# Patient Record
Sex: Female | Born: 1935 | Race: White | Hispanic: No | Marital: Married | Smoking: Never smoker
Health system: Southern US, Community
[De-identification: ages and names within clinical notes are randomized; demographics above are authoritative.]

## PROBLEM LIST (undated history)

## (undated) DIAGNOSIS — I10 Essential (primary) hypertension: Secondary | ICD-10-CM

## (undated) DIAGNOSIS — K635 Polyp of colon: Secondary | ICD-10-CM

## (undated) DIAGNOSIS — F410 Panic disorder [episodic paroxysmal anxiety] without agoraphobia: Secondary | ICD-10-CM

## (undated) DIAGNOSIS — E785 Hyperlipidemia, unspecified: Secondary | ICD-10-CM

## (undated) DIAGNOSIS — Z5189 Encounter for other specified aftercare: Secondary | ICD-10-CM

## (undated) DIAGNOSIS — K219 Gastro-esophageal reflux disease without esophagitis: Secondary | ICD-10-CM

## (undated) DIAGNOSIS — S060XAA Concussion with loss of consciousness status unknown, initial encounter: Secondary | ICD-10-CM

## (undated) DIAGNOSIS — M1990 Unspecified osteoarthritis, unspecified site: Secondary | ICD-10-CM

## (undated) DIAGNOSIS — T8859XA Other complications of anesthesia, initial encounter: Secondary | ICD-10-CM

## (undated) DIAGNOSIS — E119 Type 2 diabetes mellitus without complications: Secondary | ICD-10-CM

## (undated) DIAGNOSIS — Z9889 Other specified postprocedural states: Secondary | ICD-10-CM

## (undated) DIAGNOSIS — R112 Nausea with vomiting, unspecified: Secondary | ICD-10-CM

## (undated) DIAGNOSIS — S060X9A Concussion with loss of consciousness of unspecified duration, initial encounter: Secondary | ICD-10-CM

## (undated) DIAGNOSIS — K449 Diaphragmatic hernia without obstruction or gangrene: Secondary | ICD-10-CM

## (undated) DIAGNOSIS — T4145XA Adverse effect of unspecified anesthetic, initial encounter: Secondary | ICD-10-CM

## (undated) DIAGNOSIS — IMO0001 Reserved for inherently not codable concepts without codable children: Secondary | ICD-10-CM

## (undated) DIAGNOSIS — I82409 Acute embolism and thrombosis of unspecified deep veins of unspecified lower extremity: Secondary | ICD-10-CM

## (undated) HISTORY — PX: CT SCAN: SHX5351

## (undated) HISTORY — DX: Diaphragmatic hernia without obstruction or gangrene: K44.9

## (undated) HISTORY — PX: ESOPHAGOGASTRODUODENOSCOPY: SHX1529

## (undated) HISTORY — DX: Encounter for other specified aftercare: Z51.89

## (undated) HISTORY — DX: Gastro-esophageal reflux disease without esophagitis: K21.9

## (undated) HISTORY — DX: Polyp of colon: K63.5

## (undated) HISTORY — PX: COLONOSCOPY: SHX174

## (undated) HISTORY — PX: OTHER SURGICAL HISTORY: SHX169

## (undated) HISTORY — PX: BREAST EXCISIONAL BIOPSY: SUR124

## (undated) HISTORY — DX: Concussion with loss of consciousness of unspecified duration, initial encounter: S06.0X9A

## (undated) HISTORY — DX: Reserved for inherently not codable concepts without codable children: IMO0001

## (undated) HISTORY — DX: Essential (primary) hypertension: I10

## (undated) HISTORY — DX: Type 2 diabetes mellitus without complications: E11.9

## (undated) HISTORY — PX: SHOULDER SURGERY: SHX246

## (undated) HISTORY — DX: Acute embolism and thrombosis of unspecified deep veins of unspecified lower extremity: I82.409

## (undated) HISTORY — DX: Concussion with loss of consciousness status unknown, initial encounter: S06.0XAA

## (undated) HISTORY — DX: Unspecified osteoarthritis, unspecified site: M19.90

## (undated) HISTORY — PX: DILATION AND CURETTAGE OF UTERUS: SHX78

## (undated) HISTORY — DX: Panic disorder (episodic paroxysmal anxiety): F41.0

## (undated) HISTORY — DX: Hyperlipidemia, unspecified: E78.5

---
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
## 1964-02-10 HISTORY — PX: BREAST MASS EXCISION: SHX1267

## 1974-02-09 HISTORY — PX: CATARACT EXTRACTION W/ INTRAOCULAR LENS  IMPLANT, BILATERAL: SHX1307

## 1977-02-09 HISTORY — PX: KNEE SURGERY: SHX244

## 1977-02-09 HISTORY — PX: CHOLECYSTECTOMY: SHX55

## 1982-02-09 HISTORY — PX: TUBAL LIGATION: SHX77

## 1985-02-09 HISTORY — PX: ABDOMINAL HYSTERECTOMY: SHX81

## 1987-02-10 HISTORY — PX: ENUCLEATION: SHX628

## 1995-02-10 HISTORY — PX: WRIST FRACTURE SURGERY: SHX121

## 2000-02-10 HISTORY — PX: FOOT SURGERY: SHX648

## 2001-06-27 ENCOUNTER — Encounter: Payer: Self-pay | Admitting: Orthopedic Surgery

## 2001-06-27 ENCOUNTER — Ambulatory Visit (HOSPITAL_COMMUNITY): Admission: RE | Admit: 2001-06-27 | Discharge: 2001-06-27 | Payer: Self-pay | Admitting: Orthopedic Surgery

## 2001-06-30 ENCOUNTER — Encounter: Payer: Self-pay | Admitting: Orthopedic Surgery

## 2001-06-30 ENCOUNTER — Ambulatory Visit (HOSPITAL_COMMUNITY): Admission: RE | Admit: 2001-06-30 | Discharge: 2001-06-30 | Payer: Self-pay | Admitting: Orthopedic Surgery

## 2001-08-04 ENCOUNTER — Encounter: Admission: RE | Admit: 2001-08-04 | Discharge: 2001-08-04 | Payer: Self-pay | Admitting: Unknown Physician Specialty

## 2001-08-04 ENCOUNTER — Encounter: Payer: Self-pay | Admitting: Unknown Physician Specialty

## 2002-09-04 ENCOUNTER — Encounter: Admission: RE | Admit: 2002-09-04 | Discharge: 2002-09-04 | Payer: Self-pay | Admitting: Unknown Physician Specialty

## 2002-09-04 ENCOUNTER — Encounter: Payer: Self-pay | Admitting: Unknown Physician Specialty

## 2003-02-10 HISTORY — PX: OTHER SURGICAL HISTORY: SHX169

## 2003-06-20 ENCOUNTER — Ambulatory Visit (HOSPITAL_COMMUNITY): Admission: RE | Admit: 2003-06-20 | Discharge: 2003-06-20 | Payer: Self-pay | Admitting: Orthopedic Surgery

## 2003-07-10 ENCOUNTER — Ambulatory Visit (HOSPITAL_BASED_OUTPATIENT_CLINIC_OR_DEPARTMENT_OTHER): Admission: RE | Admit: 2003-07-10 | Discharge: 2003-07-10 | Payer: Self-pay | Admitting: Orthopedic Surgery

## 2003-09-10 ENCOUNTER — Encounter: Admission: RE | Admit: 2003-09-10 | Discharge: 2003-09-10 | Payer: Self-pay | Admitting: Unknown Physician Specialty

## 2003-11-10 HISTORY — PX: MOHS SURGERY: SUR867

## 2004-09-17 ENCOUNTER — Encounter: Admission: RE | Admit: 2004-09-17 | Discharge: 2004-09-17 | Payer: Self-pay | Admitting: Unknown Physician Specialty

## 2005-09-18 ENCOUNTER — Encounter: Admission: RE | Admit: 2005-09-18 | Discharge: 2005-09-18 | Payer: Self-pay | Admitting: Unknown Physician Specialty

## 2006-07-30 ENCOUNTER — Ambulatory Visit (HOSPITAL_COMMUNITY): Admission: RE | Admit: 2006-07-30 | Discharge: 2006-07-30 | Payer: Self-pay | Admitting: Family Medicine

## 2006-08-10 HISTORY — PX: OTHER SURGICAL HISTORY: SHX169

## 2006-08-11 ENCOUNTER — Ambulatory Visit (HOSPITAL_COMMUNITY): Admission: RE | Admit: 2006-08-11 | Discharge: 2006-08-11 | Payer: Self-pay | Admitting: Family Medicine

## 2006-09-20 ENCOUNTER — Encounter: Admission: RE | Admit: 2006-09-20 | Discharge: 2006-09-20 | Payer: Self-pay | Admitting: Family Medicine

## 2007-09-21 ENCOUNTER — Encounter: Admission: RE | Admit: 2007-09-21 | Discharge: 2007-09-21 | Payer: Self-pay | Admitting: Family Medicine

## 2008-09-21 ENCOUNTER — Encounter: Admission: RE | Admit: 2008-09-21 | Discharge: 2008-09-21 | Payer: Self-pay | Admitting: Family Medicine

## 2009-11-06 ENCOUNTER — Encounter: Admission: RE | Admit: 2009-11-06 | Discharge: 2009-11-06 | Payer: Self-pay | Admitting: Obstetrics and Gynecology

## 2010-03-02 ENCOUNTER — Encounter: Payer: Self-pay | Admitting: Orthopedic Surgery

## 2010-03-12 ENCOUNTER — Encounter: Payer: Self-pay | Admitting: Family Medicine

## 2010-06-27 NOTE — Op Note (Signed)
NAME:  Brianna Burns, Brianna Burns                         ACCOUNT NO.:  [id number]   MEDICAL RECORD NO.:  [id number]                   PATIENT TYPE:  AMB   LOCATION:  DSC                                  FACILITY:  [facility]   PHYSICIAN:  Katy Fitch. Naaman Plummer., M.D.          DATE OF BIRTH:  August 20, 1934   DATE OF PROCEDURE:  07/09/2003  DATE OF DISCHARGE:                                 OPERATIVE REPORT   PREOPERATIVE DIAGNOSIS:  Chronic pain, left shoulder due to adhesive  capsulitis.   POSTOPERATIVE DIAGNOSIS:  Chronic pain, left shoulder due to adhesive  capsulitis.   OPERATION PERFORMED:  Examination of left shoulder under anesthesia followed  by capsulolysis by manipulation and injection of 2% lidocaine and Depo-  Medrol 40 mg/mL, total of 4 mL mixed 3 lidocaine to 1 Depo-Medrol.   SURGEON:  Katy Fitch. Sypher, M.D.   ANESTHESIA:  General by mask.   SUPERVISING ANESTHESIOLOGIST:  Dr. Ivin Booty.   INDICATIONS FOR PROCEDURE:  Brianna Burns is a 76 year old woman referred by  Colon Flattery, D.O. for evaluation and management of a painful left shoulder.  She was an established patient with [facility].  She  presented with predicament of a chronically painful and stiff left shoulder.  Clinical examination revealed signs of adhesive capsulitis and impingement.  An MRI of the shoulder documented an intact rotator cuff with chronic  adhesive capsulitis.  Due to failure to respond to nonoperative measures,  the patient is brought to the operating room at this time anticipating  manipulation of her shoulder and injection.  Prior to surgery questions were  invited and answered.   DESCRIPTION OF PROCEDURE:  Brianna Burns was brought to the operating room  and placed in supine position on the operating table.  Following induction  of general anesthesia with IV technique followed by inhalation anesthesia  with a mask, the left shoulder was examined under anesthesia.  She could  elevated  approximately 125 degrees, externally rotate it approximately 40  degrees and internally rotate 20 degrees.  With gentle manipulation, her  capsule was lysed with recovery of elevation 175, external rotation at 90  degrees abduction of 95 degrees and internal rotation at 90 degrees  abduction of 85 degrees.  Her glenohumeral joint was stable.  The joint was  then injected with a mixture of 3 mL of 2% plain lidocaine and Depo-Medrol  40 mg per mL.  This was injected through an anterior approach medial to the  long head of the biceps.  3 mL were injected within the joint and 1 mL in  the subacromial space.  There were no apparent complications.  Brianna Burns was  awakened from anesthesia and transferred to recovery room with stable vital  signs.   She will be discharged to home with prescriptions for Darvocet-N 100 24  tablets one by mouth every four to six hours as needed for pain, also she is  advised to use Advil as needed.                                               Katy Fitch Naaman Plummer., M.D.   RVS/MEDQ  D:  07/10/2003  T:  07/10/2003  Job:  [id number]

## 2010-09-29 ENCOUNTER — Other Ambulatory Visit: Payer: Self-pay | Admitting: Family Medicine

## 2010-09-29 DIAGNOSIS — Z1231 Encounter for screening mammogram for malignant neoplasm of breast: Secondary | ICD-10-CM

## 2010-11-10 ENCOUNTER — Ambulatory Visit
Admission: RE | Admit: 2010-11-10 | Discharge: 2010-11-10 | Disposition: A | Discharge status: Home / Self Care | Payer: Medicare Other | Source: Ambulatory Visit | Attending: Family Medicine | Admitting: Family Medicine

## 2010-11-10 DIAGNOSIS — Z1231 Encounter for screening mammogram for malignant neoplasm of breast: Secondary | ICD-10-CM

## 2011-08-28 ENCOUNTER — Encounter: Payer: Self-pay | Admitting: Gastroenterology

## 2011-10-02 ENCOUNTER — Encounter: Payer: Self-pay | Admitting: Gastroenterology

## 2011-10-02 ENCOUNTER — Other Ambulatory Visit: Payer: Self-pay | Admitting: Family Medicine

## 2011-10-02 DIAGNOSIS — Z1231 Encounter for screening mammogram for malignant neoplasm of breast: Secondary | ICD-10-CM

## 2011-11-02 ENCOUNTER — Ambulatory Visit (AMBULATORY_SURGERY_CENTER): Payer: Medicare Other | Admitting: *Deleted

## 2011-11-02 ENCOUNTER — Encounter: Payer: Self-pay | Admitting: Gastroenterology

## 2011-11-02 VITALS — Ht 62.5 in | Wt 179.9 lb

## 2011-11-02 DIAGNOSIS — Z1211 Encounter for screening for malignant neoplasm of colon: Secondary | ICD-10-CM

## 2011-11-02 MED ORDER — NA SULFATE-K SULFATE-MG SULF 17.5-3.13-1.6 GM/177ML PO SOLN: ORAL | Status: DC

## 2011-11-11 ENCOUNTER — Ambulatory Visit (AMBULATORY_SURGERY_CENTER): Payer: Medicare Other | Admitting: Gastroenterology

## 2011-11-11 ENCOUNTER — Encounter: Payer: Self-pay | Admitting: Gastroenterology

## 2011-11-11 VITALS — BP 142/79 | HR 71 | Temp 97.0°F | Resp 15 | Ht 62.5 in | Wt 179.0 lb

## 2011-11-11 DIAGNOSIS — D126 Benign neoplasm of colon, unspecified: Secondary | ICD-10-CM

## 2011-11-11 DIAGNOSIS — Z1211 Encounter for screening for malignant neoplasm of colon: Secondary | ICD-10-CM

## 2011-11-11 MED ORDER — SODIUM CHLORIDE 0.9 % IV SOLN: 500.0000 mL | INTRAVENOUS | Status: DC

## 2011-11-11 NOTE — Patient Instructions (Signed)
Multiple flat polyps removed today and hemorrhoids seen. See handouts given. Resume current medications today. Result letter in your mail in 2-3 weeks. Call us with any questions or concerns. Thank you!!  YOU HAD AN ENDOSCOPIC PROCEDURE TODAY AT THE [facility]: Refer to the procedure report that was given to you for any specific questions about what was found during the examination.  If the procedure report does not answer your questions, please call your gastroenterologist to clarify.  If you requested that your care partner not be given the details of your procedure findings, then the procedure report has been included in a sealed envelope for you to review at your convenience later.  YOU SHOULD EXPECT: Some feelings of bloating in the abdomen. Passage of more gas than usual.  Walking can help get rid of the air that was put into your GI tract during the procedure and reduce the bloating. If you had a lower endoscopy (such as a colonoscopy or flexible sigmoidoscopy) you may notice spotting of blood in your stool or on the toilet paper. If you underwent a bowel prep for your procedure, then you may not have a normal bowel movement for a few days.  DIET: Your first meal following the procedure should be a light meal and then it is ok to progress to your normal diet.  A half-sandwich or bowl of soup is an example of a good first meal.  Heavy or fried foods are harder to digest and may make you feel nauseous or bloated.  Likewise meals heavy in dairy and vegetables can cause extra gas to form and this can also increase the bloating.  Drink plenty of fluids but you should avoid alcoholic beverages for 24 hours.  ACTIVITY: Your care partner should take you home directly after the procedure.  You should plan to take it easy, moving slowly for the rest of the day.  You can resume normal activity the day after the procedure however you should NOT DRIVE or use heavy machinery for 24 hours (because of  the sedation medicines used during the test).    SYMPTOMS TO REPORT IMMEDIATELY: A gastroenterologist can be reached at any hour.  During normal business hours, 8:30 AM to 5:00 PM Monday through Friday, call [phone].  After hours and on weekends, please call the GI answering service at [phone] who will take a message and have the physician on call contact you.   Following lower endoscopy (colonoscopy or flexible sigmoidoscopy):  Excessive amounts of blood in the stool  Significant tenderness or worsening of abdominal pains  Swelling of the abdomen that is new, acute  Fever of 100F or higher  FOLLOW UP: If any biopsies were taken you will be contacted by phone or by letter within the next 1-3 weeks.  Call your gastroenterologist if you have not heard about the biopsies in 3 weeks.  Our staff will call the home number listed on your records the next business day following your procedure to check on you and address any questions or concerns that you may have at that time regarding the information given to you following your procedure. This is a courtesy call and so if there is no answer at the home number and we have not heard from you through the emergency physician on call, we will assume that you have returned to your regular daily activities without incident.  SIGNATURES/CONFIDENTIALITY: You and/or your care partner have signed paperwork which will be entered into your electronic  medical record.  These signatures attest to the fact that that the information above on your After Visit Summary has been reviewed and is understood.  Full responsibility of the confidentiality of this discharge information lies with you and/or your care-partner.  

## 2011-11-11 NOTE — Op Note (Signed)
[facility] [address] [place], [address]   COLONOSCOPY PROCEDURE REPORT  PATIENT: Brianna Burns, Brianna Burns  MR#: [id number] BIRTHDATE: 04-25-1934 , 77  yrs. old GENDER: Female ENDOSCOPIST: Louis Meckel, MD REFERRED [id number] Christell Constant, M.D. PROCEDURE DATE:  11/11/2011 PROCEDURE:   Colonoscopy with snare polypectomy ASA CLASS:   Class II INDICATIONS:average risk screening. MEDICATIONS: MAC sedation, administered by CRNA and propofol (Diprivan) 100mg  IV  DESCRIPTION OF PROCEDURE:   After the risks benefits and alternatives of the procedure were thoroughly explained, informed consent was obtained.  A digital rectal exam revealed no abnormalities of the rectum.   The LB CF-H180AL [id number]  endoscope was introduced through the anus and advanced to the cecum, which was identified by both the appendix and ileocecal valve. No adverse events experienced.   The quality of the prep was Suprep excellent The instrument was then slowly withdrawn as the colon was fully examined.      COLON FINDINGS: Multiple flat polyps were found at the cecum.  There was a cluster of very flat polyps in the cecum measuring approximately 15 mm.   Piecemeal olypectomy was performed using cold snare.  All resections were complete and all polyp tissue was completely retrieved.   Internal hemorrhoids were found.   The colon mucosa was otherwise normal.  Retroflexed views revealed no abnormalities. The time to cecum=3 minutes 22 seconds.  Withdrawal time=9 minutes 25 seconds.  The scope was withdrawn and the procedure completed. COMPLICATIONS: There were no complications.  ENDOSCOPIC IMPRESSION: 1.   Multiple flat polyps were found at the cecum; Polypectomy was performed using cold snare 2.   Internal hemorrhoids 3.   The colon mucosa was otherwise normal  RECOMMENDATIONS: If the polyp(s) removed today are proven to be adenomatous (pre-cancerous) polyps, you will need a colonoscopy in 3  years. Otherwise you should continue to follow colorectal cancer screening guidelines for "routine risk" patients with a colonoscopy in 10 years.  You will receive a letter within 1-2 weeks with the results of your biopsy as well as final recommendations.  Please call my office if you have not received a letter after 3 weeks.   eSigned:  Louis Meckel, MD 11/11/2011 11:39 AM   cc:

## 2011-11-11 NOTE — Progress Notes (Signed)
Patient did not experience any of the following events: a burn prior to discharge; a fall within the facility; wrong site/side/patient/procedure/implant event; or a hospital transfer or hospital admission upon discharge from the facility. (G8907) Patient did not have preoperative order for IV antibiotic SSI prophylaxis. (G8918)  

## 2011-11-12 ENCOUNTER — Ambulatory Visit
Admission: RE | Admit: 2011-11-12 | Discharge: 2011-11-12 | Disposition: A | Discharge status: Home / Self Care | Payer: Medicare Other | Source: Ambulatory Visit | Attending: Family Medicine | Admitting: Family Medicine

## 2011-11-12 ENCOUNTER — Telehealth: Payer: Self-pay | Admitting: *Deleted

## 2011-11-12 DIAGNOSIS — Z1231 Encounter for screening mammogram for malignant neoplasm of breast: Secondary | ICD-10-CM

## 2011-11-12 NOTE — Telephone Encounter (Signed)
  Follow up Call-  Call back number 11/11/2011  Post procedure Call Back phone  # [phone]  Permission to leave phone message Yes     Patient questions:  Do you have a fever, pain , or abdominal swelling? no Pain Score  0 *  Have you tolerated food without any problems? yes  Have you been able to return to your normal activities? yes  Do you have any questions about your discharge instructions: Diet   no Medications  no Follow up visit  no  Do you have questions or concerns about your Care? no  Actions: * If pain score is 4 or above: No action needed, pain <4.

## 2011-11-16 ENCOUNTER — Encounter: Payer: Self-pay | Admitting: Gastroenterology

## 2012-06-16 ENCOUNTER — Other Ambulatory Visit (INDEPENDENT_AMBULATORY_CARE_PROVIDER_SITE_OTHER): Payer: Medicare Other

## 2012-06-16 DIAGNOSIS — E119 Type 2 diabetes mellitus without complications: Secondary | ICD-10-CM

## 2012-06-16 LAB — POCT UA - MICROALBUMIN: Microalbumin Ur, POC: NEGATIVE mg/dL

## 2012-06-16 NOTE — Progress Notes (Unsigned)
Patient came in for labs only.

## 2012-06-27 ENCOUNTER — Ambulatory Visit (INDEPENDENT_AMBULATORY_CARE_PROVIDER_SITE_OTHER): Payer: Medicare Other | Admitting: Physician Assistant

## 2012-06-27 ENCOUNTER — Encounter: Payer: Self-pay | Admitting: Physician Assistant

## 2012-06-27 VITALS — BP 126/68 | HR 97 | Temp 98.0°F

## 2012-06-27 DIAGNOSIS — I10 Essential (primary) hypertension: Secondary | ICD-10-CM

## 2012-06-27 DIAGNOSIS — R351 Nocturia: Secondary | ICD-10-CM | POA: Insufficient documentation

## 2012-06-27 DIAGNOSIS — K219 Gastro-esophageal reflux disease without esophagitis: Secondary | ICD-10-CM

## 2012-06-27 DIAGNOSIS — E119 Type 2 diabetes mellitus without complications: Secondary | ICD-10-CM

## 2012-06-27 MED ORDER — IMIPRAMINE HCL 25 MG PO TABS: 25.0000 mg | ORAL_TABLET | Freq: Every day | ORAL | Status: DC

## 2012-06-27 MED ORDER — METFORMIN HCL 500 MG PO TABS: 500.0000 mg | ORAL_TABLET | Freq: Every day | ORAL | Status: DC

## 2012-06-27 MED ORDER — LOSARTAN POTASSIUM 25 MG PO TABS: 25.0000 mg | ORAL_TABLET | Freq: Every day | ORAL | Status: DC

## 2012-06-27 NOTE — Progress Notes (Signed)
Subjective:     Patient ID: Brianna Burns, female   DOB: [birth year], 78 y.o.   MRN: [id number]  Diabetes She presents for her follow-up diabetic visit. She has type 1 diabetes mellitus. No MedicAlert identification noted. The initial diagnosis of diabetes was made 9 months ago. Her disease course has been improving. There are no hypoglycemic associated symptoms. There are no diabetic associated symptoms. There are no hypoglycemic complications. Diabetic complications include PVD. Risk factors for coronary artery disease include family history, hypertension and sedentary lifestyle. Current diabetic treatment includes oral agent (monotherapy). She is compliant with treatment all of the time. Her weight is stable. She is following a low fat/cholesterol and diabetic diet. Meal planning includes avoidance of concentrated sweets. She has not had a previous visit with a dietician. She rarely participates in exercise. She monitors blood glucose at home 3-4 x per week. Blood glucose monitoring compliance is good. Her home blood glucose trend is decreasing steadily. Her overall blood glucose range is 110-130 mg/dl. An ACE inhibitor/angiotensin II receptor blocker is being taken. She does not see a podiatrist.Eye exam is current.     Review of Systems  All other systems reviewed and are negative.       Objective:   Physical Exam  Nursing note reviewed. Constitutional: She appears well-developed and well-nourished.  Neck: Neck supple. No JVD present.  Cardiovascular: Normal rate, regular rhythm and normal heart sounds.   Pulmonary/Chest: Effort normal and breath sounds normal.  Lymphadenopathy:    She has no cervical adenopathy.  Skin: Skin is warm and dry.  Lower ext w/o edema or ulcerations     Assessment:     NIDDM    Plan:     Cont curent meds A1C cont to improve Meds rf today F/U in 6 mos Pt has reg f/u with Opth

## 2012-06-27 NOTE — Patient Instructions (Signed)

## 2012-07-08 ENCOUNTER — Other Ambulatory Visit: Payer: Self-pay

## 2012-07-08 NOTE — Telephone Encounter (Signed)
Print for mail order  Last seen 06/27/12

## 2012-07-09 MED ORDER — GLUCOSE BLOOD VI STRP: ORAL_STRIP | Status: DC

## 2012-07-11 NOTE — Telephone Encounter (Signed)
Pt notified RX ready for pick up RX to front 

## 2012-08-09 HISTORY — PX: KNEE ARTHROSCOPY: SUR90

## 2012-10-17 ENCOUNTER — Other Ambulatory Visit: Payer: Self-pay

## 2012-10-17 DIAGNOSIS — Z1231 Encounter for screening mammogram for malignant neoplasm of breast: Secondary | ICD-10-CM

## 2012-11-22 ENCOUNTER — Ambulatory Visit
Admission: RE | Admit: 2012-11-22 | Discharge: 2012-11-22 | Disposition: A | Discharge status: Home / Self Care | Payer: Medicare Other | Source: Ambulatory Visit

## 2012-11-22 DIAGNOSIS — Z1231 Encounter for screening mammogram for malignant neoplasm of breast: Secondary | ICD-10-CM

## 2012-12-21 ENCOUNTER — Encounter (INDEPENDENT_AMBULATORY_CARE_PROVIDER_SITE_OTHER): Payer: Self-pay

## 2012-12-21 ENCOUNTER — Ambulatory Visit (INDEPENDENT_AMBULATORY_CARE_PROVIDER_SITE_OTHER): Payer: Medicare Other

## 2012-12-21 DIAGNOSIS — Z23 Encounter for immunization: Secondary | ICD-10-CM

## 2013-02-13 ENCOUNTER — Ambulatory Visit (INDEPENDENT_AMBULATORY_CARE_PROVIDER_SITE_OTHER): Payer: Medicare Other | Admitting: Family Medicine

## 2013-02-13 ENCOUNTER — Encounter: Payer: Self-pay | Admitting: Family Medicine

## 2013-02-13 VITALS — BP 138/68 | HR 72 | Temp 96.6°F | Ht 62.5 in

## 2013-02-13 DIAGNOSIS — E119 Type 2 diabetes mellitus without complications: Secondary | ICD-10-CM

## 2013-02-13 DIAGNOSIS — M810 Age-related osteoporosis without current pathological fracture: Secondary | ICD-10-CM

## 2013-02-13 DIAGNOSIS — Z23 Encounter for immunization: Secondary | ICD-10-CM

## 2013-02-13 DIAGNOSIS — I10 Essential (primary) hypertension: Secondary | ICD-10-CM

## 2013-02-13 LAB — POCT UA - MICROALBUMIN: MICROALBUMIN (UR) POC: NEGATIVE mg/L

## 2013-02-13 LAB — POCT GLYCOSYLATED HEMOGLOBIN (HGB A1C): Hemoglobin A1C: 6.6

## 2013-02-13 MED ORDER — LOSARTAN POTASSIUM 25 MG PO TABS: 25.0000 mg | ORAL_TABLET | Freq: Every day | ORAL | Status: DC

## 2013-02-13 MED ORDER — METFORMIN HCL 500 MG PO TABS: 500.0000 mg | ORAL_TABLET | Freq: Every day | ORAL | Status: DC

## 2013-02-13 MED ORDER — OMEPRAZOLE 40 MG PO CPDR: 40.0000 mg | DELAYED_RELEASE_CAPSULE | Freq: Every day | ORAL | Status: AC

## 2013-02-13 NOTE — Patient Instructions (Signed)

## 2013-02-13 NOTE — Progress Notes (Signed)
   Subjective:    Patient ID: Brianna Burns, female    DOB: Apr 27, 1934, 79 y.o.   MRN: [id number]  HPI Pt presents today for general follow up  No acute issues or concerns.  Baseline diabetic.  Overall well controlled. On monotherapy. Last A1C 6.4.  Checks blood sugar intermittently through the week.  On low glycemic index diet.  BPs stable at home.  No CP or SOB.   Patient Active Problem List   Diagnosis Date Noted  . HTN (hypertension) 06/27/2012  . Diabetes 06/27/2012  . GERD (gastroesophageal reflux disease) 06/27/2012  . Nocturia 06/27/2012   Current Outpatient Prescriptions on File Prior to Visit  Medication Sig Dispense Refill  . ACCU-CHEK FASTCLIX LANCETS MISC       . aspirin 81 MG tablet Take 81 mg by mouth daily.      . Blood Glucose Calibration (ACCU-CHEK AVIVA) SOLN       . Calcium Carbonate-Vitamin D (CALCIUM + D PO) Take by mouth daily.      Marland Kitchen glucose blood (ACCU-CHEK AVIVA PLUS) test strip Dx 250.00  Check sugar 1-2 times daily  100 each  1  . Lancets Misc. (ACCU-CHEK FASTCLIX LANCET) KIT       . losartan (COZAAR) 25 MG tablet Take 1 tablet (25 mg total) by mouth daily.  90 tablet  1  . metFORMIN (GLUCOPHAGE) 500 MG tablet Take 1 tablet (500 mg total) by mouth daily with breakfast.  90 tablet  1  . omeprazole (PRILOSEC) 40 MG capsule Take 40 mg by mouth daily.       . Cholecalciferol (VITAMIN D PO) Take by mouth daily.       No current facility-administered medications on file prior to visit.      Review of Systems  All other systems reviewed and are negative.       Objective:   Physical Exam  Constitutional: She is oriented to person, place, and time. She appears well-developed and well-nourished.  HENT:  Head: Normocephalic and atraumatic.  Right Ear: External ear normal.  L ear partial cerumen impaction    Eyes: Conjunctivae are normal. Pupils are equal, round, and reactive to light.  Neck: Normal range of motion. Neck supple.  Cardiovascular:  Normal rate and regular rhythm.   Pulmonary/Chest: Effort normal and breath sounds normal.  Abdominal: Soft.  Musculoskeletal: Normal range of motion.  Neurological: She is alert and oriented to person, place, and time.  Skin: Skin is warm.   Filed Vitals:   02/13/13 0937  BP: 138/68  Pulse: 72  Temp: 96.6 F (35.9 C)      Assessment & Plan:  DM (diabetes mellitus) - Plan: NMR, lipoprofile  HTN (hypertension) - Plan: POCT glycosylated hemoglobin (Hb A1C), POCT UA - Microalbumin, CMP14+EGFR  Osteoporosis, unspecified - Plan: DG Bone Density  Need for prophylactic vaccination against Streptococcus pneumoniae (pneumococcus) - Plan: Pneumococcal conjugate vaccine 13-valent  Check A1C, lipoprofile, CMET, urine micro Will need podiatry and ophtho referral.  Pneumovax today.  DEXA  Meds refilled  Follow up in 6 months pending labs.

## 2013-02-14 LAB — CMP14+EGFR
A/G RATIO: 2 (ref 1.1–2.5)
ALBUMIN: 4.7 g/dL (ref 3.5–4.8)
ALT: 20 IU/L (ref 0–32)
AST: 19 IU/L (ref 0–40)
Alkaline Phosphatase: 62 IU/L (ref 39–117)
BUN/Creatinine Ratio: 23 (ref 11–26)
BUN: 26 mg/dL (ref 8–27)
CO2: 25 mmol/L (ref 18–29)
CREATININE: 1.14 mg/dL — AB (ref 0.57–1.00)
Calcium: 10.1 mg/dL (ref 8.6–10.2)
Chloride: 99 mmol/L (ref 97–108)
GFR, EST AFRICAN AMERICAN: 54 mL/min/{1.73_m2} — AB (ref >59)
GFR, EST NON AFRICAN AMERICAN: 47 mL/min/{1.73_m2} — AB (ref >59)
GLUCOSE: 143 mg/dL — AB (ref 65–99)
Globulin, Total: 2.4 g/dL (ref 1.5–4.5)
Potassium: 4.7 mmol/L (ref 3.5–5.2)
Sodium: 143 mmol/L (ref 134–144)
TOTAL PROTEIN: 7.1 g/dL (ref 6.0–8.5)
Total Bilirubin: 0.4 mg/dL (ref 0.0–1.2)

## 2013-02-14 LAB — NMR, LIPOPROFILE
CHOLESTEROL: 216 mg/dL — AB (ref <200)
HDL Cholesterol by NMR: 62 mg/dL (ref >=40)
HDL Particle Number: 39.5 umol/L (ref >=30.5)
LDL PARTICLE NUMBER: 1587 nmol/L — AB (ref <1000)
LDL SIZE: 20.8 nm (ref >20.5)
LDLC SERPL CALC-MCNC: 121 mg/dL — ABNORMAL HIGH (ref <100)
LP-IR Score: 54 — ABNORMAL HIGH (ref <=45)
SMALL LDL PARTICLE NUMBER: 762 nmol/L — AB (ref <=527)
TRIGLYCERIDES BY NMR: 167 mg/dL — AB (ref <150)

## 2013-02-14 LAB — SPECIMEN STATUS REPORT

## 2013-02-23 ENCOUNTER — Telehealth: Payer: Self-pay | Admitting: Family Medicine

## 2013-02-27 NOTE — Telephone Encounter (Signed)
Please review labwork 

## 2013-02-28 ENCOUNTER — Other Ambulatory Visit: Payer: Self-pay | Admitting: Family Medicine

## 2013-02-28 MED ORDER — PRAVASTATIN SODIUM 20 MG PO TABS: 20.0000 mg | ORAL_TABLET | Freq: Every day | ORAL | Status: DC

## 2013-03-22 ENCOUNTER — Ambulatory Visit: Payer: Medicare Other

## 2013-03-22 ENCOUNTER — Other Ambulatory Visit: Payer: Medicare Other

## 2013-04-05 ENCOUNTER — Telehealth: Payer: Self-pay | Admitting: Family Medicine

## 2013-04-06 NOTE — Telephone Encounter (Signed)
That needs to go to ophthalmology.  Also, pt needs repeat BMET

## 2013-04-10 NOTE — Telephone Encounter (Signed)
Patient is aware 

## 2013-08-29 ENCOUNTER — Ambulatory Visit: Payer: Medicare Other | Attending: Orthopedic Surgery | Admitting: Physical Therapy

## 2013-08-29 DIAGNOSIS — M25669 Stiffness of unspecified knee, not elsewhere classified: Secondary | ICD-10-CM | POA: Insufficient documentation

## 2013-08-29 DIAGNOSIS — M25569 Pain in unspecified knee: Secondary | ICD-10-CM | POA: Diagnosis not present

## 2013-08-29 DIAGNOSIS — R5381 Other malaise: Secondary | ICD-10-CM | POA: Insufficient documentation

## 2013-08-29 DIAGNOSIS — I10 Essential (primary) hypertension: Secondary | ICD-10-CM | POA: Insufficient documentation

## 2013-08-29 DIAGNOSIS — Z9889 Other specified postprocedural states: Secondary | ICD-10-CM | POA: Insufficient documentation

## 2013-08-29 DIAGNOSIS — H5440 Blindness, one eye, unspecified eye: Secondary | ICD-10-CM | POA: Diagnosis not present

## 2013-08-29 DIAGNOSIS — IMO0001 Reserved for inherently not codable concepts without codable children: Secondary | ICD-10-CM | POA: Insufficient documentation

## 2013-08-29 DIAGNOSIS — E119 Type 2 diabetes mellitus without complications: Secondary | ICD-10-CM | POA: Insufficient documentation

## 2013-08-31 ENCOUNTER — Ambulatory Visit: Payer: Medicare Other | Admitting: Physical Therapy

## 2013-08-31 DIAGNOSIS — IMO0001 Reserved for inherently not codable concepts without codable children: Secondary | ICD-10-CM | POA: Diagnosis not present

## 2013-09-04 ENCOUNTER — Ambulatory Visit: Payer: Medicare Other | Admitting: Physical Therapy

## 2013-09-04 DIAGNOSIS — IMO0001 Reserved for inherently not codable concepts without codable children: Secondary | ICD-10-CM | POA: Diagnosis not present

## 2013-09-07 ENCOUNTER — Ambulatory Visit: Payer: Medicare Other | Admitting: Physical Therapy

## 2013-09-07 DIAGNOSIS — IMO0001 Reserved for inherently not codable concepts without codable children: Secondary | ICD-10-CM | POA: Diagnosis not present

## 2013-09-11 ENCOUNTER — Ambulatory Visit: Payer: Medicare Other | Attending: Orthopedic Surgery | Admitting: Physical Therapy

## 2013-09-11 DIAGNOSIS — E119 Type 2 diabetes mellitus without complications: Secondary | ICD-10-CM | POA: Insufficient documentation

## 2013-09-11 DIAGNOSIS — H5440 Blindness, one eye, unspecified eye: Secondary | ICD-10-CM | POA: Insufficient documentation

## 2013-09-11 DIAGNOSIS — M25569 Pain in unspecified knee: Secondary | ICD-10-CM | POA: Diagnosis not present

## 2013-09-11 DIAGNOSIS — I10 Essential (primary) hypertension: Secondary | ICD-10-CM | POA: Diagnosis not present

## 2013-09-11 DIAGNOSIS — Z9889 Other specified postprocedural states: Secondary | ICD-10-CM | POA: Diagnosis not present

## 2013-09-11 DIAGNOSIS — M25669 Stiffness of unspecified knee, not elsewhere classified: Secondary | ICD-10-CM | POA: Insufficient documentation

## 2013-09-11 DIAGNOSIS — IMO0001 Reserved for inherently not codable concepts without codable children: Secondary | ICD-10-CM | POA: Diagnosis not present

## 2013-09-11 DIAGNOSIS — R5381 Other malaise: Secondary | ICD-10-CM | POA: Insufficient documentation

## 2013-09-14 ENCOUNTER — Ambulatory Visit: Payer: Medicare Other | Admitting: Physical Therapy

## 2013-09-14 DIAGNOSIS — IMO0001 Reserved for inherently not codable concepts without codable children: Secondary | ICD-10-CM | POA: Diagnosis not present

## 2013-09-18 ENCOUNTER — Ambulatory Visit: Payer: Medicare Other | Admitting: Physical Therapy

## 2013-09-18 DIAGNOSIS — IMO0001 Reserved for inherently not codable concepts without codable children: Secondary | ICD-10-CM | POA: Diagnosis not present

## 2013-09-21 ENCOUNTER — Ambulatory Visit: Payer: Medicare Other | Admitting: Physical Therapy

## 2013-09-21 DIAGNOSIS — IMO0001 Reserved for inherently not codable concepts without codable children: Secondary | ICD-10-CM | POA: Diagnosis not present

## 2013-09-25 ENCOUNTER — Encounter: Payer: Medicare Other | Admitting: Physical Therapy

## 2013-09-28 ENCOUNTER — Ambulatory Visit: Payer: Medicare Other | Admitting: Physical Therapy

## 2013-09-28 DIAGNOSIS — IMO0001 Reserved for inherently not codable concepts without codable children: Secondary | ICD-10-CM | POA: Diagnosis not present

## 2013-10-02 ENCOUNTER — Ambulatory Visit: Payer: Medicare Other | Admitting: Physical Therapy

## 2013-10-02 DIAGNOSIS — IMO0001 Reserved for inherently not codable concepts without codable children: Secondary | ICD-10-CM | POA: Diagnosis not present

## 2013-10-05 ENCOUNTER — Ambulatory Visit: Payer: Medicare Other | Admitting: Physical Therapy

## 2013-10-05 DIAGNOSIS — IMO0001 Reserved for inherently not codable concepts without codable children: Secondary | ICD-10-CM | POA: Diagnosis not present

## 2013-10-09 ENCOUNTER — Ambulatory Visit: Payer: Medicare Other | Admitting: Physical Therapy

## 2013-10-09 DIAGNOSIS — IMO0001 Reserved for inherently not codable concepts without codable children: Secondary | ICD-10-CM | POA: Diagnosis not present

## 2013-10-18 ENCOUNTER — Other Ambulatory Visit: Payer: Self-pay

## 2013-10-18 DIAGNOSIS — Z1231 Encounter for screening mammogram for malignant neoplasm of breast: Secondary | ICD-10-CM

## 2013-11-28 ENCOUNTER — Ambulatory Visit
Admission: RE | Admit: 2013-11-28 | Discharge: 2013-11-28 | Disposition: A | Discharge status: Home / Self Care | Payer: Medicare Other | Source: Ambulatory Visit

## 2013-11-28 DIAGNOSIS — Z1231 Encounter for screening mammogram for malignant neoplasm of breast: Secondary | ICD-10-CM

## 2013-11-30 ENCOUNTER — Other Ambulatory Visit: Payer: Self-pay | Admitting: Family Medicine

## 2013-11-30 DIAGNOSIS — R928 Other abnormal and inconclusive findings on diagnostic imaging of breast: Secondary | ICD-10-CM

## 2013-12-05 ENCOUNTER — Telehealth: Payer: Self-pay | Admitting: Pharmacist

## 2013-12-05 NOTE — Telephone Encounter (Signed)
Received notice from patient's insurance - [employer] regarding imipramine.  It is recommended that the benefits of imipramine out weight the potential risk in elderly patients. This medication was not prescribed by a provider at our office however I noticed that patient has not been seen in our office since January 2015.  Tried to call to make appt or find out if she is seeing another provider.  No Ans - LMOVM that I was just calling to check on her and to offer a follow up appointment / [place] appointment.

## 2013-12-14 ENCOUNTER — Ambulatory Visit
Admission: RE | Admit: 2013-12-14 | Discharge: 2013-12-14 | Disposition: A | Discharge status: Home / Self Care | Payer: Medicare Other | Source: Ambulatory Visit | Attending: Family Medicine | Admitting: Family Medicine

## 2013-12-14 DIAGNOSIS — R928 Other abnormal and inconclusive findings on diagnostic imaging of breast: Secondary | ICD-10-CM

## 2014-02-09 DIAGNOSIS — K449 Diaphragmatic hernia without obstruction or gangrene: Secondary | ICD-10-CM

## 2014-02-09 HISTORY — DX: Diaphragmatic hernia without obstruction or gangrene: K44.9

## 2014-03-23 ENCOUNTER — Other Ambulatory Visit: Payer: Self-pay | Admitting: Family Medicine

## 2014-03-23 DIAGNOSIS — N6310 Unspecified lump in the right breast, unspecified quadrant: Secondary | ICD-10-CM

## 2014-06-13 ENCOUNTER — Ambulatory Visit
Admission: RE | Admit: 2014-06-13 | Discharge: 2014-06-13 | Disposition: A | Discharge status: Home / Self Care | Payer: Medicare Other | Source: Ambulatory Visit | Attending: Family Medicine | Admitting: Family Medicine

## 2014-06-13 ENCOUNTER — Encounter (INDEPENDENT_AMBULATORY_CARE_PROVIDER_SITE_OTHER): Payer: Self-pay

## 2014-06-13 ENCOUNTER — Other Ambulatory Visit: Payer: Self-pay | Admitting: Family Medicine

## 2014-06-13 DIAGNOSIS — N6310 Unspecified lump in the right breast, unspecified quadrant: Secondary | ICD-10-CM

## 2014-06-13 DIAGNOSIS — N6001 Solitary cyst of right breast: Secondary | ICD-10-CM | POA: Diagnosis not present

## 2014-06-29 DIAGNOSIS — K219 Gastro-esophageal reflux disease without esophagitis: Secondary | ICD-10-CM | POA: Diagnosis not present

## 2014-06-29 DIAGNOSIS — R32 Unspecified urinary incontinence: Secondary | ICD-10-CM | POA: Diagnosis not present

## 2014-06-29 DIAGNOSIS — Z0000 Encounter for general adult medical examination without abnormal findings: Secondary | ICD-10-CM | POA: Diagnosis not present

## 2014-06-29 DIAGNOSIS — E1165 Type 2 diabetes mellitus with hyperglycemia: Secondary | ICD-10-CM | POA: Diagnosis not present

## 2014-06-29 DIAGNOSIS — I8011 Phlebitis and thrombophlebitis of right femoral vein: Secondary | ICD-10-CM | POA: Diagnosis not present

## 2014-06-29 DIAGNOSIS — I10 Essential (primary) hypertension: Secondary | ICD-10-CM | POA: Diagnosis not present

## 2014-07-05 DIAGNOSIS — E119 Type 2 diabetes mellitus without complications: Secondary | ICD-10-CM | POA: Diagnosis not present

## 2014-07-05 DIAGNOSIS — M7989 Other specified soft tissue disorders: Secondary | ICD-10-CM | POA: Diagnosis not present

## 2014-07-05 DIAGNOSIS — M79661 Pain in right lower leg: Secondary | ICD-10-CM | POA: Diagnosis not present

## 2014-07-05 DIAGNOSIS — M79662 Pain in left lower leg: Secondary | ICD-10-CM | POA: Diagnosis not present

## 2014-07-05 DIAGNOSIS — Z86718 Personal history of other venous thrombosis and embolism: Secondary | ICD-10-CM | POA: Diagnosis not present

## 2014-07-05 DIAGNOSIS — I10 Essential (primary) hypertension: Secondary | ICD-10-CM | POA: Diagnosis not present

## 2014-07-09 DIAGNOSIS — M79662 Pain in left lower leg: Secondary | ICD-10-CM | POA: Diagnosis not present

## 2014-07-09 DIAGNOSIS — I825Z3 Chronic embolism and thrombosis of unspecified deep veins of distal lower extremity, bilateral: Secondary | ICD-10-CM | POA: Diagnosis not present

## 2014-07-09 DIAGNOSIS — M79661 Pain in right lower leg: Secondary | ICD-10-CM | POA: Diagnosis not present

## 2014-07-09 DIAGNOSIS — M7989 Other specified soft tissue disorders: Secondary | ICD-10-CM | POA: Diagnosis not present

## 2014-10-08 ENCOUNTER — Encounter: Payer: Self-pay | Admitting: *Deleted

## 2014-10-23 ENCOUNTER — Encounter: Payer: Self-pay | Admitting: Gastroenterology

## 2014-10-26 ENCOUNTER — Other Ambulatory Visit: Payer: Self-pay

## 2014-10-26 DIAGNOSIS — Z1231 Encounter for screening mammogram for malignant neoplasm of breast: Secondary | ICD-10-CM

## 2014-12-05 ENCOUNTER — Encounter: Payer: Self-pay | Admitting: Gastroenterology

## 2014-12-19 ENCOUNTER — Ambulatory Visit
Admission: RE | Admit: 2014-12-19 | Discharge: 2014-12-19 | Disposition: A | Discharge status: Home / Self Care | Payer: Medicare Other | Source: Ambulatory Visit

## 2014-12-19 DIAGNOSIS — Z1231 Encounter for screening mammogram for malignant neoplasm of breast: Secondary | ICD-10-CM

## 2015-02-01 ENCOUNTER — Ambulatory Visit (AMBULATORY_SURGERY_CENTER): Payer: Self-pay | Admitting: *Deleted

## 2015-02-01 VITALS — Ht 63.0 in | Wt 176.0 lb

## 2015-02-01 DIAGNOSIS — Z86010 Personal history of colonic polyps: Secondary | ICD-10-CM

## 2015-02-01 MED ORDER — NA SULFATE-K SULFATE-MG SULF 17.5-3.13-1.6 GM/177ML PO SOLN: 1.0000 | Freq: Once | ORAL | Status: DC

## 2015-02-01 NOTE — Progress Notes (Signed)
No egg or soy allergy known to patient  No issues with past sedation with any surgeries  or procedures, no intubation problems but she vomits with every single sedation. She states if she is given zofran she does not vomit No diet pills per patient  No home 02 use per patient

## 2015-02-13 ENCOUNTER — Telehealth: Payer: Self-pay | Admitting: Gastroenterology

## 2015-02-13 NOTE — Telephone Encounter (Signed)
Sister told pt she cannot have gel tips on. She has clear polish on her nails. Instructed patient this is fine, we just ask for no red or dark polish which she doesn't have. Pt verbalized understanding  Brianna Burns

## 2015-02-15 ENCOUNTER — Ambulatory Visit (AMBULATORY_SURGERY_CENTER): Payer: Medicare Other | Admitting: Gastroenterology

## 2015-02-15 ENCOUNTER — Encounter: Payer: Self-pay | Admitting: Gastroenterology

## 2015-02-15 VITALS — BP 123/79 | HR 84 | Temp 96.8°F | Resp 15 | Ht 63.0 in | Wt 176.0 lb

## 2015-02-15 DIAGNOSIS — D120 Benign neoplasm of cecum: Secondary | ICD-10-CM | POA: Diagnosis not present

## 2015-02-15 DIAGNOSIS — Z86010 Personal history of colonic polyps: Secondary | ICD-10-CM | POA: Diagnosis present

## 2015-02-15 DIAGNOSIS — D123 Benign neoplasm of transverse colon: Secondary | ICD-10-CM

## 2015-02-15 MED ORDER — SODIUM CHLORIDE 0.9 % IV SOLN: 500.0000 mL | INTRAVENOUS | Status: DC

## 2015-02-15 NOTE — Op Note (Signed)
[name]  [address]. [place], [address]   COLONOSCOPY PROCEDURE REPORT  PATIENT: Brianna Burns, Brianna Burns  MR#: [id number] BIRTHDATE: [birth year] , 80  yrs. old GENDER: female ENDOSCOPIST: Harl Bowie, MD REFERRED [id number] Laurance Flatten, M.D. PROCEDURE DATE:  02/15/2015 PROCEDURE:   Colonoscopy, surveillance , Colonoscopy with cold biopsy polypectomy, and Colonoscopy with snare polypectomy First Screening Colonoscopy - Avg.  risk and is 50 yrs.  old or older - No.  Prior Negative Screening - Now for repeat screening. N/A  History of Adenoma - Now for follow-up colonoscopy & has been > or = to 3 yrs.  Yes hx of adenoma.  Has been 3 or more years since last colonoscopy.  Polyps removed today? Yes ASA CLASS:   Class III INDICATIONS:Surveillance due to prior colonic neoplasia and PH Colon Adenoma. MEDICATIONS: Propofol 150 mg IV, Zofran 4mg  IV , and Versed 1 mg IV   DESCRIPTION OF PROCEDURE:   After the risks benefits and alternatives of the procedure were thoroughly explained, informed consent was obtained.  The digital rectal exam revealed no abnormalities of the rectum.   The LB PFC-H190 [id number]  endoscope was introduced through the anus and advanced to the cecum, which was identified by both the appendix and ileocecal valve. No adverse events experienced.   The quality of the prep was good.  The instrument was then slowly withdrawn as the colon was fully examined. Estimated blood loss is zero unless otherwise noted in this procedure report.   COLON FINDINGS: Two sessile polyps ranging between 5-68mm in size were found at the cecum.  Polypectomies were performed with a cold snare.  The resection was complete, the polyp tissue was completely retrieved and sent to histology.   Two sessile polyps ranging between 3-12mm in size were found at the hepatic flexure and cecum. A polypectomy was performed with cold forceps.  The resection was complete, the polyp tissue was  completely retrieved and sent to histology.   Small internal hemorrhoids were found.  Retroflexed views revealed internal hemorrhoids. The time to cecum = 5.8 Withdrawal time = 17.6   The scope was withdrawn and the procedure completed. COMPLICATIONS: There were no immediate complications.  ENDOSCOPIC IMPRESSION: 1.   Two sessile polyps ranging between 5-32mm in size were found at the cecum; polypectomies were performed with a cold snare 2.   Two sessile polyps ranging between 3-81mm in size were found at the hepatic flexure and cecum; polypectomy was performed with cold forceps 3.   Small internal hemorrhoids  RECOMMENDATIONS: 1.  Given your age, you will not need another colonoscopy for colon cancer screening or polyp surveillance.  These types of tests usually stop around the age 56. 2.  Await pathology results  eSigned:  Harl Bowie, MD 02/15/2015 9:54 AM      PATIENT NAME:  [name], [name] MR#: [id number]

## 2015-02-15 NOTE — Progress Notes (Signed)
approx 930 during abd pressure to assist to cecum pt known to be wretcvhing, head dropped into t burg and suction ready, less than 50 cc bile like fluid suctioned out.  Pt sedation stopped and when pt became more alert the situation was explained, lowest sat was 96

## 2015-02-15 NOTE — Patient Instructions (Signed)
YOU HAD AN ENDOSCOPIC PROCEDURE TODAY AT [facility]:   Refer to the procedure report that was given to you for any specific questions about what was found during the examination.  If the procedure report does not answer your questions, please call your gastroenterologist to clarify.  If you requested that your care partner not be given the details of your procedure findings, then the procedure report has been included in a sealed envelope for you to review at your convenience later.  YOU SHOULD EXPECT: Some feelings of bloating in the abdomen. Passage of more gas than usual.  Walking can help get rid of the air that was put into your GI tract during the procedure and reduce the bloating. If you had a lower endoscopy (such as a colonoscopy or flexible sigmoidoscopy) you may notice spotting of blood in your stool or on the toilet paper. If you underwent a bowel prep for your procedure, you may not have a normal bowel movement for a few days.  Please Note:  You might notice some irritation and congestion in your nose or some drainage.  This is from the oxygen used during your procedure.  There is no need for concern and it should clear up in a day or so.  SYMPTOMS TO REPORT IMMEDIATELY:   Following lower endoscopy (colonoscopy or flexible sigmoidoscopy):  Excessive amounts of blood in the stool  Significant tenderness or worsening of abdominal pains  Swelling of the abdomen that is new, acute  Fever of 100F or higher  For urgent or emergent issues, a gastroenterologist can be reached at any hour by calling [phone].   DIET: Your first meal following the procedure should be a small meal and then it is ok to progress to your normal diet. Heavy or fried foods are harder to digest and may make you feel nauseous or bloated.  Likewise, meals heavy in dairy and vegetables can increase bloating.  Drink plenty of fluids but you should avoid alcoholic beverages for 24  hours.  ACTIVITY:  You should plan to take it easy for the rest of today and you should NOT DRIVE or use heavy machinery until tomorrow (because of the sedation medicines used during the test).    FOLLOW UP: Our staff will call the number listed on your records the next business day following your procedure to check on you and address any questions or concerns that you may have regarding the information given to you following your procedure. If we do not reach you, we will leave a message.  However, if you are feeling well and you are not experiencing any problems, there is no need to return our call.  We will assume that you have returned to your regular daily activities without incident.  If any biopsies were taken you will be contacted by phone or by letter within the next 1-3 weeks.  Please call us at [phone] if you have not heard about the biopsies in 3 weeks.    SIGNATURES/CONFIDENTIALITY: You and/or your care partner have signed paperwork which will be entered into your electronic medical record.  These signatures attest to the fact that that the information above on your After Visit Summary has been reviewed and is understood.  Full responsibility of the confidentiality of this discharge information lies with you and/or your care-partner.   Handouts were given to your care partner on polyps, hemorrhoids, and a high fiber diet with liberal fluid intake. Your blood sugar was 122  in the recovery room. You may resume your current medications today. Await biopsy results. Please call if any questions or concerns.

## 2015-02-15 NOTE — Progress Notes (Signed)
1 mg versed and 4 zofran ordered by DrN.

## 2015-02-15 NOTE — Progress Notes (Signed)
No problems noted in the recovery room. maw 

## 2015-02-15 NOTE — Progress Notes (Signed)
Called to room to assist during endoscopic procedure.  Patient ID and intended procedure confirmed with present staff. Received instructions for my participation in the procedure from the performing physician.  

## 2015-02-15 NOTE — Progress Notes (Signed)
Report to PACU, RN, vss, BBS= Clear.  PACU RN notified of room events

## 2015-02-19 ENCOUNTER — Telehealth: Payer: Self-pay

## 2015-02-19 NOTE — Telephone Encounter (Signed)
  Follow up Call-  Call back number 02/15/2015  Post procedure Call Back phone  # [phone]  Permission to leave phone message Yes    Patient was called for follow up after procedure on 02/15/2015. No answer at the number given. A message was left on the answering machine.

## 2015-02-26 ENCOUNTER — Encounter: Payer: Self-pay | Admitting: Gastroenterology

## 2015-11-18 ENCOUNTER — Other Ambulatory Visit: Payer: Self-pay | Admitting: Family Medicine

## 2015-11-18 DIAGNOSIS — Z1231 Encounter for screening mammogram for malignant neoplasm of breast: Secondary | ICD-10-CM

## 2015-12-12 ENCOUNTER — Other Ambulatory Visit: Payer: Self-pay | Admitting: Family Medicine

## 2015-12-12 DIAGNOSIS — N6459 Other signs and symptoms in breast: Secondary | ICD-10-CM

## 2015-12-20 ENCOUNTER — Ambulatory Visit: Payer: Medicare Other

## 2015-12-20 ENCOUNTER — Ambulatory Visit
Admission: RE | Admit: 2015-12-20 | Discharge: 2015-12-20 | Disposition: A | Discharge status: Home / Self Care | Payer: Medicare Other | Source: Ambulatory Visit | Attending: Family Medicine | Admitting: Family Medicine

## 2015-12-20 DIAGNOSIS — N6459 Other signs and symptoms in breast: Secondary | ICD-10-CM

## 2015-12-26 ENCOUNTER — Other Ambulatory Visit: Payer: Self-pay | Admitting: Family Medicine

## 2015-12-26 DIAGNOSIS — N6452 Nipple discharge: Secondary | ICD-10-CM

## 2015-12-26 DIAGNOSIS — N6459 Other signs and symptoms in breast: Secondary | ICD-10-CM

## 2016-01-09 ENCOUNTER — Ambulatory Visit
Admission: RE | Admit: 2016-01-09 | Discharge: 2016-01-09 | Disposition: A | Discharge status: Home / Self Care | Payer: Medicare Other | Source: Ambulatory Visit | Attending: Family Medicine | Admitting: Family Medicine

## 2016-01-09 DIAGNOSIS — N6459 Other signs and symptoms in breast: Secondary | ICD-10-CM

## 2016-01-09 DIAGNOSIS — N6452 Nipple discharge: Secondary | ICD-10-CM

## 2016-01-09 MED ORDER — GADOBENATE DIMEGLUMINE 529 MG/ML IV SOLN
16.0000 mL | Freq: Once | INTRAVENOUS | Status: AC | PRN
  Administered 2016-01-09: 16 mL via INTRAVENOUS

## 2016-10-15 HISTORY — PX: BACK SURGERY: SHX140

## 2016-11-04 ENCOUNTER — Other Ambulatory Visit: Payer: Self-pay | Admitting: Family Medicine

## 2016-11-04 DIAGNOSIS — Z1231 Encounter for screening mammogram for malignant neoplasm of breast: Secondary | ICD-10-CM

## 2017-01-11 ENCOUNTER — Ambulatory Visit
Admission: RE | Admit: 2017-01-11 | Discharge: 2017-01-11 | Disposition: A | Discharge status: Home / Self Care | Payer: Medicare Other | Source: Ambulatory Visit | Attending: Family Medicine | Admitting: Family Medicine

## 2017-01-11 DIAGNOSIS — Z1231 Encounter for screening mammogram for malignant neoplasm of breast: Secondary | ICD-10-CM

## 2017-03-05 ENCOUNTER — Other Ambulatory Visit: Payer: Self-pay | Admitting: Family Medicine

## 2017-03-05 ENCOUNTER — Ambulatory Visit
Admission: RE | Admit: 2017-03-05 | Discharge: 2017-03-05 | Disposition: A | Discharge status: Home / Self Care | Payer: Medicare Other | Source: Ambulatory Visit | Attending: Family Medicine | Admitting: Family Medicine

## 2017-03-05 DIAGNOSIS — R059 Cough, unspecified: Secondary | ICD-10-CM

## 2017-03-05 DIAGNOSIS — R05 Cough: Secondary | ICD-10-CM

## 2017-08-31 ENCOUNTER — Ambulatory Visit: Payer: Medicare Other | Attending: Orthopedic Surgery | Admitting: Physical Therapy

## 2017-08-31 ENCOUNTER — Encounter: Payer: Self-pay | Admitting: Physical Therapy

## 2017-08-31 DIAGNOSIS — R262 Difficulty in walking, not elsewhere classified: Secondary | ICD-10-CM

## 2017-08-31 DIAGNOSIS — M545 Low back pain: Secondary | ICD-10-CM | POA: Diagnosis not present

## 2017-08-31 DIAGNOSIS — G8929 Other chronic pain: Secondary | ICD-10-CM

## 2017-08-31 DIAGNOSIS — R2681 Unsteadiness on feet: Secondary | ICD-10-CM
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
## 2017-09-01 ENCOUNTER — Ambulatory Visit: Payer: Medicare Other | Admitting: Physical Therapy

## 2017-09-03 ENCOUNTER — Ambulatory Visit: Payer: Medicare Other | Admitting: Physical Therapy

## 2017-09-03 DIAGNOSIS — M545 Low back pain: Principal | ICD-10-CM

## 2017-09-03 DIAGNOSIS — R262 Difficulty in walking, not elsewhere classified: Secondary | ICD-10-CM

## 2017-09-03 DIAGNOSIS — G8929 Other chronic pain: Secondary | ICD-10-CM

## 2017-09-03 DIAGNOSIS — R2681 Unsteadiness on feet: Secondary | ICD-10-CM
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
## 2017-09-06 ENCOUNTER — Ambulatory Visit: Payer: Medicare Other | Admitting: Physical Therapy

## 2017-09-06 ENCOUNTER — Encounter: Payer: Self-pay | Admitting: Physical Therapy

## 2017-09-06 DIAGNOSIS — M545 Low back pain: Principal | ICD-10-CM

## 2017-09-06 DIAGNOSIS — R262 Difficulty in walking, not elsewhere classified: Secondary | ICD-10-CM

## 2017-09-06 DIAGNOSIS — G8929 Other chronic pain: Secondary | ICD-10-CM

## 2017-09-06 DIAGNOSIS — R2681 Unsteadiness on feet: Secondary | ICD-10-CM
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
## 2017-09-08 ENCOUNTER — Ambulatory Visit: Payer: Medicare Other | Admitting: Physical Therapy

## 2017-09-08 DIAGNOSIS — G8929 Other chronic pain: Secondary | ICD-10-CM

## 2017-09-08 DIAGNOSIS — M545 Low back pain: Secondary | ICD-10-CM | POA: Diagnosis not present

## 2017-09-08 DIAGNOSIS — R262 Difficulty in walking, not elsewhere classified: Secondary | ICD-10-CM

## 2017-09-08 DIAGNOSIS — R2681 Unsteadiness on feet: Secondary | ICD-10-CM
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
## 2017-09-13 ENCOUNTER — Encounter: Payer: Self-pay | Admitting: Physical Therapy

## 2017-09-13 ENCOUNTER — Ambulatory Visit: Payer: Medicare Other | Attending: Orthopedic Surgery | Admitting: Physical Therapy

## 2017-09-13 DIAGNOSIS — R2681 Unsteadiness on feet: Secondary | ICD-10-CM | POA: Insufficient documentation

## 2017-09-13 DIAGNOSIS — M545 Low back pain: Secondary | ICD-10-CM | POA: Insufficient documentation

## 2017-09-13 DIAGNOSIS — G8929 Other chronic pain: Secondary | ICD-10-CM | POA: Diagnosis present

## 2017-09-13 DIAGNOSIS — R262 Difficulty in walking, not elsewhere classified: Secondary | ICD-10-CM

## 2017-09-13 NOTE — Patient Instructions (Signed)
Knee High   Holding stable object, raise knee to hip level, then lower knee. Repeat with other knee. Complete __10-30_ repetitions. Do __2__ sessions per day.  ABDUCTION: Standing (Active)   Stand, feet flat. Lift right leg out to side. Use _0__ lbs. Complete __10-30_ repetitions. Perform __2_ sessions per day.    EXTENSION: Standing (Active)  Stand, both feet flat. Draw right leg behind body as far as possible. Use 0___ lbs. Complete 10-30 repetitions. Perform __2_ sessions per day.     Straight Leg Raise  Tighten stomach and slowly raise locked right leg __4__ inches from floor. Repeat __10-30__ times per set. Do __2__ sets per session. Do __2__ sessions per day.  Bridging  Slowly raise buttocks from floor, keeping stomach tight. Repeat _10___ times per set. Do __2__ sets per session. Do __2__ sessions per day.     Scapular Retraction: Bilateral  Facing anchor, pull arms back, bringing shoulder blades together. Repeat _30___ times per set. Do __2-3__ sets per session. Do _2___ sessions per day.    Standing lat pull with theraband  Anchor bands higher than your head. Start with your arms straight out in front of you at shoulder height (or a little above).  Pull bands down next to your body and then slowly return to the starting position.  10-30 x1day
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
## 2017-09-16 ENCOUNTER — Ambulatory Visit: Payer: Medicare Other | Admitting: Physical Therapy

## 2017-09-16 ENCOUNTER — Encounter: Payer: Self-pay | Admitting: Physical Therapy

## 2017-09-16 DIAGNOSIS — R2681 Unsteadiness on feet: Secondary | ICD-10-CM

## 2017-09-16 DIAGNOSIS — R262 Difficulty in walking, not elsewhere classified: Secondary | ICD-10-CM

## 2017-09-16 DIAGNOSIS — M545 Low back pain: Secondary | ICD-10-CM | POA: Diagnosis not present

## 2017-09-16 DIAGNOSIS — G8929 Other chronic pain: Secondary | ICD-10-CM
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
## 2017-12-02 ENCOUNTER — Other Ambulatory Visit: Payer: Self-pay | Admitting: Family Medicine

## 2017-12-02 DIAGNOSIS — Z1231 Encounter for screening mammogram for malignant neoplasm of breast: Secondary | ICD-10-CM

## 2017-12-26 DIAGNOSIS — I2699 Other pulmonary embolism without acute cor pulmonale: Secondary | ICD-10-CM

## 2017-12-26 HISTORY — DX: Other pulmonary embolism without acute cor pulmonale: I26.99

## 2018-01-14 ENCOUNTER — Ambulatory Visit: Payer: Medicare Other

## 2018-02-23 ENCOUNTER — Ambulatory Visit: Payer: Medicare Other

## 2018-03-17 ENCOUNTER — Encounter: Payer: Self-pay | Admitting: Gastroenterology

## 2018-12-16 ENCOUNTER — Other Ambulatory Visit: Payer: Self-pay | Admitting: *Deleted

## 2018-12-17 ENCOUNTER — Other Ambulatory Visit (HOSPITAL_COMMUNITY)
Admission: RE | Admit: 2018-12-17 | Discharge: 2018-12-17 | Disposition: A | Discharge status: Home / Self Care | Payer: Medicare Other | Source: Ambulatory Visit | Attending: Vascular Surgery | Admitting: Vascular Surgery

## 2018-12-17 DIAGNOSIS — Z01812 Encounter for preprocedural laboratory examination: Secondary | ICD-10-CM | POA: Insufficient documentation

## 2018-12-17 DIAGNOSIS — Z20828 Contact with and (suspected) exposure to other viral communicable diseases: Secondary | ICD-10-CM | POA: Insufficient documentation

## 2018-12-17 LAB — SARS CORONAVIRUS 2 (TAT 6-24 HRS): SARS Coronavirus 2: NEGATIVE

## 2018-12-19 ENCOUNTER — Encounter (HOSPITAL_COMMUNITY)
Admission: RE | Disposition: A | Discharge status: Home / Self Care | Payer: Self-pay | Source: Home / Self Care | Attending: Vascular Surgery

## 2018-12-19 ENCOUNTER — Other Ambulatory Visit: Payer: Self-pay

## 2018-12-19 ENCOUNTER — Ambulatory Visit: Payer: Self-pay | Admitting: Orthopedic Surgery

## 2018-12-19 ENCOUNTER — Ambulatory Visit (HOSPITAL_COMMUNITY)
Admission: RE | Admit: 2018-12-19 | Discharge: 2018-12-19 | Disposition: A | Discharge status: Home / Self Care | Payer: Medicare Other | Attending: Vascular Surgery | Admitting: Vascular Surgery

## 2018-12-19 DIAGNOSIS — E785 Hyperlipidemia, unspecified: Secondary | ICD-10-CM | POA: Diagnosis not present

## 2018-12-19 DIAGNOSIS — Z7982 Long term (current) use of aspirin: Secondary | ICD-10-CM | POA: Insufficient documentation

## 2018-12-19 DIAGNOSIS — Z86718 Personal history of other venous thrombosis and embolism: Secondary | ICD-10-CM | POA: Insufficient documentation

## 2018-12-19 DIAGNOSIS — Z885 Allergy status to narcotic agent status: Secondary | ICD-10-CM | POA: Diagnosis not present

## 2018-12-19 DIAGNOSIS — Z79899 Other long term (current) drug therapy: Secondary | ICD-10-CM | POA: Insufficient documentation

## 2018-12-19 DIAGNOSIS — Z7901 Long term (current) use of anticoagulants: Secondary | ICD-10-CM | POA: Insufficient documentation

## 2018-12-19 DIAGNOSIS — E119 Type 2 diabetes mellitus without complications: Secondary | ICD-10-CM | POA: Insufficient documentation

## 2018-12-19 DIAGNOSIS — I10 Essential (primary) hypertension: Secondary | ICD-10-CM | POA: Insufficient documentation

## 2018-12-19 DIAGNOSIS — Z7984 Long term (current) use of oral hypoglycemic drugs: Secondary | ICD-10-CM | POA: Insufficient documentation

## 2018-12-19 DIAGNOSIS — K219 Gastro-esophageal reflux disease without esophagitis: Secondary | ICD-10-CM | POA: Insufficient documentation

## 2018-12-19 DIAGNOSIS — S82001A Unspecified fracture of right patella, initial encounter for closed fracture: Secondary | ICD-10-CM

## 2018-12-19 HISTORY — PX: IVC FILTER INSERTION: CATH118245

## 2018-12-19 LAB — POCT I-STAT, CHEM 8
BUN: 26 mg/dL — ABNORMAL HIGH (ref 8–23)
Calcium, Ion: 1.28 mmol/L (ref 1.15–1.40)
Chloride: 99 mmol/L (ref 98–111)
Creatinine, Ser: 1.1 mg/dL — ABNORMAL HIGH (ref 0.44–1.00)
Glucose, Bld: 115 mg/dL — ABNORMAL HIGH (ref 70–99)
HCT: 36 % (ref 36.0–46.0)
Hemoglobin: 12.2 g/dL (ref 12.0–15.0)
Potassium: 4.3 mmol/L (ref 3.5–5.1)
Sodium: 138 mmol/L (ref 135–145)
TCO2: 29 mmol/L (ref 22–32)

## 2018-12-19 SURGERY — IVC FILTER INSERTION
Anesthesia: LOCAL

## 2018-12-19 MED ORDER — ONDANSETRON HCL 4 MG/2ML IJ SOLN: 4.0000 mg | Freq: Four times a day (QID) | INTRAMUSCULAR | Status: DC | PRN

## 2018-12-19 MED ORDER — IODIXANOL 320 MG/ML IV SOLN
INTRAVENOUS | Status: DC | PRN
  Administered 2018-12-19: 20 mL via INTRAVENOUS

## 2018-12-19 MED ORDER — ASPIRIN EC 81 MG PO TBEC: 81.0000 mg | DELAYED_RELEASE_TABLET | Freq: Every day | ORAL | Status: DC

## 2018-12-19 MED ORDER — LABETALOL HCL 5 MG/ML IV SOLN: 10.0000 mg | INTRAVENOUS | Status: DC | PRN

## 2018-12-19 MED ORDER — SODIUM CHLORIDE 0.9 % IV SOLN: 250.0000 mL | INTRAVENOUS | Status: DC | PRN

## 2018-12-19 MED ORDER — SODIUM CHLORIDE 0.9% FLUSH: 3.0000 mL | INTRAVENOUS | Status: DC | PRN

## 2018-12-19 MED ORDER — LIDOCAINE HCL (PF) 1 % IJ SOLN
INTRAMUSCULAR | Status: AC
  Filled 2018-12-19: qty 30

## 2018-12-19 MED ORDER — HYDRALAZINE HCL 20 MG/ML IJ SOLN: 5.0000 mg | INTRAMUSCULAR | Status: DC | PRN

## 2018-12-19 MED ORDER — HEPARIN (PORCINE) IN NACL 1000-0.9 UT/500ML-% IV SOLN
INTRAVENOUS | Status: DC | PRN
  Administered 2018-12-19: 500 mL

## 2018-12-19 MED ORDER — SODIUM CHLORIDE 0.9 % IV SOLN
INTRAVENOUS | Status: DC
  Administered 2018-12-19: 13:00:00 via INTRAVENOUS

## 2018-12-19 MED ORDER — HEPARIN (PORCINE) IN NACL 1000-0.9 UT/500ML-% IV SOLN
INTRAVENOUS | Status: AC
  Filled 2018-12-19: qty 500

## 2018-12-19 MED ORDER — SODIUM CHLORIDE 0.9% FLUSH: 3.0000 mL | Freq: Two times a day (BID) | INTRAVENOUS | Status: DC

## 2018-12-19 MED ORDER — SODIUM CHLORIDE 0.9 % IV SOLN: INTRAVENOUS | Status: DC

## 2018-12-19 MED ORDER — LIDOCAINE HCL (PF) 1 % IJ SOLN
INTRAMUSCULAR | Status: DC | PRN
  Administered 2018-12-19: 10 mL via INTRADERMAL

## 2018-12-19 SURGICAL SUPPLY — 6 items
FILTER VC CELECT-FEMORAL (Filter) ×2 IMPLANT
PROTECTION STATION PRESSURIZED (MISCELLANEOUS) ×2
SHEATH PROBE COVER 6X72 (BAG) ×2 IMPLANT
STATION PROTECTION PRESSURIZED (MISCELLANEOUS) ×1 IMPLANT
TRAY PV CATH (CUSTOM PROCEDURE TRAY) ×2 IMPLANT
WIRE BENTSON .035X145CM (WIRE) ×2 IMPLANT

## 2018-12-19 NOTE — Discharge Instructions (Signed)
Femoral Site Care This sheet gives you information about how to care for yourself after your procedure. Your health care provider may also give you more specific instructions. If you have problems or questions, contact your health care provider. What can I expect after the procedure? After the procedure, it is common to have:  Bruising that usually fades within 1-2 weeks.  Tenderness at the site. Follow these instructions at home: Wound care  Follow instructions from your health care provider about how to take care of your insertion site. Make sure you: ? Wash your hands with soap and water before you change your bandage (dressing). If soap and water are not available, use hand sanitizer. ? Change your dressing as told by your health care provider. ? Leave stitches (sutures), skin glue, or adhesive strips in place. These skin closures may need to stay in place for 2 weeks or longer. If adhesive strip edges start to loosen and curl up, you may trim the loose edges. Do not remove adhesive strips completely unless your health care provider tells you to do that.  Do not take baths, swim, or use a hot tub until your health care provider approves.  You may shower 24-48 hours after the procedure or as told by your health care provider. ? Gently wash the site with plain soap and water. ? Pat the area dry with a clean towel. ? Do not rub the site. This may cause bleeding.  Do not apply powder or lotion to the site. Keep the site clean and dry.  Check your femoral site every day for signs of infection. Check for: ? Redness, swelling, or pain. ? Fluid or blood. ? Warmth. ? Pus or a bad smell. Activity  For the first 2-3 days after your procedure, or as long as directed: ? Avoid climbing stairs as much as possible. ? Do not squat.  Do not lift anything that is heavier than 10 lb (4.5 kg), or the limit that you are told, until your health care provider says that it is safe.  Rest as  directed. ? Avoid sitting for a long time without moving. Get up to take short walks every 1-2 hours.  Do not drive for 24 hours if you were given a medicine to help you relax (sedative). General instructions  Take over-the-counter and prescription medicines only as told by your health care provider.  Keep all follow-up visits as told by your health care provider. This is important. Contact a health care provider if you have:  A fever or chills.  You have redness, swelling, or pain around your insertion site. Get help right away if:  The catheter insertion area swells very fast.  You pass out.  You suddenly start to sweat or your skin gets clammy.  The catheter insertion area is bleeding, and the bleeding does not stop when you hold steady pressure on the area.  The area near or just beyond the catheter insertion site becomes pale, cool, tingly, or numb. These symptoms may represent a serious problem that is an emergency. Do not wait to see if the symptoms will go away. Get medical help right away. Call your local emergency services (911 in the [place] Do not drive yourself to the hospital. Summary  After the procedure, it is common to have bruising that usually fades within 1-2 weeks.  Check your femoral site every day for signs of infection.  Do not lift anything that is heavier than 10 lb (4.5 kg),  or the limit that you are told, until your health care provider says that it is safe. This information is not intended to replace advice given to you by your health care provider. Make sure you discuss any questions you have with your health care provider. Document Released: 09/29/2013 Document Revised: 02/08/2017 Document Reviewed: 02/08/2017 Elsevier Patient Education  [place].   Inferior Vena Cava Filter Insertion, Care After This sheet gives you information about how to care for yourself after your procedure. Your health care provider may also give you more specific  instructions. If you have problems or questions, contact your health care provider. What can I expect after the procedure? After your procedure, it is common to have:  Mild pain in the area where the filter was inserted.  Mild bruising in the area where the filter was inserted. Follow these instructions at home: Insertion site care   Follow instructions from your health care provider about how to take care of the site where a catheter was inserted at your neck or groin (insertion site). Make sure you: ? Wash your hands with soap and water before you change your bandage (dressing). If soap and water are not available, use hand sanitizer. ? Change your dressing as told by your health care provider.  Check your insertion site every day for signs of infection. Check for: ? More redness, swelling, or pain. ? More fluid or blood. ? Warmth. ? Pus or a bad smell.  Keep the insertion site clean and dry.  Do not shower, bathe, use a hot tub, or let the dressing get wet until your health care provider approves. General instructions  Take over-the-counter and prescription medicines only as told by your health care provider.  Avoid heavy lifting or hard activities for 48 hours after the procedure or as told by your health care provider.  Do not drive for 24 hours if you were given a medicine to help you relax (sedative).  Do not drive or use heavy machinery while taking prescription pain medicine.  Do not go back to school or work until your health care provider approves.  Keep all follow-up visits as told by your health care provider. This is important. Contact a health care provider if:  You have more redness, swelling, or pain around your insertion site.  You have more fluid or blood coming from your insertion site.  Your insertion site feels warm to the touch.  You have pus or a bad smell coming from your insertion site.  You have a fever.  You are dizzy.  You have nausea  and vomiting.  You develop a rash. Get help right away if:  You develop chest pain, a cough, or difficulty breathing.  You develop shortness of breath, feel faint, or pass out.  You cough up blood.  You have severe pain in your abdomen.  You develop swelling and discoloration or pain in your legs.  Your legs become pale and cold or blue.  You develop weakness, difficulty moving your arms or legs, or balance problems.  You develop problems with speech or vision. These symptoms may represent a serious problem that is an emergency. Do not wait to see if the symptoms will go away. Get medical help right away. Call your local emergency services (911 in the [place] Do not drive yourself to the hospital. Summary  After your insertion procedure, it is common to have mild pain and bruising.  Do not shower, bathe, use a hot tub, or  let the dressing get wet until your health care provider approves.  Every day, check for signs of infection where a catheter was inserted at your neck or groin (insertion site). This information is not intended to replace advice given to you by your health care provider. Make sure you discuss any questions you have with your health care provider. Document Released: 11/16/2012 Document Revised: 01/08/2017 Document Reviewed: 12/18/2015 Elsevier Patient Education  2020 [employer].

## 2018-12-19 NOTE — Op Note (Signed)
    Patient name: Brianna Burns MRN: [id number] DOB: 1934/04/13 Sex: female  12/19/2018 Pre-operative Diagnosis: Extensive history of DVT Post-operative diagnosis:  Same Surgeon:  Eda Paschal. Donzetta Matters, MD Procedure Performed: 1.  Ultrasound-guided cannulation right common femoral vein 2.  Infrarenal IVC filter placement ([facility])  Indications: 84 year old female with history of multiple DVTs.  She now is indicated for ORIF of her right patella and will need filter placement prior given that she will need to be off of Eliquis at the time of procedure.  Findings: Infrarenal cava was free of disease less than 20 mm diameter and up was placed just below the level of the renal veins.   Procedure:  The patient was identified in the holding area and taken to room 8.  The patient was then placed supine on the table and prepped and draped in the usual sterile fashion.  A time out was called.  Ultrasound was used to evaluate the right common femoral vein.  This was patent and compressible.  There is anesthetized 1% lidocaine cannulated with 18-gauge needle a Bentson wire was placed under fluoroscopic guidance.  An image was saved the permanent record from ultrasound.  The wire tract was dilated and the introducer sheath was placed.  Venogram was performed renal veins were identified.  The filter was brought to just below the level of the renal veins and deployed.  Sheath was then pulled pressure held for 10 minutes until hemostasis obtained.  She tolerated procedure well immediate complication.  Contrast: 20cc  Jeannie Mallinger C. Donzetta Matters, MD [facility] Office: [phone] Pager: [phone]
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
## 2018-12-20 ENCOUNTER — Other Ambulatory Visit: Payer: Self-pay

## 2018-12-20 ENCOUNTER — Encounter (HOSPITAL_COMMUNITY): Payer: Self-pay | Admitting: Vascular Surgery

## 2018-12-20 NOTE — Progress Notes (Signed)
PCP - Dr. Kathryne Eriksson Cardiologist - none Neurology-Tarsha McCook [facility] 12/17/2018  12/19/2018- IVC filter placed by Dr. Servando Snare  Chest x-ray - none EKG - 12/19/2018 Stress Test - none ECHO - none Cardiac Cath - none  Sleep Study - none CPAP - none  Fasting Blood Sugar - 115 Checks Blood Sugar __1___ times a day  Blood Thinner Instructions:Eliquis 5 mg  Last dose 12/19/2018 8 am Aspirin Instructions:Aspirin 81 mg Last Dose:12/20/2018 at 8 am  Anesthesia review:  Chart given to Jessica,PA to review  Patient has a history of DVT, DM,HTN and pulmonary emboli.  Patient denies shortness of breath, fever, cough and chest pain at PAT appointment   Patient verbalized understanding of instructions that were given to them at the PAT appointment. Patient was also instructed that they will need to review over the PAT instructions again at home before surgery.

## 2018-12-21 ENCOUNTER — Encounter (HOSPITAL_COMMUNITY): Payer: Self-pay | Admitting: *Deleted

## 2018-12-21 ENCOUNTER — Ambulatory Visit (HOSPITAL_COMMUNITY): Payer: Medicare Other | Admitting: Physician Assistant

## 2018-12-21 ENCOUNTER — Ambulatory Visit (HOSPITAL_COMMUNITY): Payer: Medicare Other

## 2018-12-21 ENCOUNTER — Encounter (HOSPITAL_COMMUNITY)
Admission: RE | Disposition: A | Discharge status: Home / Self Care | Payer: Self-pay | Source: Home / Self Care | Attending: Orthopedic Surgery

## 2018-12-21 ENCOUNTER — Observation Stay (HOSPITAL_COMMUNITY)
Admission: RE | Admit: 2018-12-21 | Discharge: 2018-12-22 | Disposition: A | Discharge status: Home / Self Care | Payer: Medicare Other | Attending: Orthopedic Surgery | Admitting: Orthopedic Surgery

## 2018-12-21 DIAGNOSIS — Z8041 Family history of malignant neoplasm of ovary: Secondary | ICD-10-CM | POA: Diagnosis not present

## 2018-12-21 DIAGNOSIS — Z885 Allergy status to narcotic agent status: Secondary | ICD-10-CM | POA: Diagnosis not present

## 2018-12-21 DIAGNOSIS — Z961 Presence of intraocular lens: Secondary | ICD-10-CM | POA: Diagnosis not present

## 2018-12-21 DIAGNOSIS — Z7901 Long term (current) use of anticoagulants: Secondary | ICD-10-CM | POA: Diagnosis not present

## 2018-12-21 DIAGNOSIS — F410 Panic disorder [episodic paroxysmal anxiety] without agoraphobia: Secondary | ICD-10-CM | POA: Insufficient documentation

## 2018-12-21 DIAGNOSIS — Z9841 Cataract extraction status, right eye: Secondary | ICD-10-CM | POA: Diagnosis not present

## 2018-12-21 DIAGNOSIS — K449 Diaphragmatic hernia without obstruction or gangrene: Secondary | ICD-10-CM | POA: Diagnosis not present

## 2018-12-21 DIAGNOSIS — Z86010 Personal history of colonic polyps: Secondary | ICD-10-CM | POA: Insufficient documentation

## 2018-12-21 DIAGNOSIS — Z9842 Cataract extraction status, left eye: Secondary | ICD-10-CM | POA: Diagnosis not present

## 2018-12-21 DIAGNOSIS — Z888 Allergy status to other drugs, medicaments and biological substances status: Secondary | ICD-10-CM | POA: Insufficient documentation

## 2018-12-21 DIAGNOSIS — X58XXXA Exposure to other specified factors, initial encounter: Secondary | ICD-10-CM | POA: Insufficient documentation

## 2018-12-21 DIAGNOSIS — Z7984 Long term (current) use of oral hypoglycemic drugs: Secondary | ICD-10-CM | POA: Insufficient documentation

## 2018-12-21 DIAGNOSIS — Z86711 Personal history of pulmonary embolism: Secondary | ICD-10-CM | POA: Insufficient documentation

## 2018-12-21 DIAGNOSIS — Z90710 Acquired absence of both cervix and uterus: Secondary | ICD-10-CM | POA: Diagnosis not present

## 2018-12-21 DIAGNOSIS — Z7982 Long term (current) use of aspirin: Secondary | ICD-10-CM | POA: Insufficient documentation

## 2018-12-21 DIAGNOSIS — M8580 Other specified disorders of bone density and structure, unspecified site: Secondary | ICD-10-CM | POA: Diagnosis not present

## 2018-12-21 DIAGNOSIS — E785 Hyperlipidemia, unspecified: Secondary | ICD-10-CM | POA: Diagnosis not present

## 2018-12-21 DIAGNOSIS — Z86718 Personal history of other venous thrombosis and embolism: Secondary | ICD-10-CM | POA: Insufficient documentation

## 2018-12-21 DIAGNOSIS — Z419 Encounter for procedure for purposes other than remedying health state, unspecified: Secondary | ICD-10-CM

## 2018-12-21 DIAGNOSIS — F419 Anxiety disorder, unspecified: Secondary | ICD-10-CM | POA: Insufficient documentation

## 2018-12-21 DIAGNOSIS — S82031A Displaced transverse fracture of right patella, initial encounter for closed fracture: Secondary | ICD-10-CM | POA: Diagnosis not present

## 2018-12-21 DIAGNOSIS — I10 Essential (primary) hypertension: Secondary | ICD-10-CM | POA: Insufficient documentation

## 2018-12-21 DIAGNOSIS — M1990 Unspecified osteoarthritis, unspecified site: Secondary | ICD-10-CM | POA: Diagnosis not present

## 2018-12-21 DIAGNOSIS — Z79899 Other long term (current) drug therapy: Secondary | ICD-10-CM | POA: Insufficient documentation

## 2018-12-21 DIAGNOSIS — S82001A Unspecified fracture of right patella, initial encounter for closed fracture: Secondary | ICD-10-CM | POA: Diagnosis present

## 2018-12-21 DIAGNOSIS — Z9049 Acquired absence of other specified parts of digestive tract: Secondary | ICD-10-CM | POA: Insufficient documentation

## 2018-12-21 DIAGNOSIS — E119 Type 2 diabetes mellitus without complications: Secondary | ICD-10-CM | POA: Insufficient documentation

## 2018-12-21 DIAGNOSIS — K219 Gastro-esophageal reflux disease without esophagitis: Secondary | ICD-10-CM | POA: Diagnosis not present

## 2018-12-21 HISTORY — DX: Other specified postprocedural states: R11.2

## 2018-12-21 HISTORY — PX: ORIF PATELLA: SHX5033

## 2018-12-21 HISTORY — DX: Other complications of anesthesia, initial encounter: T88.59XA

## 2018-12-21 HISTORY — DX: Other specified postprocedural states: Z98.890

## 2018-12-21 LAB — GLUCOSE, CAPILLARY
Glucose-Capillary: 110 mg/dL — ABNORMAL HIGH (ref 70–99)
Glucose-Capillary: 125 mg/dL — ABNORMAL HIGH (ref 70–99)
Glucose-Capillary: 275 mg/dL — ABNORMAL HIGH (ref 70–99)

## 2018-12-21 LAB — PROTIME-INR
INR: 1.1 (ref 0.8–1.2)
Prothrombin Time: 14.3 seconds (ref 11.4–15.2)

## 2018-12-21 LAB — BASIC METABOLIC PANEL
Anion gap: 8 (ref 5–15)
BUN: 23 mg/dL (ref 8–23)
CO2: 29 mmol/L (ref 22–32)
Calcium: 9.8 mg/dL (ref 8.9–10.3)
Chloride: 102 mmol/L (ref 98–111)
Creatinine, Ser: 1.07 mg/dL — ABNORMAL HIGH (ref 0.44–1.00)
GFR calc Af Amer: 56 mL/min — ABNORMAL LOW (ref >60)
GFR calc non Af Amer: 48 mL/min — ABNORMAL LOW (ref >60)
Glucose, Bld: 136 mg/dL — ABNORMAL HIGH (ref 70–99)
Potassium: 4.1 mmol/L (ref 3.5–5.1)
Sodium: 139 mmol/L (ref 135–145)

## 2018-12-21 LAB — CBC
HCT: 38.3 % (ref 36.0–46.0)
Hemoglobin: 11.6 g/dL — ABNORMAL LOW (ref 12.0–15.0)
MCH: 30 pg (ref 26.0–34.0)
MCHC: 30.3 g/dL (ref 30.0–36.0)
MCV: 99 fL (ref 80.0–100.0)
Platelets: 243 10*3/uL (ref 150–400)
RBC: 3.87 MIL/uL (ref 3.87–5.11)
RDW: 13.4 % (ref 11.5–15.5)
WBC: 5.7 10*3/uL (ref 4.0–10.5)
nRBC: 0 % (ref 0.0–0.2)

## 2018-12-21 LAB — HEMOGLOBIN A1C
Hgb A1c MFr Bld: 6.7 % — ABNORMAL HIGH (ref 4.8–5.6)
Mean Plasma Glucose: 145.59 mg/dL

## 2018-12-21 SURGERY — OPEN REDUCTION INTERNAL FIXATION (ORIF) PATELLA
Anesthesia: General | Site: Knee | Laterality: Right

## 2018-12-21 MED ORDER — METOCLOPRAMIDE HCL 5 MG PO TABS: 5.0000 mg | ORAL_TABLET | Freq: Three times a day (TID) | ORAL | Status: DC | PRN

## 2018-12-21 MED ORDER — SERTRALINE HCL 25 MG PO TABS
25.0000 mg | ORAL_TABLET | Freq: Every day | ORAL | Status: DC
  Administered 2018-12-22: 25 mg via ORAL
  Filled 2018-12-21: qty 1

## 2018-12-21 MED ORDER — LIDOCAINE 2% (20 MG/ML) 5 ML SYRINGE
INTRAMUSCULAR | Status: DC | PRN
  Administered 2018-12-21: 50 mg via INTRAVENOUS

## 2018-12-21 MED ORDER — INSULIN ASPART 100 UNIT/ML ~~LOC~~ SOLN
0.0000 [IU] | Freq: Every day | SUBCUTANEOUS | Status: DC
  Administered 2018-12-21: 21:00:00 3 [IU] via SUBCUTANEOUS

## 2018-12-21 MED ORDER — SODIUM CHLORIDE 0.9 % IV SOLN: INTRAVENOUS | Status: DC

## 2018-12-21 MED ORDER — PHENOL 1.4 % MT LIQD: 1.0000 | OROMUCOSAL | Status: DC | PRN

## 2018-12-21 MED ORDER — ROCURONIUM BROMIDE 10 MG/ML (PF) SYRINGE
PREFILLED_SYRINGE | INTRAVENOUS | Status: DC | PRN
  Administered 2018-12-21: 50 mg via INTRAVENOUS

## 2018-12-21 MED ORDER — ONDANSETRON HCL 4 MG PO TABS: 4.0000 mg | ORAL_TABLET | Freq: Four times a day (QID) | ORAL | Status: DC | PRN

## 2018-12-21 MED ORDER — SENNA 8.6 MG PO TABS
1.0000 | ORAL_TABLET | Freq: Two times a day (BID) | ORAL | Status: DC
  Administered 2018-12-21 – 2018-12-22 (×2): 8.6 mg via ORAL
  Filled 2018-12-21 (×2): qty 1

## 2018-12-21 MED ORDER — PROPOFOL 10 MG/ML IV BOLUS
INTRAVENOUS | Status: AC
  Filled 2018-12-21: qty 20

## 2018-12-21 MED ORDER — POVIDONE-IODINE 10 % EX SWAB
2.0000 "application " | Freq: Once | CUTANEOUS | Status: AC
  Administered 2018-12-21: 2 via TOPICAL

## 2018-12-21 MED ORDER — IMIPRAMINE HCL 25 MG PO TABS
25.0000 mg | ORAL_TABLET | Freq: Every day | ORAL | Status: DC
  Administered 2018-12-21: 21:00:00 25 mg via ORAL
  Filled 2018-12-21 (×2): qty 1

## 2018-12-21 MED ORDER — CALCIUM-VITAMIN D-VITAMIN K 500-1000-40 MG-UNT-MCG PO CHEW: CHEWABLE_TABLET | Freq: Two times a day (BID) | ORAL | Status: DC

## 2018-12-21 MED ORDER — 0.9 % SODIUM CHLORIDE (POUR BTL) OPTIME
TOPICAL | Status: DC | PRN
  Administered 2018-12-21: 16:00:00 1000 mL

## 2018-12-21 MED ORDER — ALCOHOL, USP 70 % SOLN
Status: DC | PRN
  Administered 2018-12-21: 30 mL

## 2018-12-21 MED ORDER — ROPIVACAINE HCL 5 MG/ML IJ SOLN
INTRAMUSCULAR | Status: DC | PRN
  Administered 2018-12-21: 25 mL via PERINEURAL

## 2018-12-21 MED ORDER — METFORMIN HCL 500 MG PO TABS
1000.0000 mg | ORAL_TABLET | Freq: Every day | ORAL | Status: DC
  Administered 2018-12-22: 08:00:00 1000 mg via ORAL
  Filled 2018-12-21: qty 2

## 2018-12-21 MED ORDER — FENTANYL CITRATE (PF) 250 MCG/5ML IJ SOLN
INTRAMUSCULAR | Status: AC
  Filled 2018-12-21: qty 5

## 2018-12-21 MED ORDER — APIXABAN 2.5 MG PO TABS
2.5000 mg | ORAL_TABLET | Freq: Two times a day (BID) | ORAL | Status: DC
  Administered 2018-12-22: 10:00:00 2.5 mg via ORAL
  Filled 2018-12-21: qty 1

## 2018-12-21 MED ORDER — SUGAMMADEX SODIUM 200 MG/2ML IV SOLN
INTRAVENOUS | Status: DC | PRN
  Administered 2018-12-21: 200 mg via INTRAVENOUS

## 2018-12-21 MED ORDER — FENTANYL CITRATE (PF) 100 MCG/2ML IJ SOLN
25.0000 ug | Freq: Once | INTRAMUSCULAR | Status: AC
  Administered 2018-12-21: 14:00:00 50 ug via INTRAVENOUS
  Filled 2018-12-21: qty 2

## 2018-12-21 MED ORDER — ROSUVASTATIN CALCIUM 10 MG PO TABS
10.0000 mg | ORAL_TABLET | Freq: Every day | ORAL | Status: DC
  Administered 2018-12-22: 10:00:00 10 mg via ORAL
  Filled 2018-12-21: qty 1

## 2018-12-21 MED ORDER — CHLORHEXIDINE GLUCONATE 4 % EX LIQD: 60.0000 mL | Freq: Once | CUTANEOUS | Status: DC

## 2018-12-21 MED ORDER — HYDROMORPHONE HCL 1 MG/ML IJ SOLN: 0.5000 mg | INTRAMUSCULAR | Status: DC | PRN

## 2018-12-21 MED ORDER — ROCURONIUM BROMIDE 10 MG/ML (PF) SYRINGE
PREFILLED_SYRINGE | INTRAVENOUS | Status: AC
  Filled 2018-12-21: qty 10

## 2018-12-21 MED ORDER — ONDANSETRON HCL 4 MG/2ML IJ SOLN: 4.0000 mg | Freq: Four times a day (QID) | INTRAMUSCULAR | Status: DC | PRN

## 2018-12-21 MED ORDER — POLYVINYL ALCOHOL 1.4 % OP SOLN: 1.0000 [drp] | OPHTHALMIC | Status: DC | PRN

## 2018-12-21 MED ORDER — LACTATED RINGERS IV SOLN
INTRAVENOUS | Status: DC
  Administered 2018-12-21 (×2): via INTRAVENOUS

## 2018-12-21 MED ORDER — CALCIUM CARBONATE-VITAMIN D 500-200 MG-UNIT PO TABS
1.0000 | ORAL_TABLET | Freq: Two times a day (BID) | ORAL | Status: DC
  Administered 2018-12-22: 08:00:00 1 via ORAL
  Filled 2018-12-21: qty 1

## 2018-12-21 MED ORDER — LIDOCAINE 2% (20 MG/ML) 5 ML SYRINGE
INTRAMUSCULAR | Status: AC
  Filled 2018-12-21: qty 5

## 2018-12-21 MED ORDER — CEFAZOLIN SODIUM-DEXTROSE 2-4 GM/100ML-% IV SOLN
2.0000 g | INTRAVENOUS | Status: AC
  Administered 2018-12-21: 2 g via INTRAVENOUS
  Filled 2018-12-21: qty 100

## 2018-12-21 MED ORDER — HYDROMORPHONE HCL 2 MG PO TABS: 1.0000 mg | ORAL_TABLET | ORAL | Status: DC | PRN

## 2018-12-21 MED ORDER — PANTOPRAZOLE SODIUM 40 MG PO TBEC
80.0000 mg | DELAYED_RELEASE_TABLET | Freq: Every day | ORAL | Status: DC
  Administered 2018-12-22: 10:00:00 80 mg via ORAL
  Filled 2018-12-21: qty 2

## 2018-12-21 MED ORDER — CARBOXYMETHYLCELLULOSE SOD PF 0.5 % OP SOLN: 1.0000 [drp] | Freq: Three times a day (TID) | OPHTHALMIC | Status: DC | PRN

## 2018-12-21 MED ORDER — ACETAMINOPHEN 325 MG PO TABS: 325.0000 mg | ORAL_TABLET | Freq: Four times a day (QID) | ORAL | Status: DC | PRN

## 2018-12-21 MED ORDER — ONDANSETRON HCL 4 MG/2ML IJ SOLN
INTRAMUSCULAR | Status: AC
  Filled 2018-12-21: qty 2

## 2018-12-21 MED ORDER — DEXAMETHASONE SODIUM PHOSPHATE 10 MG/ML IJ SOLN
INTRAMUSCULAR | Status: DC | PRN
  Administered 2018-12-21: 5 mg via INTRAVENOUS

## 2018-12-21 MED ORDER — LOSARTAN POTASSIUM 25 MG PO TABS
25.0000 mg | ORAL_TABLET | Freq: Every day | ORAL | Status: DC
  Administered 2018-12-22: 10:00:00 25 mg via ORAL
  Filled 2018-12-21: qty 1

## 2018-12-21 MED ORDER — PROPOFOL 10 MG/ML IV BOLUS
INTRAVENOUS | Status: DC | PRN
  Administered 2018-12-21: 100 mg via INTRAVENOUS

## 2018-12-21 MED ORDER — FENTANYL CITRATE (PF) 250 MCG/5ML IJ SOLN
INTRAMUSCULAR | Status: DC | PRN
  Administered 2018-12-21: 100 ug via INTRAVENOUS
  Administered 2018-12-21: 50 ug via INTRAVENOUS

## 2018-12-21 MED ORDER — INSULIN ASPART 100 UNIT/ML ~~LOC~~ SOLN
0.0000 [IU] | Freq: Three times a day (TID) | SUBCUTANEOUS | Status: DC
  Administered 2018-12-22: 08:00:00 1 [IU] via SUBCUTANEOUS

## 2018-12-21 MED ORDER — PHENYLEPHRINE 40 MCG/ML (10ML) SYRINGE FOR IV PUSH (FOR BLOOD PRESSURE SUPPORT)
PREFILLED_SYRINGE | INTRAVENOUS | Status: DC | PRN
  Administered 2018-12-21 (×2): 80 ug via INTRAVENOUS

## 2018-12-21 MED ORDER — DOCUSATE SODIUM 100 MG PO CAPS
100.0000 mg | ORAL_CAPSULE | Freq: Two times a day (BID) | ORAL | Status: DC
  Administered 2018-12-21 – 2018-12-22 (×2): 100 mg via ORAL
  Filled 2018-12-21 (×2): qty 1

## 2018-12-21 MED ORDER — DEXAMETHASONE SODIUM PHOSPHATE 10 MG/ML IJ SOLN
INTRAMUSCULAR | Status: AC
  Filled 2018-12-21: qty 1

## 2018-12-21 MED ORDER — MIDAZOLAM HCL 2 MG/2ML IJ SOLN
0.5000 mg | Freq: Once | INTRAMUSCULAR | Status: DC
  Filled 2018-12-21: qty 2

## 2018-12-21 MED ORDER — FENTANYL CITRATE (PF) 100 MCG/2ML IJ SOLN: 25.0000 ug | INTRAMUSCULAR | Status: DC | PRN

## 2018-12-21 MED ORDER — MENTHOL 3 MG MT LOZG: 1.0000 | LOZENGE | OROMUCOSAL | Status: DC | PRN

## 2018-12-21 MED ORDER — PHENYLEPHRINE 40 MCG/ML (10ML) SYRINGE FOR IV PUSH (FOR BLOOD PRESSURE SUPPORT)
PREFILLED_SYRINGE | INTRAVENOUS | Status: AC
  Filled 2018-12-21: qty 10

## 2018-12-21 MED ORDER — METOCLOPRAMIDE HCL 5 MG/ML IJ SOLN: 5.0000 mg | Freq: Three times a day (TID) | INTRAMUSCULAR | Status: DC | PRN

## 2018-12-21 MED ORDER — STERILE WATER FOR IRRIGATION IR SOLN
Status: DC | PRN
  Administered 2018-12-21: 1000 mL

## 2018-12-21 MED ORDER — ONDANSETRON HCL 4 MG/2ML IJ SOLN
INTRAMUSCULAR | Status: DC | PRN
  Administered 2018-12-21: 4 mg via INTRAVENOUS

## 2018-12-21 MED ORDER — SODIUM CHLORIDE 0.9 % IV SOLN
INTRAVENOUS | Status: DC
  Administered 2018-12-21 – 2018-12-22 (×2): via INTRAVENOUS

## 2018-12-21 SURGICAL SUPPLY — 58 items
BAG ZIPLOCK 12X15 (MISCELLANEOUS) ×3 IMPLANT
BIT DRILL 2.9 CANN QC NONSTRL (BIT) ×3 IMPLANT
BNDG ELASTIC 4X5.8 VLCR STR LF (GAUZE/BANDAGES/DRESSINGS) ×3 IMPLANT
BNDG ELASTIC 6X5.8 VLCR STR LF (GAUZE/BANDAGES/DRESSINGS) ×3 IMPLANT
BNDG GAUZE ELAST 4 BULKY (GAUZE/BANDAGES/DRESSINGS) ×3 IMPLANT
BroadBand Tape ×3 IMPLANT
CHLORAPREP W/TINT 26 (MISCELLANEOUS) ×3 IMPLANT
COVER SURGICAL LIGHT HANDLE (MISCELLANEOUS) ×3 IMPLANT
COVER WAND RF STERILE (DRAPES) IMPLANT
CUFF TOURN SGL QUICK 34 (TOURNIQUET CUFF) ×2
CUFF TOURN SGL QUICK 42 (TOURNIQUET CUFF) IMPLANT
CUFF TRNQT CYL 34X4.125X (TOURNIQUET CUFF) ×1 IMPLANT
DECANTER SPIKE VIAL GLASS SM (MISCELLANEOUS) IMPLANT
DERMABOND ADVANCED (GAUZE/BANDAGES/DRESSINGS) ×4
DERMABOND ADVANCED .7 DNX12 (GAUZE/BANDAGES/DRESSINGS) ×2 IMPLANT
DRAPE C-ARM 42X120 X-RAY (DRAPES) ×3 IMPLANT
DRAPE C-ARMOR (DRAPES) ×3 IMPLANT
DRAPE IMP U-DRAPE 54X76 (DRAPES) ×3 IMPLANT
DRAPE SHEET LG 3/4 BI-LAMINATE (DRAPES) ×9 IMPLANT
DRAPE U-SHAPE 47X51 STRL (DRAPES) ×3 IMPLANT
DRESSING AQUACEL AG SP 3.5X10 (GAUZE/BANDAGES/DRESSINGS) ×1 IMPLANT
DRSG AQUACEL AG ADV 3.5X 6 (GAUZE/BANDAGES/DRESSINGS) ×3 IMPLANT
DRSG AQUACEL AG SP 3.5X10 (GAUZE/BANDAGES/DRESSINGS) ×3
ELECT REM PT RETURN 15FT ADLT (MISCELLANEOUS) ×3 IMPLANT
GAUZE SPONGE 4X4 12PLY STRL (GAUZE/BANDAGES/DRESSINGS) ×3 IMPLANT
GLOVE BIO SURGEON STRL SZ8.5 (GLOVE) ×6 IMPLANT
GLOVE BIOGEL PI IND STRL 8.5 (GLOVE) ×1 IMPLANT
GLOVE BIOGEL PI INDICATOR 8.5 (GLOVE) ×2
GOWN SPEC L3 XXLG W/TWL (GOWN DISPOSABLE) ×3 IMPLANT
GOWN STRL REUS W/TWL LRG LVL3 (GOWN DISPOSABLE) ×3 IMPLANT
IMMOBILIZER KNEE 20 (SOFTGOODS) ×3
IMMOBILIZER KNEE 20 THIGH 36 (SOFTGOODS) ×1 IMPLANT
K-WIRE ACE 1.6X6 (WIRE) ×6
KIT TURNOVER KIT A (KITS) ×3 IMPLANT
KWIRE ACE 1.6X6 (WIRE) ×2 IMPLANT
MANIFOLD NEPTUNE II (INSTRUMENTS) ×3 IMPLANT
MARKER SKIN DUAL TIP RULER LAB (MISCELLANEOUS) ×3 IMPLANT
NS IRRIG 1000ML POUR BTL (IV SOLUTION) ×3 IMPLANT
PACK TOTAL KNEE CUSTOM (KITS) ×3 IMPLANT
PADDING CAST COTTON 6X4 STRL (CAST SUPPLIES) ×6 IMPLANT
PROTECTOR NERVE ULNAR (MISCELLANEOUS) ×3 IMPLANT
RETRIEVER SUT HEWSON (MISCELLANEOUS) ×3 IMPLANT
SCREW ACE CAN 4.0 34M (Screw) ×6 IMPLANT
SCREW ACE CAN 4.0 38M (Screw) ×3 IMPLANT
STAPLER VISISTAT 35W (STAPLE) ×3 IMPLANT
SUT BROADBAND TAPE 2PK 1.5 (SUTURE) ×3 IMPLANT
SUT FIBERWIRE #2 38 T-5 BLUE (SUTURE)
SUT MON AB 2-0 CT1 36 (SUTURE) ×3 IMPLANT
SUT VIC AB 0 CT1 27 (SUTURE)
SUT VIC AB 0 CT1 27XBRD ANTBC (SUTURE) IMPLANT
SUT VIC AB 1 CT1 27 (SUTURE)
SUT VIC AB 1 CT1 27XBRD ANTBC (SUTURE) IMPLANT
SUT VIC AB 1 CT1 36 (SUTURE) ×3 IMPLANT
SUT VIC AB 2-0 CT1 27 (SUTURE)
SUT VIC AB 2-0 CT1 TAPERPNT 27 (SUTURE) IMPLANT
SUTURE FIBERWR #2 38 T-5 BLUE (SUTURE) IMPLANT
WATER STERILE IRR 1000ML POUR (IV SOLUTION) ×3 IMPLANT
WRAP KNEE MAXI GEL POST OP (GAUZE/BANDAGES/DRESSINGS) ×3 IMPLANT

## 2018-12-21 NOTE — Transfer of Care (Signed)
Immediate Anesthesia Transfer of Care Note  Patient: Brianna Burns  Procedure(s) Performed: OPEN REDUCTION INTERNAL (ORIF) FIXATION RIGHT PATELLA (Right Knee)  Patient Location: PACU  Anesthesia Type:General  Level of Consciousness: sedated  Airway & Oxygen Therapy: Patient Spontanous Breathing and Patient connected to face mask oxygen  Post-op Assessment: Report given to RN and Post -op Vital signs reviewed and stable  Post vital signs: Reviewed and stable  Last Vitals:  Vitals Value Taken Time  BP    Temp    Pulse 86 12/21/18 1633  Resp    SpO2 100 % 12/21/18 1633  Vitals shown include unvalidated device data.  Last Pain:  Vitals:   12/21/18 1224  TempSrc: Oral         Complications: No apparent anesthesia complications
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
## 2018-12-21 NOTE — Anesthesia Preprocedure Evaluation (Addendum)
Anesthesia Evaluation  Patient identified by MRN, date of birth, ID band Patient awake    Reviewed: Allergy & Precautions, H&P , NPO status , Patient's Chart, lab work & pertinent test results  History of Anesthesia Complications (+) PONV and history of anesthetic complications  Airway Mallampati: II   Neck ROM: full    Dental   Pulmonary PE   breath sounds clear to auscultation       Cardiovascular hypertension, + DVT   Rhythm:regular Rate:Normal     Neuro/Psych PSYCHIATRIC DISORDERS Anxiety negative neurological ROS     GI/Hepatic Neg liver ROS, hiatal hernia, GERD  ,  Endo/Other  diabetes, Oral Hypoglycemic Agents  Renal/GU negative Renal ROS  negative genitourinary   Musculoskeletal  (+) Arthritis ,   Abdominal   Peds  Hematology negative hematology ROS (+)   Anesthesia Other Findings IVC filter placed 2 days ago in prep for surgery- Eliquis on hold  Reproductive/Obstetrics                           Anesthesia Physical Anesthesia Plan  ASA: III  Anesthesia Plan: General   Post-op Pain Management: GA combined w/ Regional for post-op pain   Induction: Intravenous  PONV Risk Score and Plan: 4 or greater and Ondansetron, Dexamethasone and Treatment may vary due to age or medical condition  Airway Management Planned: Oral ETT  Additional Equipment: None  Intra-op Plan:   Post-operative Plan: Extubation in OR  Informed Consent: I have reviewed the patients History and Physical, chart, labs and discussed the procedure including the risks, benefits and alternatives for the proposed anesthesia with the patient or authorized representative who has indicated his/her understanding and acceptance.       Plan Discussed with: CRNA, Anesthesiologist and Surgeon  Anesthesia Plan Comments:        Anesthesia Quick Evaluation

## 2018-12-21 NOTE — Anesthesia Procedure Notes (Addendum)
Procedure Name: Intubation Date/Time: 12/21/2018 3:04 PM Performed by: Talbot Grumbling, CRNA Pre-anesthesia Checklist: Patient identified, Emergency Drugs available, Suction available and Patient being monitored Patient Re-evaluated:Patient Re-evaluated prior to induction Oxygen Delivery Method: Circle system utilized Preoxygenation: Pre-oxygenation with 100% oxygen Induction Type: IV induction Ventilation: Mask ventilation without difficulty Laryngoscope Size: Mac and 3 Grade View: Grade I Tube type: Oral Tube size: 7.0 mm Number of attempts: 1 Airway Equipment and Method: Stylet Placement Confirmation: ETT inserted through vocal cords under direct vision,  positive ETCO2 and breath sounds checked- equal and bilateral Secured at: 21 cm Tube secured with: Tape Dental Injury: Teeth and Oropharynx as per pre-operative assessment

## 2018-12-21 NOTE — Op Note (Signed)
OPERATIVE REPORT   12/21/2018  4:37 PM  PATIENT:  Brianna Burns   SURGEON:  Bertram Savin, MD  ASSISTANT:  Staff.   PREOPERATIVE DIAGNOSIS:  Displaced right patella fracture.  POSTOPERATIVE DIAGNOSIS:  Same.  PROCEDURE:  Open reduction internal fixation right patella.  ANESTHESIA:   GETA. Adductor canal block.  ANTIBIOTICS:  2 g Ancef.  IMPLANTS:  Biomet 4.0 mm cannulated screws.  SPECIMENS:  None.  COMPLICATIONS:  None.  DISPOSITION:  Stable to PACU.  SURGICAL INDICATIONS:  Brianna Burns is a 84 y.o. female with a diagnosis of displaced right transverse patella fracture.  She was unable to perform a straight leg raise.  She was indicated for ORIF of the patella.  Preoperative temporary IVC filter was placed due to extensive history of DVT/PE.  The risks, benefits, and alternatives were discussed with the patient preoperatively including but not limited to the risks of infection, bleeding, nerve / blood vessel injury, malunion, nonunion, cardiopulmonary complications, the need for repeat surgery, among others, and the patient was willing to proceed.  PROCEDURE IN DETAIL: The patient was identified in the holding area using 2 identifiers.  The surgery site was marked by myself.  Anesthesia team placed an adductor canal block.  She was taken to the operating room, placed supine on the operating room table.  General anesthesia was induced.  A tourniquet was applied to the right thigh but not utilized.  She was positioned on a bone foam positioner.  All bony prominences were well-padded.  The right lower extremity was prepped and draped in the normal sterile surgical fashion.  Timeout was called verifying site and site of surgery.  Longitudinal anterior incision was created centered over the patella.  Full-thickness skin flaps were created.  The retinaculum was surprisingly not torn.  I reduced the fracture with a point-to-point clamp.  Reduction was confirmed on AP and  lateral fluoroscopy views.  Using live fluoroscopic control, I placed 2 retrograde guidepins for cannulated screws.  Position was checked on fluoroscopy.  I then sequentially drilled the near cortex, measured, and placed the 4.0 mm cannulated screw.  The fracture compressed well.  Through the cannulated screws, I placed a 2 mm Maxbraid Broadband tape in figure-of-eight fashion.  Final AP and lateral fluoroscopy views were used to confirm hardware placement and fracture reduction.    The wound was copiously irrigated with saline.  The wound was closed in layers with 2-0 Monocryl for the deep dermal layer and staples plus Dermabond for the skin.  Once the glue was dried, Aquacel dressing was applied.  Sterile bulky dressing was applied with cast padding and an Ace wrap.  Knee immobilizer placed.  Patient was then extubated and taken to the PACU in stable condition.  Sponge, needle, and instrument counts were correct at the end of the case x2.  There were no known complications.  POSTOPERATIVE PLAN: Postoperatively, the patient be admitted for overnight observation.  Knee immobilizer at all times.  Do not bend the knee.  Weightbearing as tolerated right lower extremity and knee immobilizer.  Begin Eliquis tomorrow at 2.5 mg p.o. twice daily.  Mobilize out of bed with physical therapy.  Plan for discharge tomorrow.  Return for routine 2-week postoperative care.

## 2018-12-21 NOTE — Anesthesia Postprocedure Evaluation (Signed)
Anesthesia Post Note  Patient: Brianna Burns  Procedure(s) Performed: OPEN REDUCTION INTERNAL (ORIF) FIXATION RIGHT PATELLA (Right Knee)     Patient location during evaluation: PACU Anesthesia Type: General Level of consciousness: awake and alert Pain management: pain level controlled Vital Signs Assessment: post-procedure vital signs reviewed and stable Respiratory status: spontaneous breathing, nonlabored ventilation and respiratory function stable Cardiovascular status: blood pressure returned to baseline and stable Postop Assessment: no apparent nausea or vomiting Anesthetic complications: no    Last Vitals:  Vitals:   12/21/18 1730 12/21/18 1756  BP: 140/73 (!) 145/71  Pulse: 75 85  Resp: (!) 21 15  Temp:  37.1 C  SpO2: 100% 93%    Last Pain:  Vitals:   12/21/18 1756  TempSrc: Axillary  PainSc: 0-No pain                 Audry Pili

## 2018-12-21 NOTE — Anesthesia Procedure Notes (Signed)
Anesthesia Regional Block: Adductor canal block   Pre-Anesthetic Checklist: ,, timeout performed, Correct Patient, Correct Site, Correct Laterality, Correct Procedure, Correct Position, site marked, Risks and benefits discussed,  Surgical consent,  Pre-op evaluation,  At surgeon's request and post-op pain management  Laterality: Right  Prep: chloraprep       Needles:  Injection technique: Single-shot  Needle Type: Echogenic Needle     Needle Length: 9cm  Needle Gauge: 21     Additional Needles:   Narrative:  Start time: 12/21/2018 2:16 PM End time: 12/21/2018 2:21 PM Injection made incrementally with aspirations every 5 mL.  Performed by: Personally  Anesthesiologist: Albertha Ghee, MD  Additional Notes: Pt tolerated the procedure well.

## 2018-12-21 NOTE — Progress Notes (Signed)
Assisted Dr. Hodierne with right, ultrasound guided, adductor canal block. Side rails up, monitors on throughout procedure. See vital signs in flow sheet. Tolerated Procedure well.  

## 2018-12-22 ENCOUNTER — Encounter (HOSPITAL_COMMUNITY): Payer: Self-pay | Admitting: Orthopedic Surgery

## 2018-12-22 DIAGNOSIS — S82031A Displaced transverse fracture of right patella, initial encounter for closed fracture: Secondary | ICD-10-CM | POA: Diagnosis not present

## 2018-12-22 LAB — CBC
HCT: 35.1 % — ABNORMAL LOW (ref 36.0–46.0)
Hemoglobin: 10.7 g/dL — ABNORMAL LOW (ref 12.0–15.0)
MCH: 29.6 pg (ref 26.0–34.0)
MCHC: 30.5 g/dL (ref 30.0–36.0)
MCV: 97.2 fL (ref 80.0–100.0)
Platelets: 231 10*3/uL (ref 150–400)
RBC: 3.61 MIL/uL — ABNORMAL LOW (ref 3.87–5.11)
RDW: 12.9 % (ref 11.5–15.5)
WBC: 7.7 10*3/uL (ref 4.0–10.5)
nRBC: 0 % (ref 0.0–0.2)

## 2018-12-22 LAB — BASIC METABOLIC PANEL
Anion gap: 11 (ref 5–15)
BUN: 19 mg/dL (ref 8–23)
CO2: 23 mmol/L (ref 22–32)
Calcium: 8.7 mg/dL — ABNORMAL LOW (ref 8.9–10.3)
Chloride: 100 mmol/L (ref 98–111)
Creatinine, Ser: 0.85 mg/dL (ref 0.44–1.00)
GFR calc Af Amer: >60 mL/min (ref >60)
GFR calc non Af Amer: >60 mL/min (ref >60)
Glucose, Bld: 170 mg/dL — ABNORMAL HIGH (ref 70–99)
Potassium: 4.1 mmol/L (ref 3.5–5.1)
Sodium: 134 mmol/L — ABNORMAL LOW (ref 135–145)

## 2018-12-22 LAB — GLUCOSE, CAPILLARY
Glucose-Capillary: 125 mg/dL — ABNORMAL HIGH (ref 70–99)
Glucose-Capillary: 114 mg/dL — ABNORMAL HIGH (ref 70–99)

## 2018-12-22 MED ORDER — TRAMADOL HCL 50 MG PO TABS: 50.0000 mg | ORAL_TABLET | Freq: Four times a day (QID) | ORAL | 0 refills | Status: DC | PRN

## 2018-12-22 MED ORDER — DOCUSATE SODIUM 100 MG PO CAPS: 100.0000 mg | ORAL_CAPSULE | Freq: Two times a day (BID) | ORAL | 0 refills | Status: AC

## 2018-12-22 MED ORDER — ONDANSETRON HCL 4 MG PO TABS: 4.0000 mg | ORAL_TABLET | Freq: Four times a day (QID) | ORAL | 0 refills | Status: DC | PRN

## 2018-12-22 MED ORDER — TRAMADOL HCL 50 MG PO TABS
50.0000 mg | ORAL_TABLET | Freq: Four times a day (QID) | ORAL | Status: DC | PRN
  Administered 2018-12-22 (×2): 50 mg via ORAL
  Filled 2018-12-22 (×2): qty 1

## 2018-12-22 MED ORDER — SENNA 8.6 MG PO TABS: 2.0000 | ORAL_TABLET | Freq: Every day | ORAL | 1 refills | Status: AC

## 2018-12-22 MED FILL — Isopropyl Alcohol 70%: Qty: 30 | Status: AC

## 2018-12-22 NOTE — Plan of Care (Signed)
  Problem: Clinical Measurements: Goal: Ability to maintain clinical measurements within normal limits will improve Outcome: Adequate for Discharge Goal: Will remain free from infection Outcome: Adequate for Discharge Goal: Diagnostic test results will improve Outcome: Adequate for Discharge Goal: Respiratory complications will improve Outcome: Adequate for Discharge Goal: Cardiovascular complication will be avoided Outcome: Adequate for Discharge   Problem: Activity: Goal: Risk for activity intolerance will decrease Outcome: Adequate for Discharge   Problem: Nutrition: Goal: Adequate nutrition will be maintained Outcome: Adequate for Discharge   Problem: Elimination: Goal: Will not experience complications related to bowel motility Outcome: Adequate for Discharge Goal: Will not experience complications related to urinary retention Outcome: Adequate for Discharge   Problem: Pain Managment: Goal: General experience of comfort will improve Outcome: Adequate for Discharge   Problem: Safety: Goal: Ability to remain free from injury will improve Outcome: Adequate for Discharge   Problem: Skin Integrity: Goal: Risk for impaired skin integrity will decrease Outcome: Adequate for Discharge

## 2018-12-22 NOTE — Progress Notes (Signed)
Pt c/o 5/10 pain to rt knee and is requesting to take tramadol which she takes at home. Notified on call provider for [facility]. Received a call back from [facility]. New orders for tramadol 50mg  Q6hrs PRN for pain level 4-6.

## 2018-12-22 NOTE — Discharge Summary (Signed)
Physician Discharge Summary  Patient ID: Brianna Burns MRN: [id number] DOB/AGE: 1934-11-29 84 y.o.  Admit date: 12/21/2018 Discharge date: 12/22/2018  Admission Diagnoses:  Displaced fracture of right patella  Discharge Diagnoses:  Principal Problem:   Displaced fracture of right patella   Past Medical History:  Diagnosis Date  . Arthritis   . Blood transfusion    after hysterectomy  . Blood transfusion without reported diagnosis   . Colon polyps   . Complication of anesthesia   . Concussion    fell in street   . Deep vein thrombosis ([place])    right  leg 01/2011  . Diabetes mellitus without complication (HCC)    metformin 2 x a day   . GERD (gastroesophageal reflux disease)   . Hiatal hernia 02-2014   [facility]  . Hyperlipidemia    bad cholesterol elevated on pravastatin  . Hypertension   . Panic attack    in [facility] 2 weeks for this due to knee surgery   . PONV (postoperative nausea and vomiting)    severe  . Pulmonary emboli ([place]) 12/26/2017   from broken ribs-had clot in lungs-was placed on Eliquis    Surgeries: Procedure(s): OPEN REDUCTION INTERNAL (ORIF) FIXATION RIGHT PATELLA on 12/21/2018   Consultants (if any):   Discharged Condition: Improved  Hospital Course: Brianna Burns is an 84 y.o. female who was admitted 12/21/2018 with a diagnosis of Displaced fracture of right patella and went to the operating room on 12/21/2018 and underwent the above named procedures.    She was given perioperative antibiotics:  Anti-infectives (From admission, onward)   Start     Dose/Rate Route Frequency Ordered Stop   12/21/18 1215  ceFAZolin (ANCEF) IVPB 2g/100 mL premix     2 g 200 mL/hr over 30 Minutes Intravenous On call to O.R. 12/21/18 1207 12/21/18 1510    .  Knee immobilizer in place.  She was given sequential compression devices, early ambulation, and apixaban for DVT prophylaxis.  She benefited maximally from the hospital stay and there were no  complications.    Recent vital signs:  Vitals:   12/22/18 1011 12/22/18 1051  BP: (!) 144/63   Pulse: 90   Resp: 17   Temp: 97.7 F (36.5 C)   SpO2: 100% 99%    Recent laboratory studies:  Lab Results  Component Value Date   HGB 10.7 (L) 12/22/2018   HGB 11.6 (L) 12/21/2018   HGB 12.2 12/19/2018   Lab Results  Component Value Date   WBC 7.7 12/22/2018   PLT 231 12/22/2018   Lab Results  Component Value Date   INR 1.1 12/21/2018   Lab Results  Component Value Date   NA 134 (L) 12/22/2018   K 4.1 12/22/2018   CL 100 12/22/2018   CO2 23 12/22/2018   BUN 19 12/22/2018   CREATININE 0.85 12/22/2018   GLUCOSE 170 (H) 12/22/2018    Discharge Medications:   Allergies as of 12/22/2018      Reactions   Ativan [lorazepam] Other (See Comments)   Pt unaware of where she was    Baclofen    Pt unaware of where she was    Hydrocodone-acetaminophen Diarrhea   Tylox [oxycodone-acetaminophen] Nausea And Vomiting      Medication List    STOP taking these medications   acetaminophen 650 MG CR tablet Commonly known as: TYLENOL   aspirin 81 MG tablet     TAKE these medications   Accu-Chek Aviva Soln  Accu-Chek FastClix Lancet Kit   Accu-Chek FastClix Lancets Misc   CALCIUM + D PO Take 1 tablet by mouth 2 (two) times daily. Viactiv   docusate sodium 100 MG capsule Commonly known as: COLACE Take 1 capsule (100 mg total) by mouth 2 (two) times daily.   Eliquis 5 MG Tabs tablet Generic drug: apixaban Take 5 mg by mouth 2 (two) times daily.   glucose blood test strip Commonly known as: Accu-Chek Aviva Plus Dx 250.00  Check sugar 1-2 times daily   imipramine 25 MG tablet Commonly known as: TOFRANIL Take 25 mg by mouth at bedtime.   losartan 25 MG tablet Commonly known as: COZAAR Take 1 tablet (25 mg total) by mouth daily.   metFORMIN 1000 MG tablet Commonly known as: GLUCOPHAGE Take 1,000 mg by mouth daily with breakfast.   omeprazole 40 MG  capsule Commonly known as: PRILOSEC Take 1 capsule (40 mg total) by mouth daily.   ondansetron 4 MG tablet Commonly known as: ZOFRAN Take 1 tablet (4 mg total) by mouth every 6 (six) hours as needed for nausea.   Refresh Plus 0.5 % Soln Generic drug: Carboxymethylcellulose Sod PF Place 1 drop into the left eye 3 (three) times daily as needed (dry eye).   rosuvastatin 10 MG tablet Commonly known as: CRESTOR Take 10 mg by mouth daily.   senna 8.6 MG Tabs tablet Commonly known as: SENOKOT Take 2 tablets (17.2 mg total) by mouth at bedtime.   sertraline 25 MG tablet Commonly known as: ZOLOFT Take 25 mg by mouth daily.   traMADol 50 MG tablet Commonly known as: ULTRAM Take 1 tablet (50 mg total) by mouth every 6 (six) hours as needed for moderate pain (pain level 4-6). What changed:   when to take this  reasons to take this   VITAMIN D PO Take 5,000 Units by mouth daily.       Diagnostic Studies: Dg Knee 4 Views W/patella Right  Result Date: 12/21/2018 CLINICAL DATA:  Patella ORIF. EXAM: RIGHT KNEE - COMPLETE 4+ VIEW COMPARISON:  None. FLUOROSCOPY TIME:  45 seconds. C-arm fluoroscopic images were obtained intraoperatively and submitted for post operative interpretation. FINDINGS: Intraoperative fluoroscopic images demonstrate interval ORIF of a transverse patellar fracture with two screws. Alignment is anatomic. IMPRESSION: Intraoperative fluoroscopic guidance for patella ORIF. Electronically Signed   By: Titus Dubin M.D.   On: 12/21/2018 16:30   Dg Knee Right [place]  Result Date: 12/21/2018 CLINICAL DATA:  84 year old female status post ORIF of the patella. EXAM: PORTABLE RIGHT KNEE - 1-2 VIEW COMPARISON:  Intraoperative fluoroscopy image dated 12/21/2018. FINDINGS: Two fixation screws noted through the patellar fracture. The fracture fragments appear in anatomic alignment. The screws are intact. No other fracture identified. The bones are osteopenic. Mild to  moderate arthritic changes of the knee with mild joint space narrowing and meniscal chondrocalcinosis. There is a small suprapatellar effusion. Cutaneous staples noted over the knee. IMPRESSION: Status post ORIF of the patellar fracture. The fracture fragments appear in anatomic alignment. No other fracture. Electronically Signed   By: Anner Crete M.D.   On: 12/21/2018 16:56   Dg C-arm 1-60 Min-no Report  Result Date: 12/21/2018 Fluoroscopy was utilized by the requesting physician.  No radiographic interpretation.    Disposition: Discharge disposition: 01-Home or Self Care       Discharge Instructions    Call MD / Call 911   Complete by: As directed    If you experience chest pain or shortness  of breath, CALL 911 and be transported to the hospital emergency room.  If you develope a fever above 101 F, pus (white drainage) or increased drainage or redness at the wound, or calf pain, call your surgeon's office.   Constipation Prevention   Complete by: As directed    Drink plenty of fluids.  Prune juice may be helpful.  You may use a stool softener, such as Colace (over the counter) 100 mg twice a day.  Use MiraLax (over the counter) for constipation as needed.   Diet - low sodium heart healthy   Complete by: As directed    Discharge instructions   Complete by: As directed    Wear knee immobilizer at all times. You may remove it once daily for a skin check. Do NOT bend your knee. Weight bearing as tolerated in the knee immobilizer. You may adjust / remove ace wrap as necessary.   Driving restrictions   Complete by: As directed    No driving   Increase activity slowly as tolerated   Complete by: As directed    Lifting restrictions   Complete by: As directed    No lifting for 6 weeks      Follow-up Information    Nubia Ziesmer, Aaron Edelman, MD. Schedule an appointment as soon as possible for a visit in 2 weeks.   Specialty: Orthopedic Surgery Why: For suture removal Contact  information: [address] [place] [address] [phone]            Signed: Hilton Cork Ethelda Deangelo 12/22/2018, 11:05 AM

## 2018-12-22 NOTE — Discharge Instructions (Signed)
Wear knee immobilizer at all times. You may remove it once daily for a skin check. Do NOT bend your knee. Weight bearing as tolerated in the knee immobilizer. You may adjust / remove ace wrap as necessary.

## 2018-12-22 NOTE — TOC Transition Note (Signed)
Transition of Care [facility]) - CM/SW Discharge Note   Patient Details  Name: Brianna Burns MRN: [id number] Date of Birth: 12-20-1934  [facility]) CM/SW Contact:  Lia Hopping, [place] Phone Number: 12/22/2018, 12:54 PM   Clinical Narrative:    CSW met with the patient and her daughter at bedside to discuss [place] PT options. Patient reports her spouse has used [facility] recently and prefers to use their services as well.  CSW confirm with Rep. Staff available.  Patient reports she has "plenty of DME"-RW , 3 in 1 and [place].    Final next level of care: [place] Barriers to Discharge: No Barriers Identified   Patient Goals and CMS Choice Patient states their goals for this hospitalization and ongoing recovery are:: "I want to do therapy at home." CMS Medicare.gov Compare Post Acute Care list provided to:: Patient Choice offered to / list presented to : Patient  Discharge Placement                       Discharge Plan and Services                DME Arranged: (Patient has DME)         HH Arranged: PT, OT   Date HH Agency Contacted: 12/22/18 Time [place]: [phone] Representative spoke with at [place]: Mariano Colon ([place]) Interventions     Readmission Risk Interventions No flowsheet data found.

## 2018-12-22 NOTE — Progress Notes (Signed)
    Subjective:  Patient reports pain as mild to moderate.  Denies N/V/CP/SOB. No c/o.  Objective:   VITALS:   Vitals:   12/22/18 0502 12/22/18 1002 12/22/18 1011 12/22/18 1051  BP: 131/70  (!) 144/63   Pulse: 76  90   Resp: 17  17   Temp: 97.9 F (36.6 C)  97.7 F (36.5 C)   TempSrc: Oral  Oral   SpO2: 100% 95% 100% 99%  Weight:      Height:        NAD ABD soft Sensation intact distally Intact pulses distally Dorsiflexion/Plantar flexion intact Incision: dressing C/D/I Compartment soft KI in place  Lab Results  Component Value Date   WBC 7.7 12/22/2018   HGB 10.7 (L) 12/22/2018   HCT 35.1 (L) 12/22/2018   MCV 97.2 12/22/2018   PLT 231 12/22/2018   BMET    Component Value Date/Time   NA 134 (L) 12/22/2018 0207   NA 143 02/13/2013 0927   K 4.1 12/22/2018 0207   CL 100 12/22/2018 0207   CO2 23 12/22/2018 0207   GLUCOSE 170 (H) 12/22/2018 0207   BUN 19 12/22/2018 0207   BUN 26 02/13/2013 0927   CREATININE 0.85 12/22/2018 0207   CALCIUM 8.7 (L) 12/22/2018 0207   GFRNONAA >60 12/22/2018 0207   GFRAA >60 12/22/2018 0207     Assessment/Plan: 1 Day Post-Op   Active Problems:   Displaced fracture of right patella   WBAT with walker KI at all times, do NOT bend the knee DVT ppx: apixaban, SCDs, TEDS PO pain control PT/OT Dispo: D/C home with HHPT   Hilton Cork Doralene Glanz 12/22/2018, 11:03 AM   Rod Can, MD [phone] [place] [facility]  [place] [address], [address], [place], [place] [address] Phone: [phone] [email or web address] Facebook  [employer]

## 2018-12-22 NOTE — Evaluation (Signed)
Physical Therapy Evaluation Patient Details Name: Brianna Burns MRN: [id number] DOB: 11/01/1934 Today's Date: 12/22/2018   History of Present Illness  Pt is 84 yo female who was admitted with R patella fracture.  She is s/p ORIF R patella on 11/20/18 (She was at home 3 weeks NWB prior to surgery) .  Orders for WBAT in knee immobilizer. Pt with PMH including arthritis, back surgery, DM, DVT, PE, panic attack after knee surgery, HTN.  Full PMH in H and P.  Clinical Impression   Pt was evaluated by PT and is a very motivated and active 84 yo.  She had good pain control and was able to transfer with min A and ambulate 20' with RW, despite not having ambulated in 3 weeks.  Pt and daughter with good understanding of exercises, transfers, and precautions.  Pt with good support at home and has DME including w/c if needed.  She presents with impairments below (see PT problem list) and will benefit from acute PT services to address while in hospital.     Follow Up Recommendations Home health PT    Equipment Recommendations  None recommended by PT(has dme)    Recommendations for Other Services       Precautions / Restrictions Precautions Precautions: Fall Required Braces or Orthoses: Knee Immobilizer - Right Knee Immobilizer - Right: On at all times Restrictions Weight Bearing Restrictions: No Other Position/Activity Restrictions: WBAT      Mobility  Bed Mobility Overal bed mobility: Needs Assistance Bed Mobility: Supine to Sit;Sit to Supine     Supine to sit: Min assist Sit to supine: Min assist   General bed mobility comments: educated on use of gait belt to assist with bed mobility  Transfers Overall transfer level: Needs assistance Equipment used: Rolling walker (2 wheeled) Transfers: Sit to/from Stand Sit to Stand: Min assist         General transfer comment: -performed sit to stand x 3 with cues for safe hand placement, backing all the way to chair, and to extend R LE  for sitting (daughter videoed correct technique); - performed toileting ADLs with min A and cues for safe hand placement  Ambulation/Gait Ambulation/Gait assistance: Min guard Gait Distance (Feet): 20 Feet Assistive device: Rolling walker (2 wheeled) Gait Pattern/deviations: Step-to pattern;Decreased stance time - right;Antalgic Gait velocity: decreased   General Gait Details: step to right gait with knee immobilizer; cued for RW ; ambulated 20' x2 with seated rest break  Stairs            Wheelchair Mobility    Modified Rankin (Stroke Patients Only)       Balance Overall balance assessment: Needs assistance Sitting-balance support: Bilateral upper extremity supported;Feet supported Sitting balance-Leahy Scale: Good Sitting balance - Comments: discussed stool under R LE for comfort if BSC too tall   Standing balance support: Bilateral upper extremity supported;During functional activity Standing balance-Leahy Scale: Fair                               Pertinent Vitals/Pain Pain Assessment: No/denies pain    Home Living Family/patient expects to be discharged to:: Private residence Living Arrangements: Spouse/significant other Available Help at Discharge: Family Type of Home: House Home Access: Ramped entrance     Home Layout: One level Home Equipment: Grab bars - tub/shower;Tub bench;Bedside commode;Hand held shower head;Walker - 2 wheels;Wheelchair - manual      Prior Function Level of Independence: Independent  Comments: Prior to patella fracture 3 weeks ago, pt was completely independent and active 84 yo.  Walked miles daily.   Since patella fracture she has not been able to ambulate due to NWB status.  Family and friends have been assisting with transfers, adls, and use of w/c.     Hand Dominance        Extremity/Trunk Assessment   Upper Extremity Assessment Upper Extremity Assessment: Overall WFL for tasks assessed    Lower  Extremity Assessment Lower Extremity Assessment: RLE deficits/detail RLE Deficits / Details: R ankle WFL ROM and 5/5; R hip WFL ROM and 3/5; R knee not tested in immobilizer       Communication   Communication: No difficulties  Cognition Arousal/Alertness: Awake/alert Behavior During Therapy: WFL for tasks assessed/performed Overall Cognitive Status: Within Functional Limits for tasks assessed                                 General Comments: Very motivated and sharp 84 yo female      General Comments General comments (skin integrity, edema, etc.): -increased time on education for transfers and HEP and knee immobilizer (family videoed all); discussed knee immobilizer placement and tight when OOB but could loosen at rest    Exercises Total Joint Exercises Ankle Circles/Pumps: AROM;Both;10 reps Hip ABduction/ADduction: AAROM;Right;10 reps Straight Leg Raises: AAROM;Right;10 reps   Assessment/Plan    PT Assessment Patient needs continued PT services  PT Problem List Decreased strength;Decreased mobility;Decreased range of motion;Decreased activity tolerance;Decreased balance;Decreased knowledge of use of DME       PT Treatment Interventions DME instruction;Therapeutic activities;Gait training;Therapeutic exercise;Patient/family education;Stair training;Balance training;Functional mobility training    PT Goals (Current goals can be found in the Care Plan section)  Acute Rehab PT Goals Patient Stated Goal: return home PT Goal Formulation: With patient/family Time For Goal Achievement: 01/05/19 Potential to Achieve Goals: Good    Frequency 7X/week   Barriers to discharge        Co-evaluation               AM-PAC PT "6 Clicks" Mobility  Outcome Measure Help needed turning from your back to your side while in a flat bed without using bedrails?: A Little Help needed moving from lying on your back to sitting on the side of a flat bed without using  bedrails?: A Little Help needed moving to and from a bed to a chair (including a wheelchair)?: A Little Help needed standing up from a chair using your arms (e.g., wheelchair or bedside chair)?: A Little Help needed to walk in hospital room?: None Help needed climbing 3-5 steps with a railing? : A Lot 6 Click Score: 18    End of Session Equipment Utilized During Treatment: Gait belt Activity Tolerance: Patient tolerated treatment well Patient left: in chair;with chair alarm set;with family/visitor present;with call bell/phone within reach Nurse Communication: Mobility status PT Visit Diagnosis: Unsteadiness on feet (R26.81);Muscle weakness (generalized) (M62.81);History of falling (Z91.81)    Time: 1045-1130 PT Time Calculation (min) (ACUTE ONLY): 45 min   Charges:   PT Evaluation $PT Eval Moderate Complexity: 1 Mod PT Treatments $Therapeutic Exercise: 8-22 mins $Therapeutic Activity: 8-22 mins        Maggie Font, PT Acute Rehab Services Pager [phone] [facility] [phone] [facility] [phone]   Karlton Lemon 12/22/2018, 12:11 PM

## 2018-12-24 ENCOUNTER — Observation Stay (HOSPITAL_COMMUNITY): Payer: Medicare Other

## 2018-12-24 ENCOUNTER — Other Ambulatory Visit: Payer: Self-pay

## 2018-12-24 ENCOUNTER — Encounter (HOSPITAL_COMMUNITY): Payer: Self-pay

## 2018-12-24 ENCOUNTER — Inpatient Hospital Stay (HOSPITAL_COMMUNITY)
Admission: EM | Admit: 2018-12-24 | Discharge: 2018-12-30 | DRG: 981 | Disposition: A | Discharge status: Skilled Nursing Facility | Payer: Medicare Other | Attending: Internal Medicine | Admitting: Internal Medicine

## 2018-12-24 DIAGNOSIS — Z20828 Contact with and (suspected) exposure to other viral communicable diseases: Secondary | ICD-10-CM | POA: Diagnosis present

## 2018-12-24 DIAGNOSIS — B9620 Unspecified Escherichia coli [E. coli] as the cause of diseases classified elsewhere: Secondary | ICD-10-CM | POA: Diagnosis present

## 2018-12-24 DIAGNOSIS — N390 Urinary tract infection, site not specified: Principal | ICD-10-CM | POA: Diagnosis present

## 2018-12-24 DIAGNOSIS — H409 Unspecified glaucoma: Secondary | ICD-10-CM | POA: Diagnosis present

## 2018-12-24 DIAGNOSIS — R5381 Other malaise: Secondary | ICD-10-CM | POA: Diagnosis present

## 2018-12-24 DIAGNOSIS — M1990 Unspecified osteoarthritis, unspecified site: Secondary | ICD-10-CM | POA: Diagnosis present

## 2018-12-24 DIAGNOSIS — Z90710 Acquired absence of both cervix and uterus: Secondary | ICD-10-CM

## 2018-12-24 DIAGNOSIS — Z8719 Personal history of other diseases of the digestive system: Secondary | ICD-10-CM

## 2018-12-24 DIAGNOSIS — S82001A Unspecified fracture of right patella, initial encounter for closed fracture: Secondary | ICD-10-CM | POA: Diagnosis present

## 2018-12-24 DIAGNOSIS — D649 Anemia, unspecified: Secondary | ICD-10-CM

## 2018-12-24 DIAGNOSIS — K219 Gastro-esophageal reflux disease without esophagitis: Secondary | ICD-10-CM | POA: Diagnosis present

## 2018-12-24 DIAGNOSIS — Z9842 Cataract extraction status, left eye: Secondary | ICD-10-CM

## 2018-12-24 DIAGNOSIS — E785 Hyperlipidemia, unspecified: Secondary | ICD-10-CM | POA: Diagnosis present

## 2018-12-24 DIAGNOSIS — E119 Type 2 diabetes mellitus without complications: Secondary | ICD-10-CM | POA: Diagnosis not present

## 2018-12-24 DIAGNOSIS — Z9001 Acquired absence of eye: Secondary | ICD-10-CM

## 2018-12-24 DIAGNOSIS — F419 Anxiety disorder, unspecified: Secondary | ICD-10-CM | POA: Diagnosis present

## 2018-12-24 DIAGNOSIS — Z86711 Personal history of pulmonary embolism: Secondary | ICD-10-CM

## 2018-12-24 DIAGNOSIS — Z7984 Long term (current) use of oral hypoglycemic drugs: Secondary | ICD-10-CM

## 2018-12-24 DIAGNOSIS — D638 Anemia in other chronic diseases classified elsewhere: Secondary | ICD-10-CM | POA: Diagnosis present

## 2018-12-24 DIAGNOSIS — Z8041 Family history of malignant neoplasm of ovary: Secondary | ICD-10-CM

## 2018-12-24 DIAGNOSIS — R7989 Other specified abnormal findings of blood chemistry: Secondary | ICD-10-CM | POA: Diagnosis present

## 2018-12-24 DIAGNOSIS — G9341 Metabolic encephalopathy: Secondary | ICD-10-CM | POA: Diagnosis not present

## 2018-12-24 DIAGNOSIS — Z86718 Personal history of other venous thrombosis and embolism: Secondary | ICD-10-CM

## 2018-12-24 DIAGNOSIS — R4182 Altered mental status, unspecified: Secondary | ICD-10-CM | POA: Diagnosis present

## 2018-12-24 DIAGNOSIS — Z8673 Personal history of transient ischemic attack (TIA), and cerebral infarction without residual deficits: Secondary | ICD-10-CM

## 2018-12-24 DIAGNOSIS — E871 Hypo-osmolality and hyponatremia: Secondary | ICD-10-CM | POA: Diagnosis present

## 2018-12-24 DIAGNOSIS — Z79891 Long term (current) use of opiate analgesic: Secondary | ICD-10-CM

## 2018-12-24 DIAGNOSIS — Z961 Presence of intraocular lens: Secondary | ICD-10-CM | POA: Diagnosis present

## 2018-12-24 DIAGNOSIS — H5461 Unqualified visual loss, right eye, normal vision left eye: Secondary | ICD-10-CM | POA: Diagnosis present

## 2018-12-24 DIAGNOSIS — I10 Essential (primary) hypertension: Secondary | ICD-10-CM | POA: Diagnosis present

## 2018-12-24 DIAGNOSIS — S82031A Displaced transverse fracture of right patella, initial encounter for closed fracture: Secondary | ICD-10-CM | POA: Diagnosis present

## 2018-12-24 DIAGNOSIS — Z888 Allergy status to other drugs, medicaments and biological substances status: Secondary | ICD-10-CM

## 2018-12-24 DIAGNOSIS — Z9841 Cataract extraction status, right eye: Secondary | ICD-10-CM

## 2018-12-24 DIAGNOSIS — F410 Panic disorder [episodic paroxysmal anxiety] without agoraphobia: Secondary | ICD-10-CM | POA: Diagnosis present

## 2018-12-24 DIAGNOSIS — X58XXXA Exposure to other specified factors, initial encounter: Secondary | ICD-10-CM | POA: Diagnosis present

## 2018-12-24 DIAGNOSIS — I2699 Other pulmonary embolism without acute cor pulmonale: Secondary | ICD-10-CM | POA: Diagnosis present

## 2018-12-24 DIAGNOSIS — Z79899 Other long term (current) drug therapy: Secondary | ICD-10-CM

## 2018-12-24 DIAGNOSIS — R000 Tachycardia, unspecified: Secondary | ICD-10-CM | POA: Diagnosis present

## 2018-12-24 DIAGNOSIS — Z7901 Long term (current) use of anticoagulants: Secondary | ICD-10-CM

## 2018-12-24 DIAGNOSIS — Z95828 Presence of other vascular implants and grafts: Secondary | ICD-10-CM

## 2018-12-24 DIAGNOSIS — Z9049 Acquired absence of other specified parts of digestive tract: Secondary | ICD-10-CM

## 2018-12-24 DIAGNOSIS — R404 Transient alteration of awareness: Secondary | ICD-10-CM

## 2018-12-24 DIAGNOSIS — Z885 Allergy status to narcotic agent status: Secondary | ICD-10-CM

## 2018-12-24 LAB — URINALYSIS, ROUTINE W REFLEX MICROSCOPIC
Bilirubin Urine: NEGATIVE
Glucose, UA: 50 mg/dL — AB
Hgb urine dipstick: NEGATIVE
Ketones, ur: 5 mg/dL — AB
Nitrite: NEGATIVE
Protein, ur: NEGATIVE mg/dL
Specific Gravity, Urine: 1.021 (ref 1.005–1.030)
pH: 5 (ref 5.0–8.0)

## 2018-12-24 LAB — COMPREHENSIVE METABOLIC PANEL
ALT: 12 U/L (ref 0–44)
AST: 18 U/L (ref 15–41)
Albumin: 3.3 g/dL — ABNORMAL LOW (ref 3.5–5.0)
Alkaline Phosphatase: 48 U/L (ref 38–126)
Anion gap: 11 (ref 5–15)
BUN: 19 mg/dL (ref 8–23)
CO2: 26 mmol/L (ref 22–32)
Calcium: 8.9 mg/dL (ref 8.9–10.3)
Chloride: 94 mmol/L — ABNORMAL LOW (ref 98–111)
Creatinine, Ser: 0.94 mg/dL (ref 0.44–1.00)
GFR calc Af Amer: >60 mL/min (ref >60)
GFR calc non Af Amer: 56 mL/min — ABNORMAL LOW (ref >60)
Glucose, Bld: 207 mg/dL — ABNORMAL HIGH (ref 70–99)
Potassium: 3.8 mmol/L (ref 3.5–5.1)
Sodium: 131 mmol/L — ABNORMAL LOW (ref 135–145)
Total Bilirubin: 1 mg/dL (ref 0.3–1.2)
Total Protein: 6.6 g/dL (ref 6.5–8.1)

## 2018-12-24 LAB — CBC WITH DIFFERENTIAL/PLATELET
Abs Immature Granulocytes: 0.04 10*3/uL (ref 0.00–0.07)
Basophils Absolute: 0 10*3/uL (ref 0.0–0.1)
Basophils Relative: 0 %
Eosinophils Absolute: 0 10*3/uL (ref 0.0–0.5)
Eosinophils Relative: 0 %
HCT: 33.7 % — ABNORMAL LOW (ref 36.0–46.0)
Hemoglobin: 10.5 g/dL — ABNORMAL LOW (ref 12.0–15.0)
Immature Granulocytes: 0 %
Lymphocytes Relative: 5 %
Lymphs Abs: 0.7 10*3/uL (ref 0.7–4.0)
MCH: 30 pg (ref 26.0–34.0)
MCHC: 31.2 g/dL (ref 30.0–36.0)
MCV: 96.3 fL (ref 80.0–100.0)
Monocytes Absolute: 1.5 10*3/uL — ABNORMAL HIGH (ref 0.1–1.0)
Monocytes Relative: 12 %
Neutro Abs: 10.2 10*3/uL — ABNORMAL HIGH (ref 1.7–7.7)
Neutrophils Relative %: 83 %
Platelets: 198 10*3/uL (ref 150–400)
RBC: 3.5 MIL/uL — ABNORMAL LOW (ref 3.87–5.11)
RDW: 13.1 % (ref 11.5–15.5)
WBC: 12.4 10*3/uL — ABNORMAL HIGH (ref 4.0–10.5)
nRBC: 0 % (ref 0.0–0.2)

## 2018-12-24 LAB — LACTIC ACID, PLASMA
Lactic Acid, Venous: 1.3 mmol/L (ref 0.5–1.9)
Lactic Acid, Venous: 1.3 mmol/L (ref 0.5–1.9)

## 2018-12-24 LAB — CBG MONITORING, ED: Glucose-Capillary: 197 mg/dL — ABNORMAL HIGH (ref 70–99)

## 2018-12-24 MED ORDER — METOPROLOL TARTRATE 5 MG/5ML IV SOLN
2.5000 mg | Freq: Once | INTRAVENOUS | Status: AC
  Administered 2018-12-24: 2.5 mg via INTRAVENOUS
  Filled 2018-12-24: qty 5

## 2018-12-24 MED ORDER — ACETAMINOPHEN 650 MG RE SUPP: 650.0000 mg | Freq: Four times a day (QID) | RECTAL | Status: DC | PRN

## 2018-12-24 MED ORDER — ACETAMINOPHEN 325 MG PO TABS
650.0000 mg | ORAL_TABLET | Freq: Four times a day (QID) | ORAL | Status: DC | PRN
  Administered 2018-12-28: 650 mg via ORAL
  Filled 2018-12-24 (×2): qty 2

## 2018-12-24 MED ORDER — FENTANYL CITRATE (PF) 100 MCG/2ML IJ SOLN
50.0000 ug | Freq: Once | INTRAMUSCULAR | Status: DC
  Filled 2018-12-24: qty 2

## 2018-12-24 MED ORDER — SODIUM CHLORIDE 0.9 % IV SOLN
INTRAVENOUS | Status: AC
  Administered 2018-12-24: 23:00:00 via INTRAVENOUS

## 2018-12-24 MED ORDER — TRAMADOL HCL 50 MG PO TABS
50.0000 mg | ORAL_TABLET | Freq: Once | ORAL | Status: AC | PRN
  Administered 2018-12-24: 50 mg via ORAL
  Filled 2018-12-24: qty 1

## 2018-12-24 MED ORDER — SODIUM CHLORIDE 0.9 % IV SOLN
1.0000 g | INTRAVENOUS | Status: DC
  Administered 2018-12-25 – 2018-12-26 (×2): 1 g via INTRAVENOUS
  Filled 2018-12-24: qty 1
  Filled 2018-12-24: qty 10
  Filled 2018-12-24: qty 1

## 2018-12-24 MED ORDER — LACTATED RINGERS IV BOLUS
1000.0000 mL | Freq: Once | INTRAVENOUS | Status: AC
  Administered 2018-12-24: 1000 mL via INTRAVENOUS

## 2018-12-24 MED ORDER — APIXABAN 5 MG PO TABS
5.0000 mg | ORAL_TABLET | Freq: Two times a day (BID) | ORAL | Status: DC
  Administered 2018-12-25 – 2018-12-30 (×13): 5 mg via ORAL
  Filled 2018-12-24 (×11): qty 1
  Filled 2018-12-24: qty 2
  Filled 2018-12-24: qty 1

## 2018-12-24 MED ORDER — INSULIN ASPART 100 UNIT/ML ~~LOC~~ SOLN
0.0000 [IU] | Freq: Three times a day (TID) | SUBCUTANEOUS | Status: DC
  Administered 2018-12-25: 2 [IU] via SUBCUTANEOUS
  Administered 2018-12-25 – 2018-12-28 (×6): 1 [IU] via SUBCUTANEOUS
  Filled 2018-12-24: qty 0.09

## 2018-12-24 MED ORDER — SODIUM CHLORIDE 0.9 % IV SOLN
1.0000 g | Freq: Once | INTRAVENOUS | Status: AC
  Administered 2018-12-24: 1 g via INTRAVENOUS
  Filled 2018-12-24: qty 10

## 2018-12-24 MED ORDER — INSULIN ASPART 100 UNIT/ML ~~LOC~~ SOLN
0.0000 [IU] | Freq: Every day | SUBCUTANEOUS | Status: DC
  Filled 2018-12-24: qty 0.05

## 2018-12-24 NOTE — ED Triage Notes (Addendum)
Patient arrived POV with daughter.   C/O altered mental status.   50 mg tramadol given at 1245 and 0545 this morning by her daughter. Daughter states patient has been unable to get up since medication was given and has been altered.   Last known well- 12/23/2018 11:45 pm   Daughter worried for UTI.   Broke her patella 3 weeks ago. Was told to let it heal for 2 weeks. [facility]. Patient then had surgery here on Wednesday and ORIF.   Patient allergic to pain medication.     Hx. DVT

## 2018-12-24 NOTE — ED Notes (Signed)
This RN called report to admitting floor. Waiting on CT of head before transporting patient.
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
## 2018-12-24 NOTE — ED Notes (Signed)
Disoriented X4 with this Therapist, sports.
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
## 2018-12-25 ENCOUNTER — Observation Stay (HOSPITAL_BASED_OUTPATIENT_CLINIC_OR_DEPARTMENT_OTHER): Payer: Medicare Other

## 2018-12-25 DIAGNOSIS — Z8673 Personal history of transient ischemic attack (TIA), and cerebral infarction without residual deficits: Secondary | ICD-10-CM | POA: Diagnosis not present

## 2018-12-25 DIAGNOSIS — R4182 Altered mental status, unspecified: Secondary | ICD-10-CM | POA: Diagnosis not present

## 2018-12-25 DIAGNOSIS — S82031A Displaced transverse fracture of right patella, initial encounter for closed fracture: Secondary | ICD-10-CM | POA: Diagnosis present

## 2018-12-25 DIAGNOSIS — E785 Hyperlipidemia, unspecified: Secondary | ICD-10-CM | POA: Diagnosis present

## 2018-12-25 DIAGNOSIS — N390 Urinary tract infection, site not specified: Secondary | ICD-10-CM | POA: Diagnosis present

## 2018-12-25 DIAGNOSIS — E871 Hypo-osmolality and hyponatremia: Secondary | ICD-10-CM | POA: Diagnosis present

## 2018-12-25 DIAGNOSIS — M1990 Unspecified osteoarthritis, unspecified site: Secondary | ICD-10-CM | POA: Diagnosis present

## 2018-12-25 DIAGNOSIS — Z20828 Contact with and (suspected) exposure to other viral communicable diseases: Secondary | ICD-10-CM | POA: Diagnosis present

## 2018-12-25 DIAGNOSIS — I361 Nonrheumatic tricuspid (valve) insufficiency: Secondary | ICD-10-CM

## 2018-12-25 DIAGNOSIS — Z7901 Long term (current) use of anticoagulants: Secondary | ICD-10-CM | POA: Diagnosis not present

## 2018-12-25 DIAGNOSIS — I10 Essential (primary) hypertension: Secondary | ICD-10-CM | POA: Diagnosis present

## 2018-12-25 DIAGNOSIS — Z9049 Acquired absence of other specified parts of digestive tract: Secondary | ICD-10-CM | POA: Diagnosis not present

## 2018-12-25 DIAGNOSIS — X58XXXA Exposure to other specified factors, initial encounter: Secondary | ICD-10-CM | POA: Diagnosis present

## 2018-12-25 DIAGNOSIS — Z95828 Presence of other vascular implants and grafts: Secondary | ICD-10-CM | POA: Diagnosis not present

## 2018-12-25 DIAGNOSIS — I340 Nonrheumatic mitral (valve) insufficiency: Secondary | ICD-10-CM | POA: Diagnosis not present

## 2018-12-25 DIAGNOSIS — Z8719 Personal history of other diseases of the digestive system: Secondary | ICD-10-CM | POA: Diagnosis not present

## 2018-12-25 DIAGNOSIS — Z8041 Family history of malignant neoplasm of ovary: Secondary | ICD-10-CM | POA: Diagnosis not present

## 2018-12-25 DIAGNOSIS — Z961 Presence of intraocular lens: Secondary | ICD-10-CM | POA: Diagnosis present

## 2018-12-25 DIAGNOSIS — H409 Unspecified glaucoma: Secondary | ICD-10-CM | POA: Diagnosis present

## 2018-12-25 DIAGNOSIS — D638 Anemia in other chronic diseases classified elsewhere: Secondary | ICD-10-CM | POA: Diagnosis not present

## 2018-12-25 DIAGNOSIS — Z86711 Personal history of pulmonary embolism: Secondary | ICD-10-CM | POA: Diagnosis not present

## 2018-12-25 DIAGNOSIS — Z9842 Cataract extraction status, left eye: Secondary | ICD-10-CM | POA: Diagnosis not present

## 2018-12-25 DIAGNOSIS — Z86718 Personal history of other venous thrombosis and embolism: Secondary | ICD-10-CM | POA: Diagnosis not present

## 2018-12-25 DIAGNOSIS — K219 Gastro-esophageal reflux disease without esophagitis: Secondary | ICD-10-CM | POA: Diagnosis present

## 2018-12-25 DIAGNOSIS — B9620 Unspecified Escherichia coli [E. coli] as the cause of diseases classified elsewhere: Secondary | ICD-10-CM | POA: Diagnosis present

## 2018-12-25 DIAGNOSIS — Z9841 Cataract extraction status, right eye: Secondary | ICD-10-CM | POA: Diagnosis not present

## 2018-12-25 DIAGNOSIS — R404 Transient alteration of awareness: Secondary | ICD-10-CM | POA: Diagnosis not present

## 2018-12-25 DIAGNOSIS — S82001S Unspecified fracture of right patella, sequela: Secondary | ICD-10-CM | POA: Diagnosis not present

## 2018-12-25 DIAGNOSIS — E1165 Type 2 diabetes mellitus with hyperglycemia: Secondary | ICD-10-CM | POA: Diagnosis not present

## 2018-12-25 DIAGNOSIS — G9341 Metabolic encephalopathy: Secondary | ICD-10-CM | POA: Diagnosis present

## 2018-12-25 DIAGNOSIS — Z90710 Acquired absence of both cervix and uterus: Secondary | ICD-10-CM | POA: Diagnosis not present

## 2018-12-25 DIAGNOSIS — E119 Type 2 diabetes mellitus without complications: Secondary | ICD-10-CM | POA: Diagnosis present

## 2018-12-25 LAB — COMPREHENSIVE METABOLIC PANEL
ALT: 11 U/L (ref 0–44)
AST: 16 U/L (ref 15–41)
Albumin: 3.1 g/dL — ABNORMAL LOW (ref 3.5–5.0)
Alkaline Phosphatase: 43 U/L (ref 38–126)
Anion gap: 10 (ref 5–15)
BUN: 14 mg/dL (ref 8–23)
CO2: 25 mmol/L (ref 22–32)
Calcium: 9 mg/dL (ref 8.9–10.3)
Chloride: 97 mmol/L — ABNORMAL LOW (ref 98–111)
Creatinine, Ser: 0.82 mg/dL (ref 0.44–1.00)
GFR calc Af Amer: >60 mL/min (ref >60)
GFR calc non Af Amer: >60 mL/min (ref >60)
Glucose, Bld: 229 mg/dL — ABNORMAL HIGH (ref 70–99)
Potassium: 3.8 mmol/L (ref 3.5–5.1)
Sodium: 132 mmol/L — ABNORMAL LOW (ref 135–145)
Total Bilirubin: 1.1 mg/dL (ref 0.3–1.2)
Total Protein: 6.3 g/dL — ABNORMAL LOW (ref 6.5–8.1)

## 2018-12-25 LAB — CBC
HCT: 33.5 % — ABNORMAL LOW (ref 36.0–46.0)
Hemoglobin: 10.5 g/dL — ABNORMAL LOW (ref 12.0–15.0)
MCH: 30.2 pg (ref 26.0–34.0)
MCHC: 31.3 g/dL (ref 30.0–36.0)
MCV: 96.3 fL (ref 80.0–100.0)
Platelets: 203 10*3/uL (ref 150–400)
RBC: 3.48 MIL/uL — ABNORMAL LOW (ref 3.87–5.11)
RDW: 12.8 % (ref 11.5–15.5)
WBC: 14 10*3/uL — ABNORMAL HIGH (ref 4.0–10.5)
nRBC: 0 % (ref 0.0–0.2)

## 2018-12-25 LAB — ECHOCARDIOGRAM COMPLETE: Weight: 2811.31 oz

## 2018-12-25 LAB — GLUCOSE, CAPILLARY
Glucose-Capillary: 206 mg/dL — ABNORMAL HIGH (ref 70–99)
Glucose-Capillary: 139 mg/dL — ABNORMAL HIGH (ref 70–99)
Glucose-Capillary: 156 mg/dL — ABNORMAL HIGH (ref 70–99)
Glucose-Capillary: 124 mg/dL — ABNORMAL HIGH (ref 70–99)
Glucose-Capillary: 118 mg/dL — ABNORMAL HIGH (ref 70–99)

## 2018-12-25 LAB — SEDIMENTATION RATE: Sed Rate: 38 mm/hr — ABNORMAL HIGH (ref 0–22)

## 2018-12-25 LAB — VITAMIN B12: Vitamin B-12: 182 pg/mL (ref 180–914)

## 2018-12-25 LAB — TROPONIN I (HIGH SENSITIVITY)
Troponin I (High Sensitivity): 23 ng/L — ABNORMAL HIGH (ref <18)
Troponin I (High Sensitivity): 27 ng/L — ABNORMAL HIGH (ref <18)

## 2018-12-25 LAB — RPR: RPR Ser Ql: NONREACTIVE

## 2018-12-25 LAB — D-DIMER, QUANTITATIVE: D-Dimer, Quant: 1.31 ug/mL-FEU — ABNORMAL HIGH (ref 0.00–0.50)

## 2018-12-25 LAB — SARS CORONAVIRUS 2 (TAT 6-24 HRS): SARS Coronavirus 2: NEGATIVE

## 2018-12-25 MED ORDER — PANTOPRAZOLE SODIUM 40 MG PO TBEC
40.0000 mg | DELAYED_RELEASE_TABLET | Freq: Every day | ORAL | Status: DC
  Administered 2018-12-25 – 2018-12-30 (×6): 40 mg via ORAL
  Filled 2018-12-25 (×6): qty 1

## 2018-12-25 MED ORDER — PHENAZOPYRIDINE HCL 100 MG PO TABS
100.0000 mg | ORAL_TABLET | Freq: Three times a day (TID) | ORAL | Status: DC
  Administered 2018-12-25 – 2018-12-27 (×6): 100 mg via ORAL
  Filled 2018-12-25 (×6): qty 1

## 2018-12-25 MED ORDER — HYDROXYZINE HCL 25 MG PO TABS
25.0000 mg | ORAL_TABLET | Freq: Three times a day (TID) | ORAL | Status: DC | PRN
  Administered 2018-12-25 – 2018-12-27 (×4): 25 mg via ORAL
  Filled 2018-12-25 (×5): qty 1

## 2018-12-25 MED ORDER — IMIPRAMINE HCL 25 MG PO TABS
25.0000 mg | ORAL_TABLET | Freq: Every day | ORAL | Status: DC
  Administered 2018-12-25 – 2018-12-30 (×7): 25 mg via ORAL
  Filled 2018-12-25 (×7): qty 1

## 2018-12-25 MED ORDER — ONDANSETRON HCL 4 MG PO TABS: 4.0000 mg | ORAL_TABLET | Freq: Four times a day (QID) | ORAL | Status: DC | PRN

## 2018-12-25 MED ORDER — TRAMADOL HCL 50 MG PO TABS
50.0000 mg | ORAL_TABLET | Freq: Four times a day (QID) | ORAL | Status: DC | PRN
  Administered 2018-12-25 – 2018-12-30 (×10): 50 mg via ORAL
  Filled 2018-12-25 (×11): qty 1

## 2018-12-25 MED ORDER — SENNA 8.6 MG PO TABS
2.0000 | ORAL_TABLET | Freq: Every day | ORAL | Status: DC
  Administered 2018-12-25 – 2018-12-28 (×4): 17.2 mg via ORAL
  Filled 2018-12-25 (×4): qty 2

## 2018-12-25 MED ORDER — POLYVINYL ALCOHOL 1.4 % OP SOLN: 1.0000 [drp] | Freq: Three times a day (TID) | OPHTHALMIC | Status: DC | PRN

## 2018-12-25 MED ORDER — SERTRALINE HCL 25 MG PO TABS
25.0000 mg | ORAL_TABLET | Freq: Every day | ORAL | Status: DC
  Administered 2018-12-25 – 2018-12-30 (×6): 25 mg via ORAL
  Filled 2018-12-25 (×6): qty 1

## 2018-12-25 MED ORDER — LOSARTAN POTASSIUM 25 MG PO TABS
25.0000 mg | ORAL_TABLET | Freq: Every day | ORAL | Status: DC
  Administered 2018-12-25 – 2018-12-30 (×6): 25 mg via ORAL
  Filled 2018-12-25 (×7): qty 1

## 2018-12-25 MED ORDER — DOCUSATE SODIUM 100 MG PO CAPS
100.0000 mg | ORAL_CAPSULE | Freq: Two times a day (BID) | ORAL | Status: DC
  Administered 2018-12-25 – 2018-12-29 (×7): 100 mg via ORAL
  Filled 2018-12-25 (×9): qty 1

## 2018-12-25 MED ORDER — ROSUVASTATIN CALCIUM 10 MG PO TABS
10.0000 mg | ORAL_TABLET | Freq: Every day | ORAL | Status: DC
  Administered 2018-12-25 – 2018-12-30 (×5): 10 mg via ORAL
  Filled 2018-12-25 (×6): qty 1

## 2018-12-25 NOTE — Progress Notes (Signed)
PHARMACY NOTE -  Eliquis  Pharmacy has been assisting with dosing of Eliquis for chronic treatment of VTE.  Dosage remains stable at 5 mg PO daily and need for further dosage adjustment appears unlikely at present given SCr stable < 1.5  Pharmacy will sign off, following peripherally for culture results or dose adjustments. Please reconsult if a change in clinical status warrants re-evaluation of dosage.  Reuel Boom, PharmD, BCPS [phone] 12/25/2018, 12:45 PM

## 2018-12-25 NOTE — Progress Notes (Signed)
PROGRESS NOTE  Brianna Burns [id number] DOB: [birth year] DOA: 12/24/2018 PCP: Christain Sacramento, MD  HPI/Recap of past 24 hours: This is a 84 year old female with GERD hypertension, hyperlipidemia, type 2 diabetes mellitus, TIA on Jul 05, 2018 recurrent DVT and pulmonary embolism most recent one was September 26, 2018 status post IVC filter December 19, 2018 with recent admission December 21, 2018 for displaced right patella fracture status post ORIF.  She was admitted with altered mental status found to have urinary tract infection which is most likely the cause of her altered mental status  Subjective: Patient seen and examined at bedside she was sleepy and slightly drowsy she has had mittens on her daughter needs Danielle Rankin was at bedside who gave me history she stated that she had been doing very well up until Friday night and that she was legally blind  Assessment/Plan: Principal Problem:   AMS (altered mental status) Active Problems:   HTN (hypertension)   Diabetes ([place])   Acute lower UTI   Hyponatremia   Anemia Plan 1.  Altered mental status likely secondary to urinary tract infection brain CT was normal  2.  Acute urinary tract infection blood cultures are still pending she is on Rocephin we will continue Rocephin.  3.  Tachycardia with elevated troponin tachycardia is resolved continue metoprolol echocardiogram was done and is still pending.  4.  History of recurrent DVT with pulmonary embolism status post IVC filter placement continue Eliquis  Hypertension continue metoprolol and losartan  5.  Hyperlipidemia on Crestor  6.  Type 2 diabetes mellitus Metformin is on hold we will continue sliding scale insulin  7.  Anxiety continue Zoloft and imipramine  8.  Recent right patella fracture repair continue tramadol as needed for pain  Code Status: Full  Severity of Illness: The appropriate patient status for this patient is INPATIENT. Inpatient status is judged to be  reasonable and necessary in order to provide the required intensity of service to ensure the patient's safety. The patient's presenting symptoms, physical exam findings, and initial radiographic and laboratory data in the context of their chronic comorbidities is felt to place them at high risk for further clinical deterioration. Furthermore, it is not anticipated that the patient will be medically stable for discharge from the hospital within 2 midnights of admission. The following factors support the patient status of inpatient.   " The patient's presenting symptoms include altered mental status. " The worrisome physical exam findings include altered mental status. " The initial radiographic and laboratory data are worrisome because of urinary tract infection. " The chronic co-morbidities include DVT pulmonary embolism.   * I certify that at the point of admission it is my clinical judgment that the patient will require inpatient hospital care spanning beyond 2 midnights from the point of admission due to high intensity of service, high risk for further deterioration and high frequency of surveillance required.*    Family Communication: Daughter at bedside Sherri Bayside  Disposition Plan: Home when stable   Consultants:  None  Procedures:  None  Antimicrobials:  Rocephin  DVT prophylaxis: Apixaban   Objective: Vitals:   12/24/18 2345 12/25/18 0024 12/25/18 0038 12/25/18 0454  BP: (!) 145/73 (!) 169/62  139/71  Pulse: 99 97  98  Resp:  (!) 24  20  Temp:  97.9 F (36.6 C)  99.2 F (37.3 C)  TempSrc:  Oral  Axillary  SpO2: 92% 95%  93%  Weight:   79.7 kg  Intake/Output Summary (Last 24 hours) at 12/25/2018 1134 Last data filed at 12/25/2018 0700 Gross per 24 hour  Intake 1736.5 ml  Output 800 ml  Net 936.5 ml   Filed Weights   12/25/18 0038  Weight: 79.7 kg   Body mass index is 31.63 kg/m.  Exam:  . General: 84 y.o. year-old female well developed well  nourished in no acute distress.  Alert and oriented x3. . Cardiovascular: Regular rate and rhythm with no rubs or gallops.  No thyromegaly or JVD noted.   Marland Kitchen Respiratory: Clear to auscultation with no wheezes or rales. Good inspiratory effort. . Abdomen: Soft nontender nondistended with normal bowel sounds x4 quadrants. . Musculoskeletal: No lower extremity edema. 2/4 pulses in all 4 extremities. . Skin: No ulcerative lesions noted or rashes, . Psychiatry: Mood is appropriate for condition and setting    Data Reviewed: CBC: Recent Labs  Lab 12/19/18 1303 12/21/18 1230 12/22/18 0207 12/24/18 2016 12/25/18 0353  WBC  --  5.7 7.7 12.4* 14.0*  NEUTROABS  --   --   --  10.2*  --   HGB 12.2 11.6* 10.7* 10.5* 10.5*  HCT 36.0 38.3 35.1* 33.7* 33.5*  MCV  --  99.0 97.2 96.3 96.3  PLT  --  243 231 198 [id number]   Basic Metabolic Panel: Recent Labs  Lab 12/19/18 1303 12/21/18 1230 12/22/18 0207 12/24/18 2016 12/25/18 0353  NA 138 139 134* 131* 132*  K 4.3 4.1 4.1 3.8 3.8  CL 99 102 100 94* 97*  CO2  --  29 23 26 25   GLUCOSE 115* 136* 170* 207* 229*  BUN 26* 23 19 19 14   CREATININE 1.10* 1.07* 0.85 0.94 0.82  CALCIUM  --  9.8 8.7* 8.9 9.0   GFR: Estimated Creatinine Clearance: 52.4 mL/min (by C-G formula based on SCr of 0.82 mg/dL). Liver Function Tests: Recent Labs  Lab 12/24/18 2016 12/25/18 0353  AST 18 16  ALT 12 11  ALKPHOS 48 43  BILITOT 1.0 1.1  PROT 6.6 6.3*  ALBUMIN 3.3* 3.1*   No results for input(s): LIPASE, AMYLASE in the last 168 hours. No results for input(s): AMMONIA in the last 168 hours. Coagulation Profile: Recent Labs  Lab 12/21/18 1246  INR 1.1   Cardiac Enzymes: No results for input(s): CKTOTAL, CKMB, CKMBINDEX, TROPONINI in the last 168 hours. BNP (last 3 results) No results for input(s): PROBNP in the last 8760 hours. HbA1C: No results for input(s): HGBA1C in the last 72 hours. CBG: Recent Labs  Lab 12/22/18 0706 12/22/18 1151  12/24/18 2315 12/25/18 0104 12/25/18 0839  GLUCAP 125* 114* 197* 206* 139*   Lipid Profile: No results for input(s): CHOL, HDL, LDLCALC, TRIG, CHOLHDL, LDLDIRECT in the last 72 hours. Thyroid Function Tests: No results for input(s): TSH, T4TOTAL, FREET4, T3FREE, THYROIDAB in the last 72 hours. Anemia Panel: Recent Labs    12/25/18 0353  VITAMINB12 182   Urine analysis:    Component Value Date/Time   COLORURINE YELLOW 12/24/2018 1855   APPEARANCEUR HAZY (A) 12/24/2018 1855   LABSPEC 1.021 12/24/2018 1855   PHURINE 5.0 12/24/2018 1855   GLUCOSEU 50 (A) 12/24/2018 1855   HGBUR NEGATIVE 12/24/2018 1855   BILIRUBINUR NEGATIVE 12/24/2018 1855   KETONESUR 5 (A) 12/24/2018 1855   PROTEINUR NEGATIVE 12/24/2018 1855   NITRITE NEGATIVE 12/24/2018 1855   LEUKOCYTESUR TRACE (A) 12/24/2018 1855   Sepsis Labs: @LABRCNTIP (procalcitonin:4,lacticidven:4)  ) Recent Results (from the past 240 hour(s))  SARS CORONAVIRUS 2 (TAT 6-24 HRS)  Nasopharyngeal Nasopharyngeal Swab     Status: None   Collection Time: 12/17/18  4:19 PM   Specimen: Nasopharyngeal Swab  Result Value Ref Range Status   SARS Coronavirus 2 NEGATIVE NEGATIVE Final    Comment: (NOTE) SARS-CoV-2 target nucleic acids are NOT DETECTED. The SARS-CoV-2 RNA is generally detectable in upper and lower respiratory specimens during the acute phase of infection. Negative results do not preclude SARS-CoV-2 infection, do not rule out co-infections with other pathogens, and should not be used as the sole basis for treatment or other patient management decisions. Negative results must be combined with clinical observations, patient history, and epidemiological information. The expected result is Negative. Fact Sheet for Patients: SugarRoll.be Fact Sheet for Healthcare Providers: https://www.woods-mathews.com/ This test is not yet approved or cleared by the [place] FDA and  has been  authorized for detection and/or diagnosis of SARS-CoV-2 by FDA under an Emergency Use Authorization (EUA). This EUA will remain  in effect (meaning this test can be used) for the duration of the COVID-19 declaration under Section 56 4(b)(1) of the Act, 21 U.S.C. section 360bbb-3(b)(1), unless the authorization is terminated or revoked sooner. Performed at [facility], [place] [address], [place], [place] [address]   SARS CORONAVIRUS 2 (TAT 6-24 HRS) Nasopharyngeal     Status: None   Collection Time: 12/24/18  6:55 PM   Specimen: Nasopharyngeal  Result Value Ref Range Status   SARS Coronavirus 2 NEGATIVE NEGATIVE Final    Comment: (NOTE) SARS-CoV-2 target nucleic acids are NOT DETECTED. The SARS-CoV-2 RNA is generally detectable in upper and lower respiratory specimens during the acute phase of infection. Negative results do not preclude SARS-CoV-2 infection, do not rule out co-infections with other pathogens, and should not be used as the sole basis for treatment or other patient management decisions. Negative results must be combined with clinical observations, patient history, and epidemiological information. The expected result is Negative. Fact Sheet for Patients: SugarRoll.be Fact Sheet for Healthcare Providers: https://www.woods-mathews.com/ This test is not yet approved or cleared by the [place] FDA and  has been authorized for detection and/or diagnosis of SARS-CoV-2 by FDA under an Emergency Use Authorization (EUA). This EUA will remain  in effect (meaning this test can be used) for the duration of the COVID-19 declaration under Section 56 4(b)(1) of the Act, 21 U.S.C. section 360bbb-3(b)(1), unless the authorization is terminated or revoked sooner. Performed at [facility], [place] [address] [place],  [address]       Studies: Ct Head Wo Contrast  Result Date: 12/24/2018 CLINICAL DATA:  Altered mental  status EXAM: CT HEAD WITHOUT CONTRAST TECHNIQUE: Contiguous axial images were obtained from the base of the skull through the vertex without intravenous contrast. COMPARISON:  None. FINDINGS: Brain: Chronic atrophic changes and ischemic changes are seen. No findings to suggest acute hemorrhage, acute infarction or space-occupying mass lesion are seen. Vascular: No hyperdense vessel or unexpected calcification. Skull: Normal. Negative for fracture or focal lesion. Sinuses/Orbits: No acute finding. Other: None. IMPRESSION: Chronic atrophic and ischemic changes without acute abnormality. Electronically Signed   By: Inez Catalina M.D.   On: 12/24/2018 23:54    Scheduled Meds: . apixaban  5 mg Oral BID  . docusate sodium  100 mg Oral BID  . fentaNYL (SUBLIMAZE) injection  50 mcg Intravenous Once  . imipramine  25 mg Oral QHS  . insulin aspart  0-5 Units Subcutaneous QHS  . insulin aspart  0-9 Units Subcutaneous TID WC  .  losartan  25 mg Oral Daily  . pantoprazole  40 mg Oral Daily  . rosuvastatin  10 mg Oral q1800  . senna  2 tablet Oral QHS  . sertraline  25 mg Oral Daily    Continuous Infusions: . cefTRIAXone (ROCEPHIN)  IV       LOS: 0 days     Cristal Deer, MD [facility]  To reach me or the doctor on call, go to: [email or web address] Password [facility]  12/25/2018, 11:34 AM

## 2018-12-25 NOTE — Progress Notes (Signed)
Pt. Has a BM upon coming onto the floor @ approximately 0038. Medium sausage like stool.

## 2018-12-25 NOTE — Progress Notes (Signed)
  Echocardiogram 2D Echocardiogram has been performed.  Brianna Burns G Brianna Burns 12/25/2018, 11:03 AM

## 2018-12-25 NOTE — ED Notes (Addendum)
Transporting pt to 4E. Daughter Barbera Setters at bedside. Sheri taking pt cell phone with her due to pt mental status. Pt does not have any belongings with her. Barbera Setters said pt has not had a bowel movement since 12/19/18. Sheri gave senokot, colace on the morning of 12/24/18, but she did not give pt these meds twice a day since surgery on 12/21/18.

## 2018-12-26 ENCOUNTER — Other Ambulatory Visit: Payer: Self-pay

## 2018-12-26 LAB — ANA: Anti Nuclear Antibody (ANA): NEGATIVE

## 2018-12-26 LAB — GLUCOSE, CAPILLARY
Glucose-Capillary: 108 mg/dL — ABNORMAL HIGH (ref 70–99)
Glucose-Capillary: 124 mg/dL — ABNORMAL HIGH (ref 70–99)
Glucose-Capillary: 114 mg/dL — ABNORMAL HIGH (ref 70–99)
Glucose-Capillary: 154 mg/dL — ABNORMAL HIGH (ref 70–99)

## 2018-12-26 MED ORDER — POTASSIUM CHLORIDE CRYS ER 20 MEQ PO TBCR
40.0000 meq | EXTENDED_RELEASE_TABLET | Freq: Every day | ORAL | Status: AC
  Administered 2018-12-26 – 2018-12-28 (×3): 40 meq via ORAL
  Filled 2018-12-26 (×3): qty 2

## 2018-12-26 MED ORDER — CYANOCOBALAMIN 1000 MCG/ML IJ SOLN
1000.0000 ug | Freq: Every day | INTRAMUSCULAR | Status: DC
  Administered 2018-12-27 – 2018-12-30 (×4): 1000 ug via INTRAMUSCULAR
  Filled 2018-12-26 (×5): qty 1

## 2018-12-26 NOTE — TOC Initial Note (Signed)
Transition of Care [facility]) - Initial/Assessment Note    Patient Details  Name: Brianna Burns MRN: [id number] Date of Birth: 05-14-1934  Transition of Care [facility]) CM/SW Contact:    Dessa Phi, RN Phone Number: 12/26/2018, 11:25 AM  Clinical Narrative: Patient was discharged rom [facility] services, they can accept if [place] needed. Await PT/OT recc.                 Expected Discharge Plan: [place] Barriers to Discharge: Continued Medical Work up   Patient Goals and CMS Choice Patient states their goals for this hospitalization and ongoing recovery are:: go home CMS Medicare.gov Compare Post Acute Care list provided to:: Patient    Expected Discharge Plan and Services Expected Discharge Plan: [place]   Discharge Planning Services: CM Consult   Living arrangements for the past 2 months: Single Family Home                                      Prior Living Arrangements/Services Living arrangements for the past 2 months: Single Family Home Lives with:: Spouse Patient language and need for interpreter reviewed:: Yes Do you feel safe going back to the place where you live?: Yes      Need for Family Participation in Patient Care: No (Comment) Care giver support system in place?: Yes (comment) Current home services: Other (comment)(Patient has completed [place] with [facility].) Criminal Activity/Legal Involvement Pertinent to Current Situation/Hospitalization: No - Comment as needed  Activities of Daily Living Home Assistive Devices/Equipment: Bedside commode/3-in-1, Cane (specify quad or straight), Brace (specify type), Eyeglasses, Grab bars in shower, Hand-held shower hose, CBG Meter, Walker (specify type), Wheelchair, Other (Comment)(front wheeled walker, single point cane, ramp entrance, tub bench, knee brace, tub/shower unit) ADL Screening (condition at time of admission) Patient's cognitive ability adequate to safely complete daily  activities?: No Is the patient deaf or have difficulty hearing?: No Does the patient have difficulty seeing, even when wearing glasses/contacts?: Yes Does the patient have difficulty concentrating, remembering, or making decisions?: Yes Patient able to express need for assistance with ADLs?: Yes Does the patient have difficulty dressing or bathing?: Yes Independently performs ADLs?: No Communication: Needs assistance Is this a change from baseline?: Pre-admission baseline Dressing (OT): Needs assistance Is this a change from baseline?: Pre-admission baseline Grooming: Needs assistance Is this a change from baseline?: Pre-admission baseline Feeding: Needs assistance Is this a change from baseline?: Pre-admission baseline Bathing: Needs assistance Is this a change from baseline?: Pre-admission baseline Toileting: Needs assistance Is this a change from baseline?: Pre-admission baseline In/Out Bed: Needs assistance Is this a change from baseline?: Pre-admission baseline Walks in Home: Needs assistance Is this a change from baseline?: Pre-admission baseline Does the patient have difficulty walking or climbing stairs?: Yes Weakness of Legs: Both(Knee splint) Weakness of Arms/Hands: Left  Permission Sought/Granted Permission sought to share information with : Case Manager Permission granted to share information with : Yes, Verbal Permission Granted              Emotional Assessment Appearance:: Appears stated age Attitude/Demeanor/Rapport: Gracious Affect (typically observed): Accepting Orientation: : Oriented to Self, Oriented to Place, Oriented to  Time, Oriented to Situation Alcohol / Substance Use: Not Applicable Psych Involvement: No (comment)  Admission diagnosis:  Post Op Surgery Pain  Patient Active Problem List   Diagnosis Date Noted  . AMS (altered mental  status) 12/24/2018  . Acute lower UTI 12/24/2018  . Hyponatremia 12/24/2018  . Anemia 12/24/2018  . Displaced  fracture of right patella 12/21/2018  . HTN (hypertension) 06/27/2012  . Diabetes ([place]) 06/27/2012  . GERD (gastroesophageal reflux disease) 06/27/2012  . Nocturia 06/27/2012   PCP:  Christain Sacramento, MD Pharmacy:   [facility] [address], [place] [address] [place] [address] Phone: [phone] Fax: [phone]     Social Determinants of Health (SDOH) Interventions    Readmission Risk Interventions No flowsheet data found.

## 2018-12-27 LAB — BASIC METABOLIC PANEL
Anion gap: 7 (ref 5–15)
BUN: 20 mg/dL (ref 8–23)
CO2: 27 mmol/L (ref 22–32)
Calcium: 8.9 mg/dL (ref 8.9–10.3)
Chloride: 100 mmol/L (ref 98–111)
Creatinine, Ser: 1.12 mg/dL — ABNORMAL HIGH (ref 0.44–1.00)
GFR calc Af Amer: 53 mL/min — ABNORMAL LOW (ref >60)
GFR calc non Af Amer: 46 mL/min — ABNORMAL LOW (ref >60)
Glucose, Bld: 129 mg/dL — ABNORMAL HIGH (ref 70–99)
Potassium: 4.2 mmol/L (ref 3.5–5.1)
Sodium: 134 mmol/L — ABNORMAL LOW (ref 135–145)

## 2018-12-27 LAB — TSH: TSH: 2.306 u[IU]/mL (ref 0.350–4.500)

## 2018-12-27 LAB — GLUCOSE, CAPILLARY
Glucose-Capillary: 114 mg/dL — ABNORMAL HIGH (ref 70–99)
Glucose-Capillary: 128 mg/dL — ABNORMAL HIGH (ref 70–99)
Glucose-Capillary: 107 mg/dL — ABNORMAL HIGH (ref 70–99)
Glucose-Capillary: 147 mg/dL — ABNORMAL HIGH (ref 70–99)

## 2018-12-27 LAB — URINE CULTURE: Culture: >=100000 — AB

## 2018-12-27 MED ORDER — CEFDINIR 300 MG PO CAPS
300.0000 mg | ORAL_CAPSULE | Freq: Two times a day (BID) | ORAL | Status: DC
  Administered 2018-12-27 – 2018-12-30 (×7): 300 mg via ORAL
  Filled 2018-12-27 (×7): qty 1

## 2018-12-27 NOTE — Evaluation (Signed)
Occupational Therapy Evaluation Patient Details Name: Brianna Burns MRN: [id number] DOB: 03-29-1934 Today's Date: 12/27/2018    History of Present Illness 84 year old female was admitted with AMS, uti.   Admitted last week s/p ORIF R patella (11/11), also s/p IV filter. PMH:  arthritis, back sx   Clinical Impression   Pt was admitted for the above.  She reports that daughter was assisting her at baseline.  Pt admitted with AMS; she was able to follow commands and answer questions. Will follow in acute setting with the goals listed below. Pt does not want to go to SNF; do not think her family can manage her at this level     Follow Up Recommendations  SNF    Equipment Recommendations  None recommended by OT    Recommendations for Other Services       Precautions / Restrictions Precautions Precautions: Fall Required Braces or Orthoses: Knee Immobilizer - Right Knee Immobilizer - Right: On at all times Restrictions Other Position/Activity Restrictions: WBAT      Mobility Bed Mobility     Rolling: Max assist;+2 for physical assistance   Supine to sit: Max assist;Total assist Sit to supine: Total assist;+2 for physical assistance      Transfers   Equipment used: Rolling walker (2 wheeled)   Sit to Stand: Total assist;+2 physical assistance         General transfer comment: pt cleared hips from bed but not enough to remove chux pads    Balance     Sitting balance-Leahy Scale: Fair                                     ADL either performed or assessed with clinical judgement   ADL Overall ADL's : Needs assistance/impaired Eating/Feeding: Set up   Grooming: Set up   Upper Body Bathing: Minimal assistance   Lower Body Bathing: Total assistance;+2 for physical assistance;Bed level   Upper Body Dressing : Minimal assistance   Lower Body Dressing: Total assistance;+2 for physical assistance;Bed level                 General ADL  Comments: sat EOB and brushed teeth. Pt wanted to wait until daughter was here for adls.  Pt's chux pads were wet:  NT and I attempted to stand pt to remove these. Pt unable with total +2 assist. Rolled to accomplish this     Vision         Perception     Praxis      Pertinent Vitals/Pain Pain Assessment: Faces Faces Pain Scale: Hurts even more Pain Location: R knee with movement Pain Descriptors / Indicators: Grimacing Pain Intervention(s): Limited activity within patient's tolerance;Monitored during session;Repositioned(pt declines pain meds)     Hand Dominance     Extremity/Trunk Assessment Upper Extremity Assessment Upper Extremity Assessment: Overall WFL for tasks assessed(good grips)      Pt uses arms to help LLE move across bed. She needs A with RLE also, which is in [employer]     Communication Communication Communication: No difficulties   Cognition Arousal/Alertness: Awake/alert Behavior During Therapy: WFL for tasks assessed/performed Overall Cognitive Status: No family/caregiver present to determine baseline cognitive functioning                                 General Comments: follows commands  and answered PLOF questions   General Comments       Exercises     Shoulder Instructions      Home Living Family/patient expects to be discharged to:: Unsure Living Arrangements: Spouse/significant other Available Help at Discharge: Family Type of Home: House             Bathroom Shower/Tub: Teaching laboratory technician Toilet: Standard     Home Equipment: Grab bars - tub/shower;Tub bench;Bedside commode;Hand held shower head;Walker - 2 wheels;Wheelchair - manual   Additional Comments: was from home with husband and daughter lives 3 doors away      Prior Functioning/Environment          Comments: daughter was assisting with adls prior to this admission. Pt states she was getting up to [facility]        OT Problem List: Decreased  strength;Decreased activity tolerance;Pain;Decreased knowledge of use of DME or AE      OT Treatment/Interventions: Self-care/ADL training;Energy conservation;DME and/or AE instruction;Therapeutic activities;Patient/family education;Balance training    OT Goals(Current goals can be found in the care plan section) Acute Rehab OT Goals Patient Stated Goal: return home OT Goal Formulation: With patient Time For Goal Achievement: 01/10/19 Potential to Achieve Goals: Good ADL Goals Pt Will Transfer to Toilet: with max assist;with +2 assist;stand pivot transfer;bedside commode Additional ADL Goal #1: pt will go from sit to stand with mod +2 assistance for adls and maintain for 2 minutes with min A Additional ADL Goal #2: Pt will perform bed mobility with mod A in preparation for adls  OT Frequency: Min 2X/week   Barriers to D/C:            Co-evaluation              AM-PAC OT "6 Clicks" Daily Activity     Outcome Measure Help from another person eating meals?: A Little Help from another person taking care of personal grooming?: A Little Help from another person toileting, which includes using toliet, bedpan, or urinal?: Total Help from another person bathing (including washing, rinsing, drying)?: A Lot Help from another person to put on and taking off regular upper body clothing?: A Little Help from another person to put on and taking off regular lower body clothing?: Total 6 Click Score: 13   End of Session    Activity Tolerance: Patient limited by pain Patient left: in bed;with call bell/phone within reach;with bed alarm set  OT Visit Diagnosis: Muscle weakness (generalized) (M62.81);Unsteadiness on feet (R26.81);Pain Pain - Right/Left: Right Pain - part of body: Knee                Time: 0825-0858 OT Time Calculation (min): 33 min Charges:  OT General Charges $OT Visit: 1 Visit OT Evaluation $OT Eval Low Complexity: 1 Low OT Treatments $Self Care/Home Management :  8-22 mins  Brianna Burns, OTR/L Acute Rehabilitation Services [phone] [facility] pager [phone] office 12/27/2018  Brianna Burns 12/27/2018, 10:11 AM

## 2018-12-27 NOTE — Evaluation (Signed)
Physical Therapy Evaluation Patient Details Name: Brianna Burns MRN: [id number] DOB: 1934-10-07 Today's Date: 12/27/2018   History of Present Illness  84 year old female was admitted with AMS, uti.   Admitted last week s/p ORIF R patella (11/11), also s/p IV filter. PMH:  arthritis, back sx  Clinical Impression  Pt presents with significant dependencies in mobility secondary to the above diagnosis. Pt was oriented, but demonstrated decreased processing, problem solving, and safety awareness. Pt's daughter expressed desire for SNF placement. I spoke at length with pt/daughter and explained the benefits of SNF placement.  Pt continued to decline. Pt is currently +2 total assist with mobility. Pt was able to sit EOB with me max assist and required balance support. Pt was unable to stand at this time. Do not feel family will be able to provide the necessary care needed. Pt will continue to benefit from acute skilled PT to maximize mobility and Independence for next venue of care.     Follow Up Recommendations SNF    Equipment Recommendations  None recommended by PT    Recommendations for Other Services       Precautions / Restrictions Precautions Precautions: Fall Required Braces or Orthoses: Knee Immobilizer - Right Knee Immobilizer - Right: On at all times Restrictions Weight Bearing Restrictions: No Other Position/Activity Restrictions: WBAT      Mobility  Bed Mobility Overal bed mobility: Needs Assistance Bed Mobility: Rolling;Sidelying to Sit Rolling: Mod assist Sidelying to sit: Max assist Supine to sit: Max assist;Total assist Sit to supine: Max assist;+2 for physical assistance   General bed mobility comments: daughter assisted with bed positioning, pt total assist to slide up in bed  Transfers Overall transfer level: Needs assistance Equipment used: Rolling walker (2 wheeled) Transfers: Sit to/from Stand Sit to Stand: +2 physical assistance;Total assist          General transfer comment: pt unsuccessful in attempt to stand.  Ambulation/Gait                Stairs            Wheelchair Mobility    Modified Rankin (Stroke Patients Only)       Balance Overall balance assessment: Needs assistance Sitting-balance support: No upper extremity supported Sitting balance-Leahy Scale: Poor Sitting balance - Comments: pt required mod support initiallt to maintain static balance. after a few mins pt was able to maintain static sitting with supervision. Postural control: Posterior lean                                   Pertinent Vitals/Pain Pain Assessment: Faces Faces Pain Scale: Hurts a little bit Pain Location: R knee with movement Pain Descriptors / Indicators: Grimacing Pain Intervention(s): Limited activity within patient's tolerance;Monitored during session;Repositioned    Home Living Family/patient expects to be discharged to:: Unsure Living Arrangements: Spouse/significant other Available Help at Discharge: Family Type of Home: House Home Access: Ramped entrance     Home Layout: One level Home Equipment: Grab bars - tub/shower;Tub bench;Bedside commode;Hand held shower head;Walker - 2 wheels;Wheelchair - manual Additional Comments: husband had a previous stroke and has balance issues per pt's daughter.    Prior Function           Comments: daughter reports she was unable to walk since patella fx after d/c home due to pain     Hand Dominance        Extremity/Trunk  Assessment   Upper Extremity Assessment Upper Extremity Assessment: Defer to OT evaluation    Lower Extremity Assessment Lower Extremity Assessment: Generalized weakness RLE Deficits / Details: R hip flexion 2/5, ankle WFL, Left LE difficulty following commands appears 2-3/5       Communication   Communication: No difficulties  Cognition Arousal/Alertness: Awake/alert Behavior During Therapy: WFL for tasks  assessed/performed Overall Cognitive Status: Impaired/Different from baseline Area of Impairment: Problem solving;Following commands;Safety/judgement;Awareness                       Following Commands: Follows one step commands inconsistently;Follows one step commands with increased time Safety/Judgement: Decreased awareness of safety;Decreased awareness of deficits Awareness: Intellectual Problem Solving: Slow processing;Requires verbal cues;Requires tactile cues;Difficulty sequencing General Comments: follows commands and answered PLOF questions      General Comments General comments (skin integrity, edema, etc.): Pt daughter was present. Discussed with patient and daughter need for rehab prior to d/c home. Pt reported she would not go to SNF. Inspected R LE per daughter request. removed ace wrap and skin looked intact and no signs on irritation. Ace wrap reapplied and KI placed back on.    Exercises General Exercises - Lower Extremity Ankle Circles/Pumps: AROM;Strengthening;Both;10 reps;Supine Long Arc Quad: AROM;Strengthening;Left;Seated;10 reps Heel Slides: AAROM;Strengthening;Left;Supine Hip ABduction/ADduction: AAROM;Strengthening;Both;10 reps;Supine Straight Leg Raises: AAROM;Strengthening;Both;10 reps;Supine   Assessment/Plan    PT Assessment Patient needs continued PT services  PT Problem List Decreased strength;Decreased mobility;Decreased range of motion;Decreased activity tolerance;Decreased balance;Decreased knowledge of use of DME;Pain;Decreased safety awareness       PT Treatment Interventions DME instruction;Therapeutic activities;Gait training;Therapeutic exercise;Patient/family education;Stair training;Balance training;Functional mobility training    PT Goals (Current goals can be found in the Care Plan section)  Acute Rehab PT Goals Patient Stated Goal: return home PT Goal Formulation: With patient/family Time For Goal Achievement: 01/10/19 Potential  to Achieve Goals: Good    Frequency Min 3X/week   Barriers to discharge        Co-evaluation               AM-PAC PT "6 Clicks" Mobility  Outcome Measure Help needed turning from your back to your side while in a flat bed without using bedrails?: A Lot Help needed moving from lying on your back to sitting on the side of a flat bed without using bedrails?: A Lot Help needed moving to and from a bed to a chair (including a wheelchair)?: Total Help needed standing up from a chair using your arms (e.g., wheelchair or bedside chair)?: Total Help needed to walk in hospital room?: Total Help needed climbing 3-5 steps with a railing? : Total 6 Click Score: 8    End of Session   Activity Tolerance: Patient limited by fatigue;Patient limited by pain Patient left: in bed;with call bell/phone within reach;with family/visitor present Nurse Communication: Mobility status PT Visit Diagnosis: Difficulty in walking, not elsewhere classified (R26.2);Muscle weakness (generalized) (M62.81);Pain Pain - Right/Left: Right Pain - part of body: Knee    Time: 1130-1209 PT Time Calculation (min) (ACUTE ONLY): 39 min   Charges:   PT Evaluation $PT Eval Moderate Complexity: 1 Mod PT Treatments $Therapeutic Exercise: 8-22 mins $Therapeutic Activity: 8-22 mins        Lelon Mast 12/27/2018, 12:33 PM

## 2018-12-27 NOTE — Progress Notes (Signed)
PROGRESS NOTE  Brianna Burns [id number] DOB: 1934/10/10 DOA: 12/24/2018 PCP: Christain Sacramento, MD  HPI/Recap of past 24 hours: This is a 84 year old female with GERD hypertension, hyperlipidemia, type 2 diabetes mellitus, TIA on Jul 05, 2018 recurrent DVT and pulmonary embolism most recent one was September 26, 2018 status post IVC filter December 19, 2018 with recent admission December 21, 2018 for displaced right patella fracture status post ORIF.  She was admitted with altered mental status found to have urinary tract infection which is most likely the cause of her altered mental status  Subjective: Patient seen and examined at bedside Awaiting PT OT to eval and treat.  Patient mentions that she feels fine and does not have any complaints.  No overnight fever chills chest pain nausea vomiting or diarrhea. Patient is adamant that she wants to go back home as she is worried about Covid infection at rehab.  Assessment/Plan: Principal Problem:   AMS (altered mental status) Active Problems:   HTN (hypertension)   Diabetes ([place])   Acute lower UTI   Hyponatremia   Anemia Plan 1.  Altered mental status likely secondary to urinary tract infection brain CT was normal. Resolved  2.  Acute urinary tract infection blood cultures are still pending she is on Rocephin we will continue Rocephin.  3.  Tachycardia with elevated troponin tachycardia is resolved continue metoprolol echocardiogram was done and is still pending.  4.  History of recurrent DVT with pulmonary embolism status post IVC filter placement continue Eliquis  Hypertension continue metoprolol and losartan  5.  Hyperlipidemia on Crestor  6.  Type 2 diabetes mellitus Metformin is on hold we will continue sliding scale insulin  7.  Anxiety continue Zoloft and imipramine  8.  Recent right patella fracture repair continue tramadol as needed for pain  Code Status: Full  Severity of Illness: The appropriate patient status for  this patient is INPATIENT. Inpatient status is judged to be reasonable and necessary in order to provide the required intensity of service to ensure the patient's safety. The patient's presenting symptoms, physical exam findings, and initial radiographic and laboratory data in the context of their chronic comorbidities is felt to place them at high risk for further clinical deterioration. Furthermore, it is not anticipated that the patient will be medically stable for discharge from the hospital within 2 midnights of admission. The following factors support the patient status of inpatient.   " The patient's presenting symptoms include altered mental status. " The worrisome physical exam findings include altered mental status. " The initial radiographic and laboratory data are worrisome because of urinary tract infection. " The chronic co-morbidities include DVT pulmonary embolism.   * I certify that at the point of admission it is my clinical judgment that the patient will require inpatient hospital care spanning beyond 2 midnights from the point of admission due to high intensity of service, high risk for further deterioration and high frequency of surveillance required.*    Family Communication: Daughter at bedside Sherri [place]  Disposition Plan: Home when stable   Consultants:  None  Procedures:  None  Antimicrobials:  Rocephin  DVT prophylaxis: Apixaban   Objective: Vitals:   12/26/18 1703 12/26/18 2024 12/27/18 0341 12/27/18 0500  BP: 131/60 (!) 103/54  127/65  Pulse:  88  90  Resp: 16 16  16   Temp:  98.4 F (36.9 C)  98.1 F (36.7 C)  TempSrc:      SpO2:  97%  94%  Weight: 77.8 kg  79.3 kg   Height: 5\' 2"  (1.575 m)       Intake/Output Summary (Last 24 hours) at 12/27/2018 1352 Last data filed at 12/27/2018 1000 Gross per 24 hour  Intake 882.92 ml  Output 1750 ml  Net -867.08 ml   Filed Weights   12/26/18 0450 12/26/18 1703 12/27/18 0341  Weight: 77.8 kg  77.8 kg 79.3 kg   Body mass index is 31.98 kg/m.  Exam:  . General: 84 y.o. year-old female well developed well nourished in no acute distress.  Alert and oriented x3. . Cardiovascular: Regular rate and rhythm with no rubs or gallops.  No thyromegaly or JVD noted.   Marland Kitchen Respiratory: Clear to auscultation with no wheezes or rales. Good inspiratory effort. . Abdomen: Soft nontender nondistended with normal bowel sounds x4 quadrants. . Musculoskeletal: No lower extremity edema. 2/4 pulses in all 4 extremities. . Skin: No ulcerative lesions noted or rashes, . 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   Data Reviewed: CBC: Recent Labs  Lab 12/21/18 1230 12/22/18 0207 12/24/18 2016 12/25/18 0353  WBC 5.7 7.7 12.4* 14.0*  NEUTROABS  --   --  10.2*  --   HGB 11.6* 10.7* 10.5* 10.5*  HCT 38.3 35.1* 33.7* 33.5*  MCV 99.0 97.2 96.3 96.3  PLT 243 231 198 [id number]   Basic Metabolic Panel: Recent Labs  Lab 12/21/18 1230 12/22/18 0207 12/24/18 2016 12/25/18 0353 12/27/18 0546  NA 139 134* 131* 132* 134*  K 4.1 4.1 3.8 3.8 4.2  CL 102 100 94* 97* 100  CO2 29 23 26 25 27   GLUCOSE 136* 170* 207* 229* 129*  BUN 23 19 19 14 20   CREATININE 1.07* 0.85 0.94 0.82 1.12*  CALCIUM 9.8 8.7* 8.9 9.0 8.9   GFR: Estimated Creatinine Clearance: 37.8 mL/min (A) (by C-G formula based on SCr of 1.12 mg/dL (H)). Liver Function Tests: Recent Labs  Lab 12/24/18 2016 12/25/18 0353  AST 18 16  ALT 12 11  ALKPHOS 48 43  BILITOT 1.0 1.1  PROT 6.6 6.3*  ALBUMIN 3.3* 3.1*   No results for input(s): LIPASE, AMYLASE in the last 168 hours. No results for input(s): AMMONIA in the last 168 hours. Coagulation Profile: Recent Labs  Lab 12/21/18 1246  INR 1.1   Cardiac Enzymes: No results for input(s): CKTOTAL, CKMB, CKMBINDEX, TROPONINI in the last 168 hours. BNP (last 3 results) No results for input(s): PROBNP in the last 8760  hours. HbA1C: No results for input(s): HGBA1C in the last 72 hours. CBG: Recent Labs  Lab 12/26/18 1155 12/26/18 1657 12/26/18 1938 12/27/18 0753 12/27/18 1106  GLUCAP 124* 114* 154* 114* 128*   Lipid Profile: No results for input(s): CHOL, HDL, LDLCALC, TRIG, CHOLHDL, LDLDIRECT in the last 72 hours. Thyroid Function Tests: Recent Labs    12/27/18 0546  TSH 2.306   Anemia Panel: Recent Labs    12/25/18 0353  VITAMINB12 182   Urine analysis:    Component Value Date/Time   COLORURINE YELLOW 12/24/2018 1855   APPEARANCEUR HAZY (A) 12/24/2018 1855   LABSPEC 1.021 12/24/2018 1855   PHURINE 5.0 12/24/2018 1855   GLUCOSEU 50 (A) 12/24/2018 1855   HGBUR NEGATIVE 12/24/2018 1855   BILIRUBINUR NEGATIVE 12/24/2018 1855   KETONESUR 5 (A) 12/24/2018 1855   PROTEINUR NEGATIVE 12/24/2018 1855   NITRITE NEGATIVE 12/24/2018 1855   LEUKOCYTESUR TRACE (A) 12/24/2018 1855   Sepsis Labs: @  LABRCNTIP(procalcitonin:4,lacticidven:4)  ) Recent Results (from the past 240 hour(s))  SARS CORONAVIRUS 2 (TAT 6-24 HRS) Nasopharyngeal Nasopharyngeal Swab     Status: None   Collection Time: 12/17/18  4:19 PM   Specimen: Nasopharyngeal Swab  Result Value Ref Range Status   SARS Coronavirus 2 NEGATIVE NEGATIVE Final    Comment: (NOTE) SARS-CoV-2 target nucleic acids are NOT DETECTED. The SARS-CoV-2 RNA is generally detectable in upper and lower respiratory specimens during the acute phase of infection. Negative results do not preclude SARS-CoV-2 infection, do not rule out co-infections with other pathogens, and should not be used as the sole basis for treatment or other patient management decisions. Negative results must be combined with clinical observations, patient history, and epidemiological information. The expected result is Negative. Fact Sheet for Patients: SugarRoll.be Fact Sheet for Healthcare  Providers: https://www.woods-mathews.com/ This test is not yet approved or cleared by the [place] FDA and  has been authorized for detection and/or diagnosis of SARS-CoV-2 by FDA under an Emergency Use Authorization (EUA). This EUA will remain  in effect (meaning this test can be used) for the duration of the COVID-19 declaration under Section 56 4(b)(1) of the Act, 21 U.S.C. section 360bbb-3(b)(1), unless the authorization is terminated or revoked sooner. Performed at [facility], [place] [address] [place], [place] [address]   SARS CORONAVIRUS 2 (TAT 6-24 HRS) Nasopharyngeal     Status: None   Collection Time: 12/24/18  6:55 PM   Specimen: Nasopharyngeal  Result Value Ref Range Status   SARS Coronavirus 2 NEGATIVE NEGATIVE Final    Comment: (NOTE) SARS-CoV-2 target nucleic acids are NOT DETECTED. The SARS-CoV-2 RNA is generally detectable in upper and lower respiratory specimens during the acute phase of infection. Negative results do not preclude SARS-CoV-2 infection, do not rule out co-infections with other pathogens, and should not be used as the sole basis for treatment or other patient management decisions. Negative results must be combined with clinical observations, patient history, and epidemiological information. The expected result is Negative. Fact Sheet for Patients: SugarRoll.be Fact Sheet for Healthcare Providers: https://www.woods-mathews.com/ This test is not yet approved or cleared by the [place] FDA and  has been authorized for detection and/or diagnosis of SARS-CoV-2 by FDA under an Emergency Use Authorization (EUA). This EUA will remain  in effect (meaning this test can be used) for the duration of the COVID-19 declaration under Section 56 4(b)(1) of the Act, 21 U.S.C. section 360bbb-3(b)(1), unless the authorization is terminated or revoked sooner. Performed at [facility],  [place] [address], [place], [place] [address]   Urine culture     Status: Abnormal   Collection Time: 12/24/18  6:55 PM   Specimen: Urine, Random  Result Value Ref Range Status   Specimen Description   Final    URINE, RANDOM Performed at [facility] [address], [place], [place] [address]    Special Requests   Final    NONE Performed at [facility], [place] [address], [place], [place] [address]    Culture >=100,000 COLONIES/mL ESCHERICHIA COLI (A)  Final   Report Status 12/27/2018 FINAL  Final   Organism ID, Bacteria ESCHERICHIA COLI (A)  Final      Susceptibility   Escherichia coli - MIC*    AMPICILLIN >=32 RESISTANT Resistant     CEFAZOLIN 8 SENSITIVE Sensitive     CEFTRIAXONE <=1 SENSITIVE Sensitive     CIPROFLOXACIN <=0.25 SENSITIVE Sensitive     GENTAMICIN <=  1 SENSITIVE Sensitive     IMIPENEM <=0.25 SENSITIVE Sensitive     NITROFURANTOIN <=16 SENSITIVE Sensitive     TRIMETH/SULFA <=20 SENSITIVE Sensitive     AMPICILLIN/SULBACTAM >=32 RESISTANT Resistant     PIP/TAZO 8 SENSITIVE Sensitive     Extended ESBL NEGATIVE Sensitive     * >=100,000 COLONIES/mL ESCHERICHIA COLI  Blood culture (routine x 2)     Status: None (Preliminary result)   Collection Time: 12/24/18  6:56 PM   Specimen: BLOOD  Result Value Ref Range Status   Specimen Description   Final    BLOOD RIGHT WRIST Performed at [facility] [address], [place], [place] [address]    Special Requests   Final    BOTTLES DRAWN AEROBIC AND ANAEROBIC Blood Culture adequate volume Performed at [facility] [address] [place], [place] [address]    Culture   Final    NO GROWTH 2 DAYS Performed at [facility] [address] [place], [place] [address]    Report Status PENDING  Incomplete  Blood culture (routine x 2)     Status: None (Preliminary result)   Collection Time: 12/24/18  7:01 PM   Specimen: BLOOD LEFT HAND  Result  Value Ref Range Status   Specimen Description   Final    BLOOD LEFT HAND Performed at [facility] [address] [place], [place] [address]    Special Requests   Final    BOTTLES DRAWN AEROBIC ONLY Blood Culture adequate volume Performed at [facility] [address] [place], [place] [address]    Culture   Final    NO GROWTH 2 DAYS Performed at [facility] [address], [place], [place] [address]    Report Status PENDING  Incomplete      Studies: No results found.  Scheduled Meds: . apixaban  5 mg Oral BID  . cefdinir  300 mg Oral Q12H  . cyanocobalamin  1,000 mcg Intramuscular Daily  . docusate sodium  100 mg Oral BID  . imipramine  25 mg Oral QHS  . insulin aspart  0-5 Units Subcutaneous QHS  . insulin aspart  0-9 Units Subcutaneous TID WC  . losartan  25 mg Oral Daily  . pantoprazole  40 mg Oral Daily  . potassium chloride  40 mEq Oral Daily  . rosuvastatin  10 mg Oral q1800  . senna  2 tablet Oral QHS  . sertraline  25 mg Oral Daily    Continuous Infusions:    LOS: 2 days     Tomiko Schoon [place]on [place], MD [facility]  To reach me or the doctor on call, go to: [email or web address] Password [email or web address]  12/27/2018, 1:52 PM

## 2018-12-27 NOTE — TOC Progression Note (Signed)
Transition of Care [facility]) - Progression Note    Patient Details  Name: Brianna Burns MRN: [id number] Date of Birth: [birth year]  Transition of Care [facility] CM/SW Contact  Deepa Barthel, Juliann Pulse, RN Phone Number: 12/27/2018, 4:00 PM  Clinical Narrative: PT recc SNF-Patient dtr in rm both agree to SNF-prefer [facility]-await bed offers.Please order covid.      Expected Discharge Plan: [place] Barriers to Discharge: Continued Medical Work up  Expected Discharge Plan and Services Expected Discharge Plan: [place]   Discharge Planning Services: CM Consult   Living arrangements for the past 2 months: Single Family Home                                       Social Determinants of Health (SDOH) Interventions    Readmission Risk Interventions No flowsheet data found.

## 2018-12-27 NOTE — NC FL2 (Signed)
[place] LEVEL OF CARE SCREENING TOOL     IDENTIFICATION  Patient Name: Brianna Burns Birthdate: Jan 18, 1935 Sex: female Admission Date (Current Location): 12/24/2018  [facility] and [place] Number:  Herbalist and Address:  [facility],  [place] [address] [place]      Provider Number: [id number]  Attending Physician Name and Address:  Vicenta Dunning, MD  Relative Name and Phone Number:  Arley Phenix [phone]    Current Level of Care: Hospital Recommended Level of Care: [place] Prior Approval Number:    Date Approved/Denied:   PASRR Number: [id number]  Discharge Plan: SNF    Current Diagnoses: Patient Active Problem List   Diagnosis Date Noted  . AMS (altered mental status) 12/24/2018  . Acute lower UTI 12/24/2018  . Hyponatremia 12/24/2018  . Anemia 12/24/2018  . Displaced fracture of right patella 12/21/2018  . HTN (hypertension) 06/27/2012  . Diabetes ([place]) 06/27/2012  . GERD (gastroesophageal reflux disease) 06/27/2012  . Nocturia 06/27/2012    Orientation RESPIRATION BLADDER Height & Weight     Self, Place, Situation  Normal Continent(currently has purwick but it will be removed @ d/c.) Weight: 79.3 kg Height:  5\' 2"  (157.5 cm)  BEHAVIORAL SYMPTOMS/MOOD NEUROLOGICAL BOWEL NUTRITION STATUS      Continent Diet(CHO MOD)  AMBULATORY STATUS COMMUNICATION OF NEEDS Skin   Limited Assist Verbally Normal                       Personal Care Assistance Level of Assistance  Bathing, Feeding, Dressing Bathing Assistance: Limited assistance Feeding assistance: Limited assistance Dressing Assistance: Limited assistance     Functional Limitations Info  Sight, Hearing, Speech Sight Info: Impaired(Legally Blind) Hearing Info: Adequate Speech Info: Adequate    SPECIAL CARE FACTORS FREQUENCY  PT (By licensed PT), OT (By licensed OT)     PT Frequency: 5x week OT Frequency: 5x week             Contractures Contractures Info: Not present    Additional Factors Info  Code Status, Allergies Code Status Info: Full code Allergies Info: ativan,baclofen,hydrocodone-acetaminophen,percocet,tylox           Current Medications (12/27/2018):  This is the current hospital active medication list Current Facility-Administered Medications  Medication Dose Route Frequency Provider Last Rate Last Dose  . acetaminophen (TYLENOL) tablet 650 mg  650 mg Oral Q6H PRN Jani Gravel, MD       Or  . acetaminophen (TYLENOL) suppository 650 mg  650 mg Rectal Q6H PRN Jani Gravel, MD      . apixaban Arne Cleveland) tablet 5 mg  5 mg Oral BID Dorrene German, RPH   5 mg at 12/27/18 1011  . cefdinir (OMNICEF) capsule 300 mg  300 mg Oral Q12H Sheth, Devam P, MD      . cyanocobalamin ((VITAMIN B-12)) injection 1,000 mcg  1,000 mcg Intramuscular Daily Sheth, Devam P, MD   1,000 mcg at 12/27/18 1013  . docusate sodium (COLACE) capsule 100 mg  100 mg Oral BID Jani Gravel, MD   100 mg at 12/27/18 1011  . hydrOXYzine (ATARAX/VISTARIL) tablet 25 mg  25 mg Oral TID PRN Cristal Deer, MD   25 mg at 12/27/18 1011  . imipramine (TOFRANIL) tablet 25 mg  25 mg Oral Loma Sousa, MD   25 mg at 12/26/18 2142  . insulin aspart (novoLOG) injection 0-5 Units  0-5 Units Subcutaneous QHS Jani Gravel,  MD   Stopped at 12/24/18 2321  . insulin aspart (novoLOG) injection 0-9 Units  0-9 Units Subcutaneous TID WC Jani Gravel, MD   1 Units at 12/26/18 1210  . losartan (COZAAR) tablet 25 mg  25 mg Oral Daily Jani Gravel, MD   25 mg at 12/27/18 1011  . ondansetron (ZOFRAN) tablet 4 mg  4 mg Oral Q6H PRN Jani Gravel, MD      . pantoprazole (PROTONIX) EC tablet 40 mg  40 mg Oral Daily Jani Gravel, MD   40 mg at 12/27/18 1012  . polyvinyl alcohol (LIQUIFILM TEARS) 1.4 % ophthalmic solution 1 drop  1 drop Left Eye TID PRN Jani Gravel, MD      . potassium chloride SA (KLOR-CON) CR tablet 40 mEq  40 mEq Oral Daily Sheth, Devam P, MD   40 mEq  at 12/27/18 1011  . rosuvastatin (CRESTOR) tablet 10 mg  10 mg Oral q1800 Jani Gravel, MD   10 mg at 12/26/18 1838  . senna (SENOKOT) tablet 17.2 mg  2 tablet Oral QHS Jani Gravel, MD   17.2 mg at 12/26/18 2142  . sertraline (ZOLOFT) tablet 25 mg  25 mg Oral Daily Jani Gravel, MD   25 mg at 12/27/18 1012  . traMADol (ULTRAM) tablet 50 mg  50 mg Oral Q6H PRN Jani Gravel, MD   50 mg at 12/27/18 1011     Discharge Medications: Please see discharge summary for a list of discharge medications.  Relevant Imaging Results:  Relevant Lab Results:   Additional Information ss#[id number] Juliann Pulse, [place]

## 2018-12-27 NOTE — Progress Notes (Addendum)
PROGRESS NOTE  Brianna Burns [id number] DOB: 1934/04/19 DOA: 12/24/2018 PCP: Christain Sacramento, MD  HPI/Recap of past 24 hours: This is a 84 year old female with GERD hypertension, hyperlipidemia, type 2 diabetes mellitus, TIA on Jul 05, 2018 recurrent DVT and pulmonary embolism most recent one was September 26, 2018 status post IVC filter December 19, 2018 with recent admission December 21, 2018 for displaced right patella fracture status post ORIF.  She was admitted with altered mental status found to have urinary tract infection which is most likely the cause of her altered mental status  Subjective: Patient seen and examined at bedside PT OT recommended short-term skilled rehab due to severe physical deconditioning of patient especially in lower extremities.    Patient mentions that she feels fine and does not have any complaints.  No overnight fever chills chest pain nausea vomiting or diarrhea. Patient is adamant that she wants to go back home as she is worried about Covid infection at rehab.  I discussed with patient's daughter who is by bedside and her son as well who is on phone.  Both the children are in agreement with patient going to rehab, however patient needs more time to think.  The other alternative would be to get assistance at home.  I will defer up to patient and family to decide.  Assessment/Plan: Principal Problem:   AMS (altered mental status) Active Problems:   HTN (hypertension)   Diabetes ([place])   Acute lower UTI   Hyponatremia   Anemia Plan 1.  Altered mental status likely secondary to e coli  urinary tract infection  brain CT was normal. Resolved  2.  Acute urinary tract infection  - switch Rocephin to cefdinir  3.  Tachycardia with elevated troponin tachycardia is resolved continue metoprolol echocardiogram was done and is still pending.  4.  History of recurrent DVT with pulmonary embolism status post IVC filter placement continue  Eliquis  Hypertension continue metoprolol and losartan  5.  Hyperlipidemia on Crestor  6.  Type 2 diabetes mellitus Metformin is on hold we will continue sliding scale insulin  7.  Anxiety continue Zoloft and imipramine  8.  Recent right patella fracture repair continue tramadol as needed for pain  Code Status: Full  Severity of Illness: The appropriate patient status for this patient is INPATIENT. Inpatient status is judged to be reasonable and necessary in order to provide the required intensity of service to ensure the patient's safety. The patient's presenting symptoms, physical exam findings, and initial radiographic and laboratory data in the context of their chronic comorbidities is felt to place them at high risk for further clinical deterioration. Furthermore, it is not anticipated that the patient will be medically stable for discharge from the hospital within 2 midnights of admission. The following factors support the patient status of inpatient.   " The patient's presenting symptoms include altered mental status. " The worrisome physical exam findings include altered mental status. " The initial radiographic and laboratory data are worrisome because of urinary tract infection. " The chronic co-morbidities include DVT pulmonary embolism.   * I certify that at the point of admission it is my clinical judgment that the patient will require inpatient hospital care spanning beyond 2 midnights from the point of admission due to high intensity of service, high risk for further deterioration and high frequency of surveillance required.*    Family Communication: Daughter at bedside Westley Chandler  Disposition Plan: Home when stable   Consultants:  None  Procedures:  None  Antimicrobials:  Rocephin  DVT prophylaxis: Apixaban   Objective: Vitals:   12/26/18 2024 12/27/18 0341 12/27/18 0500 12/27/18 1351  BP: (!) 103/54  127/65 113/75  Pulse: 88  90 91  Resp: 16  16    Temp: 98.4 F (36.9 C)  98.1 F (36.7 C) 98.9 F (37.2 C)  TempSrc:    Oral  SpO2: 97%  94% 94%  Weight:  79.3 kg    Height:        Intake/Output Summary (Last 24 hours) at 12/27/2018 1353 Last data filed at 12/27/2018 1000 Gross per 24 hour  Intake 882.92 ml  Output 1750 ml  Net -867.08 ml   Filed Weights   12/26/18 0450 12/26/18 1703 12/27/18 0341  Weight: 77.8 kg 77.8 kg 79.3 kg   Body mass index is 31.98 kg/m.  Exam:  . General: 84 y.o. year-old female well developed well nourished in no acute distress.  Alert and oriented x3. . Cardiovascular: Regular rate and rhythm with no rubs or gallops.  No thyromegaly or JVD noted.   Marland Kitchen Respiratory: Clear to auscultation with no wheezes or rales. Good inspiratory effort. . Abdomen: Soft nontender nondistended with normal bowel sounds x4 quadrants. . Musculoskeletal: No lower extremity edema. 2/4 pulses in all 4 extremities. . Skin: No ulcerative lesions noted or rashes, . 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94 0.82 1.12*  CALCIUM 9.8 8.7* 8.9 9.0 8.9   GFR: Estimated Creatinine Clearance: 37.8 mL/min (A) (by C-G formula based on SCr of 1.12 mg/dL (H)). Liver Function Tests: Recent Labs  Lab 12/24/18 2016 12/25/18 0353  AST 18 16  ALT  12 11  ALKPHOS 48 43  BILITOT 1.0 1.1  PROT 6.6 6.3*  ALBUMIN 3.3* 3.1*   No results for input(s): LIPASE, AMYLASE in the last 168 hours. No results for input(s): AMMONIA in the last 168 hours. Coagulation Profile: Recent Labs  Lab 12/21/18 1246  INR 1.1   Cardiac Enzymes: No results for input(s): CKTOTAL, CKMB, CKMBINDEX, TROPONINI in the last 168 hours. BNP (last 3 results) No results for input(s): PROBNP in the last 8760 hours. HbA1C: No results for input(s): HGBA1C in the last 72 hours. CBG: Recent Labs  Lab 12/26/18 1155 12/26/18 1657 12/26/18 1938 12/27/18 0753 12/27/18 1106  GLUCAP 124* 114* 154* 114* 128*   Lipid Profile: No results for input(s): CHOL, HDL, LDLCALC, TRIG, CHOLHDL, LDLDIRECT in the last 72 hours. Thyroid Function Tests: Recent Labs    12/27/18 0546  TSH 2.306   Anemia Panel: Recent Labs    12/25/18 0353  VITAMINB12 182   Urine analysis:  Component Value Date/Time   COLORURINE YELLOW 12/24/2018 1855   APPEARANCEUR HAZY (A) 12/24/2018 1855   LABSPEC 1.021 12/24/2018 1855   PHURINE 5.0 12/24/2018 1855   GLUCOSEU 50 (A) 12/24/2018 1855   HGBUR NEGATIVE 12/24/2018 1855   BILIRUBINUR NEGATIVE 12/24/2018 1855   KETONESUR 5 (A) 12/24/2018 1855   PROTEINUR NEGATIVE 12/24/2018 1855   NITRITE NEGATIVE 12/24/2018 1855   LEUKOCYTESUR TRACE (A) 12/24/2018 1855   Sepsis Labs: @LABRCNTIP (procalcitonin:4,lacticidven:4)  ) Recent Results (from the past 240 hour(s))  SARS CORONAVIRUS 2 (TAT 6-24 HRS) Nasopharyngeal Nasopharyngeal Swab     Status: None   Collection Time: 12/17/18  4:19 PM   Specimen: Nasopharyngeal Swab  Result Value Ref Range Status   SARS Coronavirus 2 NEGATIVE NEGATIVE Final    Comment: (NOTE) SARS-CoV-2 target nucleic acids are NOT DETECTED. The SARS-CoV-2 RNA is generally detectable in upper and lower respiratory specimens during the acute phase of infection. Negative results do not preclude SARS-CoV-2 infection, do  not rule out co-infections with other pathogens, and should not be used as the sole basis for treatment or other patient management decisions. Negative results must be combined with clinical observations, patient history, and epidemiological information. The expected result is Negative. Fact Sheet for Patients: SugarRoll.be Fact Sheet for Healthcare Providers: https://www.woods-mathews.com/ This test is not yet approved or cleared by the [place] FDA and  has been authorized for detection and/or diagnosis of SARS-CoV-2 by FDA under an Emergency Use Authorization (EUA). This EUA will remain  in effect (meaning this test can be used) for the duration of the COVID-19 declaration under Section 56 4(b)(1) of the Act, 21 U.S.C. section 360bbb-3(b)(1), unless the authorization is terminated or revoked sooner. Performed at [facility], [place] [address], [place], [place] [address]   SARS CORONAVIRUS 2 (TAT 6-24 HRS) Nasopharyngeal     Status: None   Collection Time: 12/24/18  6:55 PM   Specimen: Nasopharyngeal  Result Value Ref Range Status   SARS Coronavirus 2 NEGATIVE NEGATIVE Final    Comment: (NOTE) SARS-CoV-2 target nucleic acids are NOT DETECTED. The SARS-CoV-2 RNA is generally detectable in upper and lower respiratory specimens during the acute phase of infection. Negative results do not preclude SARS-CoV-2 infection, do not rule out co-infections with other pathogens, and should not be used as the sole basis for treatment or other patient management decisions. Negative results must be combined with clinical observations, patient history, and epidemiological information. The expected result is Negative. Fact Sheet for Patients: SugarRoll.be Fact Sheet for Healthcare Providers: https://www.woods-mathews.com/ This test is not yet approved or cleared by the [place] FDA and  has been  authorized for detection and/or diagnosis of SARS-CoV-2 by FDA under an Emergency Use Authorization (EUA). This EUA will remain  in effect (meaning this test can be used) for the duration of the COVID-19 declaration under Section 56 4(b)(1) of the Act, 21 U.S.C. section 360bbb-3(b)(1), unless the authorization is terminated or revoked sooner. Performed at [facility], [place] [address], [place], [place] [address]   Urine culture     Status: Abnormal   Collection Time: 12/24/18  6:55 PM   Specimen: Urine, Random  Result Value Ref Range Status   Specimen Description   Final    URINE, RANDOM Performed at [facility] [address] [place], [place] [address]    Special Requests   Final    NONE Performed at [facility], [place] [address], [place], [place] [address]  Culture >=100,000 COLONIES/mL ESCHERICHIA COLI (A)  Final   Report Status 12/27/2018 FINAL  Final   Organism ID, Bacteria ESCHERICHIA COLI (A)  Final      Susceptibility   Escherichia coli - MIC*    AMPICILLIN >=32 RESISTANT Resistant     CEFAZOLIN 8 SENSITIVE Sensitive     CEFTRIAXONE <=1 SENSITIVE Sensitive     CIPROFLOXACIN <=0.25 SENSITIVE Sensitive     GENTAMICIN <=1 SENSITIVE Sensitive     IMIPENEM <=0.25 SENSITIVE Sensitive     NITROFURANTOIN <=16 SENSITIVE Sensitive     TRIMETH/SULFA <=20 SENSITIVE Sensitive     AMPICILLIN/SULBACTAM >=32 RESISTANT Resistant     PIP/TAZO 8 SENSITIVE Sensitive     Extended ESBL NEGATIVE Sensitive     * >=100,000 COLONIES/mL ESCHERICHIA COLI  Blood culture (routine x 2)     Status: None (Preliminary result)   Collection Time: 12/24/18  6:56 PM   Specimen: BLOOD  Result Value Ref Range Status   Specimen Description   Final    BLOOD RIGHT WRIST Performed at [facility] [address] [place], [place] [address]    Special Requests   Final    BOTTLES DRAWN AEROBIC AND ANAEROBIC Blood Culture adequate  volume Performed at [facility] [address] [place], [place] [address]    Culture   Final    NO GROWTH 2 DAYS Performed at [facility] [address], [place], [place] [address]    Report Status PENDING  Incomplete  Blood culture (routine x 2)     Status: None (Preliminary result)   Collection Time: 12/24/18  7:01 PM   Specimen: BLOOD LEFT HAND  Result Value Ref Range Status   Specimen Description   Final    BLOOD LEFT HAND Performed at [facility] [address], [place], [place] [address]    Special Requests   Final    BOTTLES DRAWN AEROBIC ONLY Blood Culture adequate volume Performed at [facility] [address] [place], [place] [address]    Culture   Final    NO GROWTH 2 DAYS Performed at [facility] [address] [place], [place] [address]    Report Status PENDING  Incomplete      Studies: No results found.  Scheduled Meds: . apixaban  5 mg Oral BID  . cefdinir  300 mg Oral Q12H  . cyanocobalamin  1,000 mcg Intramuscular Daily  . docusate sodium  100 mg Oral BID  . imipramine  25 mg Oral QHS  . insulin aspart  0-5 Units Subcutaneous QHS  . insulin aspart  0-9 Units Subcutaneous TID WC  . losartan  25 mg Oral Daily  . pantoprazole  40 mg Oral Daily  . potassium chloride  40 mEq Oral Daily  . rosuvastatin  10 mg Oral q1800  . senna  2 tablet Oral QHS  . sertraline  25 mg Oral Daily    Continuous Infusions:    LOS: 2 days     Marvelyn Bouchillon Harmon Pier, MD [facility]  To reach me or the doctor on call, go to: [email or web address] Password [email or web address]  12/27/2018, 1:53 PM

## 2018-12-27 NOTE — TOC Progression Note (Signed)
Transition of Care [facility]) - Progression Note    Patient Details  Name: SUSSAN BOHNE MRN: [id number] Date of Birth: [birth year]  Transition of Care [facility]) CM/SW Contact  Jarae Panas, Juliann Pulse, RN Phone Number: 12/27/2018, 10:46 AM  Clinical Narrative:  Await PT recc, then CM will discuss options to family.     Expected Discharge Plan: [place] Barriers to Discharge: Continued Medical Work up  Expected Discharge Plan and Services Expected Discharge Plan: [place]   Discharge Planning Services: CM Consult   Living arrangements for the past 2 months: Single Family Home                                       Social Determinants of Health (SDOH) Interventions    Readmission Risk Interventions No flowsheet data found.

## 2018-12-28 LAB — BASIC METABOLIC PANEL
Anion gap: 10 (ref 5–15)
BUN: 21 mg/dL (ref 8–23)
CO2: 27 mmol/L (ref 22–32)
Calcium: 9.3 mg/dL (ref 8.9–10.3)
Chloride: 99 mmol/L (ref 98–111)
Creatinine, Ser: 0.97 mg/dL (ref 0.44–1.00)
GFR calc Af Amer: >60 mL/min (ref >60)
GFR calc non Af Amer: 54 mL/min — ABNORMAL LOW (ref >60)
Glucose, Bld: 151 mg/dL — ABNORMAL HIGH (ref 70–99)
Potassium: 4.3 mmol/L (ref 3.5–5.1)
Sodium: 136 mmol/L (ref 135–145)

## 2018-12-28 LAB — GLUCOSE, CAPILLARY
Glucose-Capillary: 121 mg/dL — ABNORMAL HIGH (ref 70–99)
Glucose-Capillary: 112 mg/dL — ABNORMAL HIGH (ref 70–99)
Glucose-Capillary: 148 mg/dL — ABNORMAL HIGH (ref 70–99)
Glucose-Capillary: 92 mg/dL (ref 70–99)

## 2018-12-28 LAB — SARS CORONAVIRUS 2 (TAT 6-24 HRS): SARS Coronavirus 2: NEGATIVE

## 2018-12-28 NOTE — TOC Progression Note (Signed)
Transition of Care [facility]) - Progression Note    Patient Details  Name: Brianna Burns MRN: [id number] Date of Birth: Jun 30, 1934  Transition of Care [facility]) CM/SW Contact  Sergio Hobart, Juliann Pulse, RN Phone Number: 12/28/2018, 12:51 PM  Clinical Narrative: Provided bed offers to dtr  Arley Phenix in rm-she wants [facility]-but currently they do not have a bed available the earliest is Friday & they cannot guarantee-dtr will f/u with [facility]-also informed dtr of medicare appeal rights @ d/c-dtr voiced understanding.      Expected Discharge Plan: [place] Barriers to Discharge: Continued Medical Work up  Expected Discharge Plan and Services Expected Discharge Plan: [place]   Discharge Planning Services: CM Consult   Living arrangements for the past 2 months: Single Family Home                                       Social Determinants of Health (SDOH) Interventions    Readmission Risk Interventions No flowsheet data found.

## 2018-12-28 NOTE — Care Management Important Message (Signed)
Important Message  Patient Details IM Letter given to Dessa Phi RN to present to the Patient Name: Brianna Burns MRN: [id number] Date of Birth: [birth year]   Medicare Important Message Given:  Yes     Kerin Salen 12/28/2018, 1:14 PM
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
## 2018-12-28 NOTE — TOC Progression Note (Signed)
Transition of Care [facility]) - Progression Note    Patient Details  Name: Brianna Burns MRN: [id number] Date of Birth: 1934/07/25  Transition of Care [facility]) CM/SW Contact  Raeana Blinn, Juliann Pulse, RN Phone Number: 12/28/2018, 1:11 PM  Clinical Narrative:    12/28/2018 Medicare Nursing Home Results [email or web address] 1/3 Close window 11 hospitals within 25 miles from the center of [place], [place]. Nursing Home Search Results Results List Table Nursing Home Information Overall Rating Health Inpections Staffing Quality Ratings [facility] [place] [address] [place], [place] [address] [phone] Much Below Average Much Below Average Not Available12 Above Average [place], [place] [id number] [phone] Below Average Below Average Average Above Average [facility] [place], [place] [id number] [phone] Much Below Average Much Below Average Below Average Below Average [place], [place] [address] [phone] Much Below Average Much Below Average Below Average Average 1 out of 5 stars 1 out of 5 stars 4 out of 5 stars 2 out of 5 stars 2 out of 5 stars 3 out of 5 stars 4 out of 5 stars 1 out of 5 stars 1 out of 5 stars 2 out of 5 stars 2 out of 5 stars 1 out of 5 stars 1 out of 5 stars 2 out of 5 stars 3 out of 5 stars 12/28/2018 Medicare Nursing Home Results [email or web address] 2/3 Nursing Home Information Overall Rating Health Inpections Staffing Quality Ratings THE [facility] & RETIREMENT [address] [place], [place] [id number] [phone] Below Average Below Average Below Average  Below Average [place], [place] [address] [phone] Much Below Average Much Below Average Below Average Average [place], [place] [address] [phone] Above Average Above Average Below Average Above Average [facility] [place], [place] [address] [phone] Much Above Average Much Above Average Above Average Above [place] [address] [place], [place] [address] [phone] Much Below Average Much Below Average Much Below Average Below Average [facility] [address] [place], [place] [address] [phone] Much Above Average Above Average Above Average Much Above Average [facility] [place], [place] [address] [phone] Below Average Below Average Average Above Average NursingHome[facility] Footnotes 2 out of 5 stars 2 out of 5 stars 2 out of 5 stars 2 out of 5 stars 1 out of 5 stars 1 out of 5 stars 2 out of 5 stars 3 out of 5 stars 4 out of 5 stars 4 out of 5 stars 2 out of 5 stars 4 out of 5 stars 5 out of 5 stars 5 out of 5 stars 4 out of 5 stars 4 out of 5 stars 1 out of 5 stars 1 out of 5 stars 1 out of 5 stars 2 out of 5 stars 5 out of 5 stars 4 out of 5 stars 4 out of 5 stars 5 out of 5 stars 2 out of 5 stars 2 out of 5 stars 3 out of 5 stars 4 out of 5 stars 12/28/2018 Medicare Nursing Home Results [email or web address] 3/3 Footnote number Footnote as displayed on nursing home Compare Footnote number Footnote as displayed on nursing home Compare 1 Newly certified nursing home with less than 12-15 months of data available or the nursing  opened less than 6 months ago, and there were no data to submit or claims for this measure. 2 Not  enough data available to calculate a star rating. 6 This facility did not submit staffing data, or submitted data that did not meet the criteria required to calculate a staffing measure. 7 CMS determined that the percentage was not accurate or data suppressed by CMS for one or more quarters. 9 The number of residents or resident stays is too small to report. Call the facility to discuss this quality measure. 10 The data for this measure is missing or was not submitted. Call the facility to discuss this quality measure. 12 This facility either did not submit staffing data, has reported a high number of days without a registered nurse onsite, or submitted data that could not be verified through an audit. 13 Results are based on a shorter time period than required. 14 This nursing home is not required to submit data for the [place] Reporting Program. 18 This facility is not rated due to a history of serious quality issues and is included in the special focus facility program.   Expected Discharge Plan: Skilled Nursing Facility Barriers to Discharge: Continued Medical Work up  Expected Discharge Plan and Services Expected Discharge Plan: [place]   Discharge Planning Services: CM Consult   Living arrangements for the past 2 months: Single Family Home                                       Social Determinants of Health (SDOH) Interventions    Readmission Risk Interventions No flowsheet data found.

## 2018-12-28 NOTE — Progress Notes (Signed)
Physical Therapy Treatment Patient Details Name: Brianna Burns MRN: [id number] DOB: 1934/10/19 Today's Date: 12/28/2018    History of Present Illness 84 year old female was admitted with AMS, uti.   Admitted last week s/p ORIF R patella (11/11), also s/p IV filter. PMH:  arthritis, back sx    PT Comments    POD # 7 Pt AxO x 1 following repeat functional commands.  Daughter at bedside and reported pt's cognition is impaired die to UTI.  Assisted to EOB.  General bed mobility comments: + 2 assist using bed pad to complete scooting to EOB.  Once upright, pt required Min Assist to static sit EOB x 6 min before attempting sit to stand. Assisted OOB.  General transfer comment: pt able to assit with sit to partial stand off elevated bed and + 2 side by side assist while wearing KI.  Then required increased assist to complete 1/4 pivot few shuffled steps to recliner.  Required assist to support R LE with stand to sit to control decend and support R LE.  Pt was unable to take functional steps.  Positioned in recliner.   Rec Maxi Move back to bed.  Follow Up Recommendations  SNF     Equipment Recommendations  None recommended by PT    Recommendations for Other Services       Precautions / Restrictions Precautions Precautions: Fall Precaution Comments: no knee ROM Required Braces or Orthoses: Knee Immobilizer - Right Knee Immobilizer - Right: On at all times Restrictions Weight Bearing Restrictions: No Other Position/Activity Restrictions: WBAT    Mobility  Bed Mobility Overal bed mobility: Needs Assistance Bed Mobility: Rolling;Supine to Sit Rolling: Mod assist;+2 for physical assistance;+2 for safety/equipment   Supine to sit: Max assist;Total assist     General bed mobility comments: + 2 assist using bed pad to complete scooting to EOB.  Once upright, pt required Min Assist to static sir EOB x 6 min before attempting sit to stand.  Transfers Overall transfer level: Needs  assistance Equipment used: Rolling walker (2 wheeled) Transfers: Sit to/from [facility] Sit to Stand: +2 physical assistance;Max assist Stand pivot transfers: +2 physical assistance;Max assist;Total assist       General transfer comment: pt able to assit with sit to partial stand off elevated bed and + 2 side by side assist while wearing KI.  Then required increased assist to complete 1/4 pivot few shuffled steps to recliner.  Required assist to support R LE with stand to sit to control decend and support R LE.  Ambulation/Gait                 Stairs             Wheelchair Mobility    Modified Rankin (Stroke Patients Only)       Balance                                            Cognition Arousal/Alertness: Awake/alert   Overall Cognitive Status: Impaired/Different from baseline Area of Impairment: Problem solving;Following commands;Safety/judgement;Awareness                       Following Commands: Follows one step commands inconsistently;Follows one step commands with increased time Safety/Judgement: Decreased awareness of safety;Decreased awareness of deficits Awareness: Intellectual Problem Solving: Slow processing;Requires verbal cues;Requires tactile cues;Difficulty sequencing General  Comments: following repeat functional commands      Exercises      General Comments        Pertinent Vitals/Pain Pain Assessment: Faces Faces Pain Scale: Hurts little more Pain Location: R knee with movement Pain Descriptors / Indicators: Grimacing Pain Intervention(s): Monitored during session;Repositioned    Home Living                      Prior Function            PT Goals (current goals can now be found in the care plan section) Progress towards PT goals: Progressing toward goals    Frequency    Min 3X/week      PT Plan Current plan remains appropriate    Co-evaluation               AM-PAC PT "6 Clicks" Mobility   Outcome Measure  Help needed turning from your back to your side while in a flat bed without using bedrails?: A Lot Help needed moving from lying on your back to sitting on the side of a flat bed without using bedrails?: A Lot Help needed moving to and from a bed to a chair (including a wheelchair)?: Total Help needed standing up from a chair using your arms (e.g., wheelchair or bedside chair)?: Total Help needed to walk in hospital room?: Total Help needed climbing 3-5 steps with a railing? : Total 6 Click Score: 8    End of Session Equipment Utilized During Treatment: Gait belt Activity Tolerance: Treatment limited secondary to medical complications (Comment);Other (comment)(slow, sluggish, groggy) Patient left: in chair Nurse Communication: Mobility status;Need for lift equipment PT Visit Diagnosis: Difficulty in walking, not elsewhere classified (R26.2);Muscle weakness (generalized) (M62.81);Pain Pain - Right/Left: Right Pain - part of body: Knee     Time: [id number] PT Time Calculation (min) (ACUTE ONLY): 41 min  Charges:  $Therapeutic Activity: 38-52 mins                     Rica Koyanagi  PTA Acute  Rehabilitation Services Pager      [phone] Office      [phone]

## 2018-12-28 NOTE — TOC Progression Note (Signed)
Transition of Care [facility]) - Progression Note    Patient Details  Name: Brianna Burns MRN: [id number] Date of Birth: 02-01-1935  Transition of Care [facility]) CM/SW Contact  Jette Lewan, Juliann Pulse, RN Phone Number: 12/28/2018, 1:39 PM  Clinical Narrative: [facility] has accepted-rep Elyse [facility] following-will start auth-plan for d/c Friday. COVID ordered.      Expected Discharge Plan: Skilled Nursing Facility Barriers to Discharge: Insurance Authorization  Expected Discharge Plan and Services Expected Discharge Plan: [place]   Discharge Planning Services: CM Consult   Living arrangements for the past 2 months: Single Family Home                                       Social Determinants of Health (SDOH) Interventions    Readmission Risk Interventions No flowsheet data found.

## 2018-12-28 NOTE — Progress Notes (Addendum)
PROGRESS NOTE  Brianna Burns [id number] DOB: July 15, 1934 DOA: 12/24/2018 PCP: Christain Sacramento, MD  HPI/Recap of past 24 hours: This is a 84 year old female with GERD hypertension, hyperlipidemia, type 2 diabetes mellitus, TIA on Jul 05, 2018 recurrent DVT and pulmonary embolism most recent one was September 26, 2018 status post IVC filter December 19, 2018 with recent admission December 21, 2018 for displaced right patella fracture status post ORIF.  She was admitted with altered mental status found to have urinary tract infection which is most likely the cause of her altered mental status  Subjective:    Patient seen and examined at bedside PT OT recommended short-term skilled rehab due to severe physical deconditioning of patient especially in lower extremities.    Patient mentions that she feels fine and does not have any complaints.  No overnight fever chills chest pain nausea vomiting or diarrhea. Patient is adamant that she wants to go back home as she is worried about Covid infection at rehab.  I discussed with patient's daughter who is by bedside and her son as well who is on phone.  Both the children are in agreement with patient going to rehab, Case manager working to find rehab place for her.  Covid test pending   Assessment/Plan: Principal Problem:   AMS (altered mental status) Active Problems:   HTN (hypertension)   Diabetes ([place])   Acute lower UTI   Hyponatremia   Anemia Plan 1.  Altered mental status /metabolic encephalopathy likely secondary to e coli  urinary tract infection  brain CT was normal. Resolved  2.  Acute urinary tract infection  - switch Rocephin to cefdinir  3.  Tachycardia with elevated troponin tachycardia is resolved continue metoprolol echocardiogram was done and is still pending.  4.  History of recurrent DVT with pulmonary embolism status post IVC filter placement continue Eliquis  Hypertension continue metoprolol and losartan  5.   Hyperlipidemia on Crestor  6.  Type 2 diabetes mellitus Metformin is on hold we will continue sliding scale insulin  7.  Anxiety continue Zoloft and imipramine  8.  Recent right patella fracture repair continue tramadol as needed for pain  Code Status: Full  Severity of Illness: The appropriate patient status for this patient is INPATIENT. Inpatient status is judged to be reasonable and necessary in order to provide the required intensity of service to ensure the patient's safety. The patient's presenting symptoms, physical exam findings, and initial radiographic and laboratory data in the context of their chronic comorbidities is felt to place them at high risk for further clinical deterioration. Furthermore, it is not anticipated that the patient will be medically stable for discharge from the hospital within 2 midnights of admission. The following factors support the patient status of inpatient.   " The patient's presenting symptoms include altered mental status. " The worrisome physical exam findings include altered mental status. " The initial radiographic and laboratory data are worrisome because of urinary tract infection. " The chronic co-morbidities include DVT pulmonary embolism.   * I certify that at the point of admission it is my clinical judgment that the patient will require inpatient hospital care spanning beyond 2 midnights from the point of admission due to high intensity of service, high risk for further deterioration and high frequency of surveillance required.*    Family Communication: Daughter at bedside Sherri [place]  Disposition Plan: Home when stable   Consultants: None  Procedures: None  Antimicrobials: Rocephin  DVT prophylaxis: Apixaban  Objective: Vitals:   12/27/18 2009 12/28/18 0438 12/28/18 0500 12/28/18 1414  BP: 127/61 (!) 125/57  122/68  Pulse: 95 100  84  Resp: 20 20  15   Temp: 97.7 F (36.5 C) 98.5 F (36.9 C)  98.4 F (36.9 C)   TempSrc: Oral Oral  Oral  SpO2: 92% 95%  92%  Weight:   76.5 kg   Height:        Intake/Output Summary (Last 24 hours) at 12/28/2018 1707 Last data filed at 12/28/2018 0443 Gross per 24 hour  Intake --  Output 700 ml  Net -700 ml   Filed Weights   12/26/18 1703 12/27/18 0341 12/28/18 0500  Weight: 77.8 kg 79.3 kg 76.5 kg   Body mass index is 30.85 kg/m.  Exam:  General: 84 y.o. year-old female well developed well nourished in no acute distress.  Alert and oriented x3. Cardiovascular: Regular rate and rhythm with no rubs or gallops.  No thyromegaly or JVD noted.   Respiratory: Clear to auscultation with no wheezes or rales. Good inspiratory effort. Abdomen: Soft nontender nondistended with normal bowel sounds x4 quadrants. Musculoskeletal: No lower extremity edema. 2/4 pulses in all 4 extremities. 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 Lower extremity weakness present strength 3 out of 5   Data Reviewed: CBC: Recent Labs  Lab 12/22/18 0207 12/24/18 2016 12/25/18 0353  WBC 7.7 12.4* 14.0*  NEUTROABS  --  10.2*  --   HGB 10.7* 10.5* 10.5*  HCT 35.1* 33.7* 33.5*  MCV 97.2 96.3 96.3  PLT 231 198 [id number]   Basic Metabolic Panel: Recent Labs  Lab 12/22/18 0207 12/24/18 2016 12/25/18 0353 12/27/18 0546 12/28/18 0502  NA 134* 131* 132* 134* 136  K 4.1 3.8 3.8 4.2 4.3  CL 100 94* 97* 100 99  CO2 23 26 25 27 27   GLUCOSE 170* 207* 229* 129* 151*  BUN 19 19 14 20 21   CREATININE 0.85 0.94 0.82 1.12* 0.97  CALCIUM 8.7* 8.9 9.0 8.9 9.3   GFR: Estimated Creatinine Clearance: 42.8 mL/min (by C-G formula based on SCr of 0.97 mg/dL). Liver Function Tests: Recent Labs  Lab 12/24/18 2016 12/25/18 0353  AST 18 16  ALT 12 11  ALKPHOS 48 43  BILITOT 1.0 1.1  PROT 6.6 6.3*  ALBUMIN 3.3* 3.1*   No results for input(s): LIPASE, AMYLASE in the last  168 hours. No results for input(s): AMMONIA in the last 168 hours. Coagulation Profile: No results for input(s): INR, PROTIME in the last 168 hours. Cardiac Enzymes: No results for input(s): CKTOTAL, CKMB, CKMBINDEX, TROPONINI in the last 168 hours. BNP (last 3 results) No results for input(s): PROBNP in the last 8760 hours. HbA1C: No results for input(s): HGBA1C in the last 72 hours. CBG: Recent Labs  Lab 12/27/18 1630 12/27/18 1956 12/28/18 0726 12/28/18 1202 12/28/18 1646  GLUCAP 107* 147* 121* 112* 148*   Lipid Profile: No results for input(s): CHOL, HDL, LDLCALC, TRIG, CHOLHDL, LDLDIRECT in the last 72 hours. Thyroid Function Tests: Recent Labs    12/27/18 0546  TSH 2.306   Anemia Panel: No results for input(s): VITAMINB12, FOLATE, FERRITIN, TIBC, IRON, RETICCTPCT in the last 72 hours. Urine analysis:    Component Value Date/Time   COLORURINE YELLOW 12/24/2018 1855   APPEARANCEUR HAZY (A) 12/24/2018 1855   LABSPEC 1.021 12/24/2018 1855   PHURINE 5.0 12/24/2018 1855  GLUCOSEU 50 (A) 12/24/2018 1855   HGBUR NEGATIVE 12/24/2018 1855   BILIRUBINUR NEGATIVE 12/24/2018 1855   KETONESUR 5 (A) 12/24/2018 1855   PROTEINUR NEGATIVE 12/24/2018 1855   NITRITE NEGATIVE 12/24/2018 1855   LEUKOCYTESUR TRACE (A) 12/24/2018 1855   Sepsis Labs: @LABRCNTIP (procalcitonin:4,lacticidven:4)  ) Recent Results (from the past 240 hour(s))  SARS CORONAVIRUS 2 (TAT 6-24 HRS) Nasopharyngeal     Status: None   Collection Time: 12/24/18  6:55 PM   Specimen: Nasopharyngeal  Result Value Ref Range Status   SARS Coronavirus 2 NEGATIVE NEGATIVE Final    Comment: (NOTE) SARS-CoV-2 target nucleic acids are NOT DETECTED. The SARS-CoV-2 RNA is generally detectable in upper and lower respiratory specimens during the acute phase of infection. Negative results do not preclude SARS-CoV-2 infection, do not rule out co-infections with other pathogens, and should not be used as the sole basis  for treatment or other patient management decisions. Negative results must be combined with clinical observations, patient history, and epidemiological information. The expected result is Negative. Fact Sheet for Patients: SugarRoll.be Fact Sheet for Healthcare Providers: https://www.woods-mathews.com/ This test is not yet approved or cleared by the [place] FDA and  has been authorized for detection and/or diagnosis of SARS-CoV-2 by FDA under an Emergency Use Authorization (EUA). This EUA will remain  in effect (meaning this test can be used) for the duration of the COVID-19 declaration under Section 56 4(b)(1) of the Act, 21 U.S.C. section 360bbb-3(b)(1), unless the authorization is terminated or revoked sooner. Performed at [facility], [place] [address], [place], [place] [address]   Urine culture     Status: Abnormal   Collection Time: 12/24/18  6:55 PM   Specimen: Urine, Random  Result Value Ref Range Status   Specimen Description   Final    URINE, RANDOM Performed at [facility] [address], [place], [place] [address]    Special Requests   Final    NONE Performed at [facility], [place] [address], [place], [place] [address]    Culture >=100,000 COLONIES/mL ESCHERICHIA COLI (A)  Final   Report Status 12/27/2018 FINAL  Final   Organism ID, Bacteria ESCHERICHIA COLI (A)  Final      Susceptibility   Escherichia coli - MIC*    AMPICILLIN >=32 RESISTANT Resistant     CEFAZOLIN 8 SENSITIVE Sensitive     CEFTRIAXONE <=1 SENSITIVE Sensitive     CIPROFLOXACIN <=0.25 SENSITIVE Sensitive     GENTAMICIN <=1 SENSITIVE Sensitive     IMIPENEM <=0.25 SENSITIVE Sensitive     NITROFURANTOIN <=16 SENSITIVE Sensitive     TRIMETH/SULFA <=20 SENSITIVE Sensitive     AMPICILLIN/SULBACTAM >=32 RESISTANT Resistant     PIP/TAZO 8 SENSITIVE Sensitive     Extended ESBL NEGATIVE Sensitive     * >=100,000  COLONIES/mL ESCHERICHIA COLI  Blood culture (routine x 2)     Status: None (Preliminary result)   Collection Time: 12/24/18  6:56 PM   Specimen: BLOOD  Result Value Ref Range Status   Specimen Description   Final    BLOOD RIGHT WRIST Performed at [facility] [address] [place], [place] [address]    Special Requests   Final    BOTTLES DRAWN AEROBIC AND ANAEROBIC Blood Culture adequate volume Performed at [facility] [address], [place], [place] [address]    Culture   Final    NO GROWTH 3 DAYS Performed at [facility],  [address], [place], [place] [address]    Report Status PENDING  Incomplete  Blood culture (routine x 2)     Status: None (Preliminary result)   Collection Time: 12/24/18  7:01 PM   Specimen: BLOOD LEFT HAND  Result Value Ref Range Status   Specimen Description   Final    BLOOD LEFT HAND Performed at [facility] [address], [place], [place] [address]    Special Requests   Final    BOTTLES DRAWN AEROBIC ONLY Blood Culture adequate volume Performed at [facility] [address], [place], [place] [address]    Culture   Final    NO GROWTH 3 DAYS Performed at [facility], [place] [address], [place], [place] [address]    Report Status PENDING  Incomplete      Studies: No results found.  Scheduled Meds:  apixaban  5 mg Oral BID   cefdinir  300 mg Oral Q12H   cyanocobalamin  1,000 mcg Intramuscular Daily   docusate sodium  100 mg Oral BID   imipramine  25 mg Oral QHS   insulin aspart  0-5 Units Subcutaneous QHS   insulin aspart  0-9 Units Subcutaneous TID WC   losartan  25 mg Oral Daily   pantoprazole  40 mg Oral Daily   rosuvastatin  10 mg Oral q1800   senna  2 tablet Oral QHS   sertraline  25 mg Oral Daily    Continuous Infusions:     LOS: 3 days     Kelsha Older [place], MD [facility]  To reach me or the doctor on call, go  to: [email or web address] Password [email or web address]  12/28/2018, 5:07 PM

## 2018-12-29 DIAGNOSIS — E1165 Type 2 diabetes mellitus with hyperglycemia: Secondary | ICD-10-CM

## 2018-12-29 DIAGNOSIS — R5381 Other malaise: Secondary | ICD-10-CM | POA: Diagnosis present

## 2018-12-29 DIAGNOSIS — B9620 Unspecified Escherichia coli [E. coli] as the cause of diseases classified elsewhere: Secondary | ICD-10-CM

## 2018-12-29 DIAGNOSIS — D638 Anemia in other chronic diseases classified elsewhere: Secondary | ICD-10-CM | POA: Diagnosis not present

## 2018-12-29 DIAGNOSIS — S82001S Unspecified fracture of right patella, sequela: Secondary | ICD-10-CM | POA: Diagnosis not present

## 2018-12-29 DIAGNOSIS — Z86718 Personal history of other venous thrombosis and embolism: Secondary | ICD-10-CM

## 2018-12-29 DIAGNOSIS — I10 Essential (primary) hypertension: Secondary | ICD-10-CM

## 2018-12-29 DIAGNOSIS — R404 Transient alteration of awareness: Secondary | ICD-10-CM | POA: Diagnosis not present

## 2018-12-29 DIAGNOSIS — Z95828 Presence of other vascular implants and grafts: Secondary | ICD-10-CM

## 2018-12-29 DIAGNOSIS — N390 Urinary tract infection, site not specified: Principal | ICD-10-CM

## 2018-12-29 DIAGNOSIS — I2699 Other pulmonary embolism without acute cor pulmonale: Secondary | ICD-10-CM | POA: Diagnosis present

## 2018-12-29 LAB — CBC
HCT: 32.2 % — ABNORMAL LOW (ref 36.0–46.0)
Hemoglobin: 9.7 g/dL — ABNORMAL LOW (ref 12.0–15.0)
MCH: 29.8 pg (ref 26.0–34.0)
MCHC: 30.1 g/dL (ref 30.0–36.0)
MCV: 98.8 fL (ref 80.0–100.0)
Platelets: 262 10*3/uL (ref 150–400)
RBC: 3.26 MIL/uL — ABNORMAL LOW (ref 3.87–5.11)
RDW: 12.6 % (ref 11.5–15.5)
WBC: 4.3 10*3/uL (ref 4.0–10.5)
nRBC: 0 % (ref 0.0–0.2)

## 2018-12-29 LAB — BASIC METABOLIC PANEL
Anion gap: 13 (ref 5–15)
BUN: 14 mg/dL (ref 8–23)
CO2: 24 mmol/L (ref 22–32)
Calcium: 9.2 mg/dL (ref 8.9–10.3)
Chloride: 98 mmol/L (ref 98–111)
Creatinine, Ser: 0.91 mg/dL (ref 0.44–1.00)
GFR calc Af Amer: >60 mL/min (ref >60)
GFR calc non Af Amer: 59 mL/min — ABNORMAL LOW (ref >60)
Glucose, Bld: 104 mg/dL — ABNORMAL HIGH (ref 70–99)
Potassium: 3.9 mmol/L (ref 3.5–5.1)
Sodium: 135 mmol/L (ref 135–145)

## 2018-12-29 LAB — GLUCOSE, CAPILLARY
Glucose-Capillary: 103 mg/dL — ABNORMAL HIGH (ref 70–99)
Glucose-Capillary: 112 mg/dL — ABNORMAL HIGH (ref 70–99)
Glucose-Capillary: 115 mg/dL — ABNORMAL HIGH (ref 70–99)
Glucose-Capillary: 164 mg/dL — ABNORMAL HIGH (ref 70–99)

## 2018-12-29 NOTE — Progress Notes (Signed)
PROGRESS NOTE  Brianna Burns [id number] DOB: 06/27/1934 DOA: 12/24/2018 PCP: Christain Sacramento, MD  HPI/Recap of past 24 hours: This is a 84 year old female with GERD hypertension, hyperlipidemia, type 2 diabetes mellitus, TIA on Jul 05, 2018 recurrent DVT and pulmonary embolism most recent one was September 26, 2018 status post IVC filter December 19, 2018 with recent admission December 21, 2018 for displaced right patella fracture status post ORIF.  She was admitted with altered mental status likely due to E coli UTI. On Omnicef. Await for rehab due to physical deconditioning, likely tomorrow.   Subjective:    Patient seen and examined at bedside   Patient stated she has pain in LE and back but tramadol helps and eased it away.  No overnight fever chills chest pain nausea vomiting or diarrhea. She has BM. Her appetite is OK.  Covid test negative.    Assessment/Plan: Principal Problem:   AMS (altered mental status) Active Problems:   HTN (hypertension)   DM (diabetes mellitus), type 2 ([place]), NIDDM   Displaced fracture of right patella   E. coli UTI (urinary tract infection)   Anemia, chronic disease   History of recurrent deep vein thrombosis (DVT)   Recurrent pulmonary embolism (HCC)   S/P IVC filter   Physical deconditioning Plan 1.  Altered mental status /metabolic encephalopathy likely secondary to e coli  urinary tract infection  brain CT was normal. Mental status improving.   2.  Acute urinary tract infection due to E coli infection - switch Rocephin to cefdinir to prepare for discharge. Total 7 day treatment  3.  tachycardia is resolved, continue metoprolol, mild but flat elevation of troponin is likely from demand ischemia, pt has no cardiac symptoms, ECHO is grossly normal.  4.  History of recurrent DVT with pulmonary embolism status post IVC filter placement continue Eliquis  5. Hypertension continue metoprolol and losartan  6.  Type 2 diabetes mellitus Metformin  is on hold we will continue sliding scale insulin  7.  Anxiety continue Zoloft and imipramine  8.  Recent right patella fracture repair continue tramadol as needed for pain. Pt is physically deconditioned and pending rehab tomorrow   Code Status: Full  Severity of Illness: The appropriate patient status for this patient is INPATIENT. Inpatient status is judged to be reasonable and necessary in order to provide the required intensity of service to ensure the patient's safety. The patient's presenting symptoms, physical exam findings, and initial radiographic and laboratory data in the context of their chronic comorbidities is felt to place them at high risk for further clinical deterioration. Furthermore, it is not anticipated that the patient will be medically stable for discharge from the hospital within 2 midnights of admission. The following factors support the patient status of inpatient.   " The patient's presenting symptoms include altered mental status. " The worrisome physical exam findings include altered mental status. " The initial radiographic and laboratory data are worrisome because of urinary tract infection. " The chronic co-morbidities include DVT pulmonary embolism.   * I certify that at the point of admission it is my clinical judgment that the patient will require inpatient hospital care spanning beyond 2 midnights from the point of admission due to high intensity of service, high risk for further deterioration and high frequency of surveillance required.*    Family Communication: Daughter   Disposition Plan: rehab tomorrow   Consultants:  None  Procedures:  None  Antimicrobials:  Omnicef  DVT prophylaxis: Apixaban  Objective: Vitals:   12/28/18 0500 12/28/18 1414 12/28/18 2018 12/29/18 0525  BP:  122/68 (!) 141/89 124/60  Pulse:  84 86 72  Resp:  15 18 18   Temp:  98.4 F (36.9 C) 98.2 F (36.8 C) 97.9 F (36.6 C)  TempSrc:  Oral Oral Oral   SpO2:  92% 98% 93%  Weight: 76.5 kg   75.4 kg  Height:        Intake/Output Summary (Last 24 hours) at 12/29/2018 0917 Last data filed at 12/29/2018 0648 Gross per 24 hour  Intake -  Output 1250 ml  Net -1250 ml   Filed Weights   12/27/18 0341 12/28/18 0500 12/29/18 0525  Weight: 79.3 kg 76.5 kg 75.4 kg   Body mass index is 30.4 kg/m.  Exam:  . General: 84 y.o. year-old female well developed well nourished in no acute distress.  Alert and oriented x2 (not to place). . Cardiovascular: Regular rate and rhythm with no rubs or gallops.  No thyromegaly or JVD noted.   Marland Kitchen Respiratory: Clear to auscultation with no wheezes or rales. Good inspiratory effort. . Abdomen: Soft nontender nondistended with normal bowel sounds x4 quadrants. . Musculoskeletal: No lower extremity edema. 2/4 pulses in all 4 extremities. . Skin: No ulcerative lesions noted or rashes, . 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 Lower extremity weakness present strength 3 out of 5   Data Reviewed: CBC: Recent Labs  Lab 12/24/18 2016 12/25/18 0353 12/29/18 0515  WBC 12.4* 14.0* 4.3  NEUTROABS 10.2*  --   --   HGB 10.5* 10.5* 9.7*  HCT 33.7* 33.5* 32.2*  MCV 96.3 96.3 98.8  PLT 198 203 [id number]   Basic Metabolic Panel: Recent Labs  Lab 12/24/18 2016 12/25/18 0353 12/27/18 0546 12/28/18 0502 12/29/18 0515  NA 131* 132* 134* 136 135  K 3.8 3.8 4.2 4.3 3.9  CL 94* 97* 100 99 98  CO2 26 25 27 27 24   GLUCOSE 207* 229* 129* 151* 104*  BUN 19 14 20 21 14   CREATININE 0.94 0.82 1.12* 0.97 0.91  CALCIUM 8.9 9.0 8.9 9.3 9.2   GFR: Estimated Creatinine Clearance: 45.3 mL/min (by C-G formula based on SCr of 0.91 mg/dL). Liver Function Tests: Recent Labs  Lab 12/24/18 2016 12/25/18 0353  AST 18 16  ALT 12 11  ALKPHOS 48 43  BILITOT 1.0 1.1  PROT 6.6 6.3*  ALBUMIN 3.3* 3.1*   No results for input(s): LIPASE, AMYLASE in the  last 168 hours. No results for input(s): AMMONIA in the last 168 hours. Coagulation Profile: No results for input(s): INR, PROTIME in the last 168 hours. Cardiac Enzymes: No results for input(s): CKTOTAL, CKMB, CKMBINDEX, TROPONINI in the last 168 hours. BNP (last 3 results) No results for input(s): PROBNP in the last 8760 hours. HbA1C: No results for input(s): HGBA1C in the last 72 hours. CBG: Recent Labs  Lab 12/28/18 0726 12/28/18 1202 12/28/18 1646 12/28/18 2022 12/29/18 0739  GLUCAP 121* 112* 148* 92 103*   Lipid Profile: No results for input(s): CHOL, HDL, LDLCALC, TRIG, CHOLHDL, LDLDIRECT in the last 72 hours. Thyroid Function Tests: Recent Labs    12/27/18 0546  TSH 2.306   Anemia Panel: No results for input(s): VITAMINB12, FOLATE, FERRITIN, TIBC, IRON, RETICCTPCT in the last 72 hours. Urine analysis:    Component Value Date/Time   COLORURINE YELLOW 12/24/2018 1855   APPEARANCEUR HAZY (A) 12/24/2018 1855   LABSPEC  1.021 12/24/2018 1855   PHURINE 5.0 12/24/2018 1855   GLUCOSEU 50 (A) 12/24/2018 1855   HGBUR NEGATIVE 12/24/2018 [place] 12/24/2018 1855   KETONESUR 5 (A) 12/24/2018 1855   PROTEINUR NEGATIVE 12/24/2018 1855   NITRITE NEGATIVE 12/24/2018 1855   LEUKOCYTESUR TRACE (A) 12/24/2018 1855   Sepsis Labs: @LABRCNTIP (procalcitonin:4,lacticidven:4)  ) Recent Results (from the past 240 hour(s))  SARS CORONAVIRUS 2 (TAT 6-24 HRS) Nasopharyngeal     Status: None   Collection Time: 12/24/18  6:55 PM   Specimen: Nasopharyngeal  Result Value Ref Range Status   SARS Coronavirus 2 NEGATIVE NEGATIVE Final    Comment: (NOTE) SARS-CoV-2 target nucleic acids are NOT DETECTED. The SARS-CoV-2 RNA is generally detectable in upper and lower respiratory specimens during the acute phase of infection. Negative results do not preclude SARS-CoV-2 infection, do not rule out co-infections with other pathogens, and should not be used as the sole basis  for treatment or other patient management decisions. Negative results must be combined with clinical observations, patient history, and epidemiological information. The expected result is Negative. Fact Sheet for Patients: SugarRoll.be Fact Sheet for Healthcare Providers: https://www.woods-mathews.com/ This test is not yet approved or cleared by the [place] FDA and  has been authorized for detection and/or diagnosis of SARS-CoV-2 by FDA under an Emergency Use Authorization (EUA). This EUA will remain  in effect (meaning this test can be used) for the duration of the COVID-19 declaration under Section 56 4(b)(1) of the Act, 21 U.S.C. section 360bbb-3(b)(1), unless the authorization is terminated or revoked sooner. Performed at [facility], [place] [address] [place], [place] [address]   Urine culture     Status: Abnormal   Collection Time: 12/24/18  6:55 PM   Specimen: Urine, Random  Result Value Ref Range Status   Specimen Description   Final    URINE, RANDOM Performed at [facility] [address], [place], [place] [address]    Special Requests   Final    NONE Performed at [facility], [place] [address], [place], [place] [address]    Culture >=100,000 COLONIES/mL ESCHERICHIA COLI (A)  Final   Report Status 12/27/2018 FINAL  Final   Organism ID, Bacteria ESCHERICHIA COLI (A)  Final      Susceptibility   Escherichia coli - MIC*    AMPICILLIN >=32 RESISTANT Resistant     CEFAZOLIN 8 SENSITIVE Sensitive     CEFTRIAXONE <=1 SENSITIVE Sensitive     CIPROFLOXACIN <=0.25 SENSITIVE Sensitive     GENTAMICIN <=1 SENSITIVE Sensitive     IMIPENEM <=0.25 SENSITIVE Sensitive     NITROFURANTOIN <=16 SENSITIVE Sensitive     TRIMETH/SULFA <=20 SENSITIVE Sensitive     AMPICILLIN/SULBACTAM >=32 RESISTANT Resistant     PIP/TAZO 8 SENSITIVE Sensitive     Extended ESBL NEGATIVE Sensitive     * >=100,000  COLONIES/mL ESCHERICHIA COLI  Blood culture (routine x 2)     Status: None (Preliminary result)   Collection Time: 12/24/18  6:56 PM   Specimen: BLOOD  Result Value Ref Range Status   Specimen Description   Final    BLOOD RIGHT WRIST Performed at [facility] [address], [place], [place] [address]    Special Requests   Final    BOTTLES DRAWN AEROBIC AND ANAEROBIC Blood Culture adequate volume Performed at [facility] [address], [place], [place] [address]    Culture   Final  NO GROWTH 3 DAYS Performed at [facility], [place] [address], [place], [place] [address]    Report Status PENDING  Incomplete  Blood culture (routine x 2)     Status: None (Preliminary result)   Collection Time: 12/24/18  7:01 PM   Specimen: BLOOD LEFT HAND  Result Value Ref Range Status   Specimen Description   Final    BLOOD LEFT HAND Performed at [facility] [address] [place], [place] [address]    Special Requests   Final    BOTTLES DRAWN AEROBIC ONLY Blood Culture adequate volume Performed at [facility] [address] [place], [place] [address]    Culture   Final    NO GROWTH 3 DAYS Performed at [facility], [place] [address] [place], [place] [address]    Report Status PENDING  Incomplete  SARS CORONAVIRUS 2 (TAT 6-24 HRS) Nasopharyngeal Nasopharyngeal Swab     Status: None   Collection Time: 12/28/18  4:13 PM   Specimen: Nasopharyngeal Swab  Result Value Ref Range Status   SARS Coronavirus 2 NEGATIVE NEGATIVE Final    Comment: (NOTE) SARS-CoV-2 target nucleic acids are NOT DETECTED. The SARS-CoV-2 RNA is generally detectable in upper and lower respiratory specimens during the acute phase of infection. Negative results do not preclude SARS-CoV-2 infection, do not rule out co-infections with other pathogens, and should not be used as the sole basis for treatment or other patient management  decisions. Negative results must be combined with clinical observations, patient history, and epidemiological information. The expected result is Negative. Fact Sheet for Patients: SugarRoll.be Fact Sheet for Healthcare Providers: https://www.woods-mathews.com/ This test is not yet approved or cleared by the [place] FDA and  has been authorized for detection and/or diagnosis of SARS-CoV-2 by FDA under an Emergency Use Authorization (EUA). This EUA will remain  in effect (meaning this test can be used) for the duration of the COVID-19 declaration under Section 56 4(b)(1) of the Act, 21 U.S.C. section 360bbb-3(b)(1), unless the authorization is terminated or revoked sooner. Performed at [facility], [place] [address], [place], [place] [address]       Studies: No results found.  Scheduled Meds: . apixaban  5 mg Oral BID  . cefdinir  300 mg Oral Q12H  . cyanocobalamin  1,000 mcg Intramuscular Daily  . docusate sodium  100 mg Oral BID  . imipramine  25 mg Oral QHS  . insulin aspart  0-5 Units Subcutaneous QHS  . insulin aspart  0-9 Units Subcutaneous TID WC  . losartan  25 mg Oral Daily  . pantoprazole  40 mg Oral Daily  . rosuvastatin  10 mg Oral q1800  . senna  2 tablet Oral QHS  . sertraline  25 mg Oral Daily    Continuous Infusions:    LOS: 4 days   Coding: total 25 min spent on this encounter with >50% of time on direct patient care and plan formulation.   Paticia Stack, MD [facility]  To reach me or the doctor on call, go to: [email or web address] Password [email or web address]  12/29/2018, 9:17 AM
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
## 2018-12-29 NOTE — Progress Notes (Signed)
Patients son Aaron Edelman called and updated with patients permission

## 2018-12-30 DIAGNOSIS — N390 Urinary tract infection, site not specified: Secondary | ICD-10-CM | POA: Diagnosis present

## 2018-12-30 LAB — BASIC METABOLIC PANEL
Anion gap: 12 (ref 5–15)
BUN: 16 mg/dL (ref 8–23)
CO2: 28 mmol/L (ref 22–32)
Calcium: 9.6 mg/dL (ref 8.9–10.3)
Chloride: 101 mmol/L (ref 98–111)
Creatinine, Ser: 0.93 mg/dL (ref 0.44–1.00)
GFR calc Af Amer: >60 mL/min (ref >60)
GFR calc non Af Amer: 57 mL/min — ABNORMAL LOW (ref >60)
Glucose, Bld: 142 mg/dL — ABNORMAL HIGH (ref 70–99)
Potassium: 3.9 mmol/L (ref 3.5–5.1)
Sodium: 141 mmol/L (ref 135–145)

## 2018-12-30 LAB — CULTURE, BLOOD (ROUTINE X 2)
Culture: NO GROWTH
Culture: NO GROWTH
Special Requests: ADEQUATE
Special Requests: ADEQUATE

## 2018-12-30 LAB — CBC
HCT: 32.9 % — ABNORMAL LOW (ref 36.0–46.0)
Hemoglobin: 10.1 g/dL — ABNORMAL LOW (ref 12.0–15.0)
MCH: 29.6 pg (ref 26.0–34.0)
MCHC: 30.7 g/dL (ref 30.0–36.0)
MCV: 96.5 fL (ref 80.0–100.0)
Platelets: 301 10*3/uL (ref 150–400)
RBC: 3.41 MIL/uL — ABNORMAL LOW (ref 3.87–5.11)
RDW: 12.8 % (ref 11.5–15.5)
WBC: 5.5 10*3/uL (ref 4.0–10.5)
nRBC: 0 % (ref 0.0–0.2)

## 2018-12-30 LAB — GLUCOSE, CAPILLARY
Glucose-Capillary: 119 mg/dL — ABNORMAL HIGH (ref 70–99)
Glucose-Capillary: 121 mg/dL — ABNORMAL HIGH (ref 70–99)
Glucose-Capillary: 123 mg/dL — ABNORMAL HIGH (ref 70–99)
Glucose-Capillary: 135 mg/dL — ABNORMAL HIGH (ref 70–99)

## 2018-12-30 MED ORDER — HYDROXYZINE HCL 25 MG PO TABS: 25.0000 mg | ORAL_TABLET | Freq: Three times a day (TID) | ORAL | 0 refills | Status: AC | PRN

## 2018-12-30 MED ORDER — CYANOCOBALAMIN 1000 MCG/ML IJ SOLN: 1000.0000 ug | INTRAMUSCULAR | 0 refills | Status: AC

## 2018-12-30 MED ORDER — METOPROLOL SUCCINATE ER 25 MG PO TB24: 25.0000 mg | ORAL_TABLET | Freq: Every day | ORAL | 11 refills | Status: AC

## 2018-12-30 NOTE — Progress Notes (Signed)
Patient discharging to [facility] for rehab.  Report called and given to [place], Therapist, sports.  IV removed - WNL.  Patient and family aware and agreeable.  Patient awaiting arrival of [facility] for transport.  AVS placed in packet at desk.  Patient in NAD at this time.

## 2018-12-30 NOTE — Discharge Summary (Signed)
Physician Discharge Summary  Brianna Burns:[id number] DOB: [birth year] DOA: 12/24/2018  PCP: Christain Sacramento, MD  Admit date: 12/24/2018 Discharge date: 12/30/2018  Admitted From: Home  Disposition: Rehab  Recommendations for Outpatient Follow-up:  1. Follow up with PCP in 1-2 weeks, hematology 40 within 1 week, orthopedic Follow up next Friday fur suture removal - please call [phone] to schedule / confirm. 2. Please obtain BMP/CBC in one week 3. Please follow up on the following pending results:  Home Health:  Equipment/Devices:  Discharge Condition: stable   CODE STATUS: full   Diet recommendation: Heart Healthy  /diabetic   Brief/Interim Summary: This is a 84 year old female with GERD hypertension, hyperlipidemia, type 2 diabetes mellitus, TIA on Jul 05, 2018 recurrent DVT and pulmonary embolism most recent one was September 26, 2018 status post IVC filter December 19, 2018 with recent admission December 21, 2018 for displaced right patella fracture status post ORIF.  She was admitted with altered mental status found to have urinary tract infection which is most likely the cause of her altered mental status  Patient was treated with IV Rocephin to which she responded very well.  Urine grew E. coli, Rocephin was transitioned to [facility] and 7 antibiotic course was completed.PT OT recommended short-term skilled rehab due to severe physical deconditioning of patient especially in lower extremities.  She was evaluated by orthopedic surgeon Dr. Lyla Glassing who recommended follow-up in 1 week in office for suture removal.  Metoprolol was started due to resting tachycardia.  Rest of her home medications for chronic conditions were continued as below.  On November 20, she was felt to be at baseline.  She did not have any new complaints and was discharged to rehab.  During her stay 1.  Altered mental status /metabolic encephalopathy likely secondary to e coli  urinary tract infection   brain CT was normal. Resolved  2.  Acute urinary tract infection  - switch Rocephin to cefdinir  3.  Tachycardia with elevated troponin tachycardia is resolved continue metoprolol echocardiogram was done and is still pending.  4.  History of recurrent DVT with pulmonary embolism status post IVC filter placement continue Eliquis  Hypertension continue metoprolol and losartan  5.  Hyperlipidemia on Crestor  6.  Type 2 diabetes mellitus Metformin is on hold we will continue sliding scale insulin  7.  Anxiety continue Zoloft and imipramine  8.  Recent right patella fracture repair continue tramadol as needed for pain   Discharge Diagnoses:  Principal Problem:   AMS (altered mental status) Active Problems:   HTN (hypertension)   DM (diabetes mellitus), type 2 ([place]), NIDDM   Displaced fracture of right patella   E. coli UTI (urinary tract infection)   Anemia, chronic disease   History of recurrent deep vein thrombosis (DVT)   Recurrent pulmonary embolism (HCC)   S/P IVC filter   Physical deconditioning   UTI (urinary tract infection)  Acute metabolic encephalopathy due to UTI Physical deconditioning  Discharge Instructions  Discharge Instructions    Diet - low sodium heart healthy   Complete by: As directed    Increase activity slowly   Complete by: As directed      Allergies as of 12/30/2018      Reactions   Ativan [lorazepam] Other (See Comments)   Pt unaware of where she was    Baclofen    Pt unaware of where she was    Hydrocodone-acetaminophen Diarrhea   Percocet [oxycodone-acetaminophen]    Tylox [oxycodone-acetaminophen] Nausea  And Vomiting      Medication List    TAKE these medications   Accu-Chek Aviva Soln   Accu-Chek FastClix Lancet Kit   Accu-Chek FastClix Lancets Misc   CALCIUM + D PO Take 1 tablet by mouth 2 (two) times daily. Viactiv   cyanocobalamin 1000 MCG/ML injection Commonly known as: (VITAMIN B-12) Inject 1 mL (1,000  mcg total) into the muscle every 30 (thirty) days for 4 doses.   docusate sodium 100 MG capsule Commonly known as: COLACE Take 1 capsule (100 mg total) by mouth 2 (two) times daily.   Eliquis 5 MG Tabs tablet Generic drug: apixaban Take 5 mg by mouth 2 (two) times daily.   glucose blood test strip Commonly known as: Accu-Chek Aviva Plus Dx 250.00  Check sugar 1-2 times daily   hydrOXYzine 25 MG tablet Commonly known as: ATARAX/VISTARIL Take 1 tablet (25 mg total) by mouth 3 (three) times daily as needed for anxiety.   imipramine 25 MG tablet Commonly known as: TOFRANIL Take 25 mg by mouth at bedtime.   losartan 25 MG tablet Commonly known as: COZAAR Take 1 tablet (25 mg total) by mouth daily.   metFORMIN 1000 MG tablet Commonly known as: GLUCOPHAGE Take 1,000 mg by mouth daily with breakfast.   metoprolol succinate 25 MG 24 hr tablet Commonly known as: Toprol XL Take 1 tablet (25 mg total) by mouth daily.   omeprazole 40 MG capsule Commonly known as: PRILOSEC Take 1 capsule (40 mg total) by mouth daily.   ondansetron 4 MG tablet Commonly known as: ZOFRAN Take 1 tablet (4 mg total) by mouth every 6 (six) hours as needed for nausea.   Refresh Plus 0.5 % Soln Generic drug: Carboxymethylcellulose Sod PF Place 1 drop into the left eye 3 (three) times daily as needed (dry eye).   rosuvastatin 10 MG tablet Commonly known as: CRESTOR Take 10 mg by mouth daily.   senna 8.6 MG Tabs tablet Commonly known as: SENOKOT Take 2 tablets (17.2 mg total) by mouth at bedtime.   sertraline 25 MG tablet Commonly known as: ZOLOFT Take 25 mg by mouth daily.   traMADol 50 MG tablet Commonly known as: ULTRAM Take 1 tablet (50 mg total) by mouth every 6 (six) hours as needed for moderate pain (pain level 4-6).   VITAMIN D PO Take 5,000 Units by mouth daily.      Contact information for after-discharge care    Destination    [facility]  Preferred SNF .   Service: Skilled Nursing Contact information: [address] [place] [phone]             Allergies  Allergen Reactions  . Ativan [Lorazepam] Other (See Comments)    Pt unaware of where she was   . Baclofen     Pt unaware of where she was   . Hydrocodone-Acetaminophen Diarrhea  . Percocet [Oxycodone-Acetaminophen]   . Tylox [Oxycodone-Acetaminophen] Nausea And Vomiting    Consultations:     Procedures/Studies: Ct Head Wo Contrast  Result Date: 12/24/2018 CLINICAL DATA:  Altered mental status EXAM: CT HEAD WITHOUT CONTRAST TECHNIQUE: Contiguous axial images were obtained from the base of the skull through the vertex without intravenous contrast. COMPARISON:  None. FINDINGS: Brain: Chronic atrophic changes and ischemic changes are seen. No findings to suggest acute hemorrhage, acute infarction or space-occupying mass lesion are seen. Vascular: No hyperdense vessel or unexpected calcification. Skull: Normal. Negative for fracture or focal  lesion. Sinuses/Orbits: No acute finding. Other: None. IMPRESSION: Chronic atrophic and ischemic changes without acute abnormality. Electronically Signed   By: Inez Catalina M.D.   On: 12/24/2018 23:54   Dg Knee 4 Views W/patella Right  Result Date: 12/21/2018 CLINICAL DATA:  Patella ORIF. EXAM: RIGHT KNEE - COMPLETE 4+ VIEW COMPARISON:  None. FLUOROSCOPY TIME:  45 seconds. C-arm fluoroscopic images were obtained intraoperatively and submitted for post operative interpretation. FINDINGS: Intraoperative fluoroscopic images demonstrate interval ORIF of a transverse patellar fracture with two screws. Alignment is anatomic. IMPRESSION: Intraoperative fluoroscopic guidance for patella ORIF. Electronically Signed   By: Titus Dubin M.D.   On: 12/21/2018 16:30   Dg Knee Right [place]  Result Date: 12/21/2018 CLINICAL DATA:  84 year old female status post ORIF of the patella. EXAM: PORTABLE RIGHT  KNEE - 1-2 VIEW COMPARISON:  Intraoperative fluoroscopy image dated 12/21/2018. FINDINGS: Two fixation screws noted through the patellar fracture. The fracture fragments appear in anatomic alignment. The screws are intact. No other fracture identified. The bones are osteopenic. Mild to moderate arthritic changes of the knee with mild joint space narrowing and meniscal chondrocalcinosis. There is a small suprapatellar effusion. Cutaneous staples noted over the knee. IMPRESSION: Status post ORIF of the patellar fracture. The fracture fragments appear in anatomic alignment. No other fracture. Electronically Signed   By: Anner Crete M.D.   On: 12/21/2018 16:56   Dg C-arm 1-60 Min-no Report  Result Date: 12/21/2018 Fluoroscopy was utilized by the requesting physician.  No radiographic interpretation.       Subjective:   Discharge Exam: Vitals:   12/30/18 1432 12/30/18 1435  BP: (!) 156/103 122/81  Pulse: (!) 102 (!) 109  Resp: 16 18  Temp: 98.1 F (36.7 C)   SpO2: 96% 96%   Vitals:   12/30/18 0507 12/30/18 0700 12/30/18 1432 12/30/18 1435  BP: (!) 153/83  (!) 156/103 122/81  Pulse: 95  (!) 102 (!) 109  Resp: 20  16 18   Temp: 98 F (36.7 C)  98.1 F (36.7 C)   TempSrc:   Oral   SpO2: (!) 85% 95% 96% 96%  Weight:      Height:        General: Pt is alert, awake, not in acute distress Cardiovascular: RRR, S1/S2 +, no rubs, no gallops Respiratory: CTA bilaterally, no wheezing, no rhonchi Abdominal: Soft, NT, ND, bowel sounds + Extremities: no edema, no cyanosis    The results of significant diagnostics from this hospitalization (including imaging, microbiology, ancillary and laboratory) are listed below for reference.     Microbiology: Recent Results (from the past 240 hour(s))  SARS CORONAVIRUS 2 (TAT 6-24 HRS) Nasopharyngeal     Status: None   Collection Time: 12/24/18  6:55 PM   Specimen: Nasopharyngeal  Result Value Ref Range Status   SARS Coronavirus 2 NEGATIVE  NEGATIVE Final    Comment: (NOTE) SARS-CoV-2 target nucleic acids are NOT DETECTED. The SARS-CoV-2 RNA is generally detectable in upper and lower respiratory specimens during the acute phase of infection. Negative results do not preclude SARS-CoV-2 infection, do not rule out co-infections with other pathogens, and should not be used as the sole basis for treatment or other patient management decisions. Negative results must be combined with clinical observations, patient history, and epidemiological information. The expected result is Negative. Fact Sheet for Patients: SugarRoll.be Fact Sheet for Healthcare Providers: https://www.woods-mathews.com/ This test is not yet approved or cleared by the [place] FDA and  has been authorized for detection  and/or diagnosis of SARS-CoV-2 by FDA under an Emergency Use Authorization (EUA). This EUA will remain  in effect (meaning this test can be used) for the duration of the COVID-19 declaration under Section 56 4(b)(1) of the Act, 21 U.S.C. section 360bbb-3(b)(1), unless the authorization is terminated or revoked sooner. Performed at [facility], [place] [address] [place], [place] [address]   Urine culture     Status: Abnormal   Collection Time: 12/24/18  6:55 PM   Specimen: Urine, Random  Result Value Ref Range Status   Specimen Description   Final    URINE, RANDOM Performed at [facility] [address], [place], [place] [address]    Special Requests   Final    NONE Performed at [facility], [place] [address] [place], [place] [address]    Culture >=100,000 COLONIES/mL ESCHERICHIA COLI (A)  Final   Report Status 12/27/2018 FINAL  Final   Organism ID, Bacteria ESCHERICHIA COLI (A)  Final      Susceptibility   Escherichia coli - MIC*    AMPICILLIN >=32 RESISTANT Resistant     CEFAZOLIN 8 SENSITIVE Sensitive     CEFTRIAXONE <=1 SENSITIVE Sensitive      CIPROFLOXACIN <=0.25 SENSITIVE Sensitive     GENTAMICIN <=1 SENSITIVE Sensitive     IMIPENEM <=0.25 SENSITIVE Sensitive     NITROFURANTOIN <=16 SENSITIVE Sensitive     TRIMETH/SULFA <=20 SENSITIVE Sensitive     AMPICILLIN/SULBACTAM >=32 RESISTANT Resistant     PIP/TAZO 8 SENSITIVE Sensitive     Extended ESBL NEGATIVE Sensitive     * >=100,000 COLONIES/mL ESCHERICHIA COLI  Blood culture (routine x 2)     Status: None   Collection Time: 12/24/18  6:56 PM   Specimen: BLOOD  Result Value Ref Range Status   Specimen Description   Final    BLOOD RIGHT WRIST Performed at [facility] [address] [place], [place] [address]    Special Requests   Final    BOTTLES DRAWN AEROBIC AND ANAEROBIC Blood Culture adequate volume Performed at [facility] [address], [place], [place] [address]    Culture   Final    NO GROWTH 5 DAYS Performed at [facility], [place] [address] [place], [place] [address]    Report Status 12/30/2018 FINAL  Final  Blood culture (routine x 2)     Status: None   Collection Time: 12/24/18  7:01 PM   Specimen: BLOOD LEFT HAND  Result Value Ref Range Status   Specimen Description   Final    BLOOD LEFT HAND Performed at [place] [address], [place], [place] [address]    Special Requests   Final    BOTTLES DRAWN AEROBIC ONLY Blood Culture adequate volume Performed at [facility] [address] [place],  [address]    Culture   Final    NO GROWTH 5 DAYS Performed at [facility], [place] [address] [place],  [address]    Report Status 12/30/2018 FINAL  Final  SARS CORONAVIRUS 2 (TAT 6-24 HRS) Nasopharyngeal Nasopharyngeal Swab     Status: None   Collection Time: 12/28/18  4:13 PM   Specimen: Nasopharyngeal Swab  Result Value Ref Range Status   SARS Coronavirus 2 NEGATIVE NEGATIVE Final    Comment: (NOTE) SARS-CoV-2 target nucleic acids are NOT  DETECTED. The SARS-CoV-2 RNA is generally detectable in upper and lower respiratory specimens  during the acute phase of infection. Negative results do not preclude SARS-CoV-2 infection, do not rule out co-infections with other pathogens, and should not be used as the sole basis for treatment or other patient management decisions. Negative results must be combined with clinical observations, patient history, and epidemiological information. The expected result is Negative. Fact Sheet for Patients: SugarRoll.be Fact Sheet for Healthcare Providers: https://www.woods-mathews.com/ This test is not yet approved or cleared by the [place] FDA and  has been authorized for detection and/or diagnosis of SARS-CoV-2 by FDA under an Emergency Use Authorization (EUA). This EUA will remain  in effect (meaning this test can be used) for the duration of the COVID-19 declaration under Section 56 4(b)(1) of the Act, 21 U.S.C. section 360bbb-3(b)(1), unless the authorization is terminated or revoked sooner. Performed at [facility], [place] [address] [place],  [address]      Labs: BNP (last 3 results) No results for input(s): BNP in the last 8760 hours. Basic Metabolic Panel: Recent Labs  Lab 12/25/18 0353 12/27/18 0546 12/28/18 0502 12/29/18 0515 12/30/18 0532  NA 132* 134* 136 135 141  K 3.8 4.2 4.3 3.9 3.9  CL 97* 100 99 98 101  CO2 25 27 27 24 28   GLUCOSE 229* 129* 151* 104* 142*  BUN 14 20 21 14 16   CREATININE 0.82 1.12* 0.97 0.91 0.93  CALCIUM 9.0 8.9 9.3 9.2 9.6   Liver Function Tests: Recent Labs  Lab 12/24/18 2016 12/25/18 0353  AST 18 16  ALT 12 11  ALKPHOS 48 43  BILITOT 1.0 1.1  PROT 6.6 6.3*  ALBUMIN 3.3* 3.1*   No results for input(s): LIPASE, AMYLASE in the last 168 hours. No results for input(s): AMMONIA in the last 168 hours. CBC: Recent Labs  Lab 12/24/18 2016 12/25/18 0353 12/29/18 0515  12/30/18 0532  WBC 12.4* 14.0* 4.3 5.5  NEUTROABS 10.2*  --   --   --   HGB 10.5* 10.5* 9.7* 10.1*  HCT 33.7* 33.5* 32.2* 32.9*  MCV 96.3 96.3 98.8 96.5  PLT 198 203 262 301   Cardiac Enzymes: No results for input(s): CKTOTAL, CKMB, CKMBINDEX, TROPONINI in the last 168 hours. BNP: Invalid input(s): POCBNP CBG: Recent Labs  Lab 12/29/18 1138 12/29/18 1556 12/29/18 2024 12/30/18 0745 12/30/18 1215  GLUCAP 112* 115* 164* 119* 121*   D-Dimer No results for input(s): DDIMER in the last 72 hours. Hgb A1c No results for input(s): HGBA1C in the last 72 hours. Lipid Profile No results for input(s): CHOL, HDL, LDLCALC, TRIG, CHOLHDL, LDLDIRECT in the last 72 hours. Thyroid function studies No results for input(s): TSH, T4TOTAL, T3FREE, THYROIDAB in the last 72 hours.  Invalid input(s): FREET3 Anemia work up No results for input(s): VITAMINB12, FOLATE, FERRITIN, TIBC, IRON, RETICCTPCT in the last 72 hours. Urinalysis    Component Value Date/Time   COLORURINE YELLOW 12/24/2018 1855   APPEARANCEUR HAZY (A) 12/24/2018 1855   LABSPEC 1.021 12/24/2018 1855   PHURINE 5.0 12/24/2018 1855   GLUCOSEU 50 (A) 12/24/2018 1855   HGBUR NEGATIVE 12/24/2018 1855   BILIRUBINUR NEGATIVE 12/24/2018 1855   KETONESUR 5 (A) 12/24/2018 1855   PROTEINUR NEGATIVE 12/24/2018 1855   NITRITE NEGATIVE 12/24/2018 1855   LEUKOCYTESUR TRACE (A) 12/24/2018 1855   Sepsis Labs Invalid input(s): PROCALCITONIN,  WBC,  LACTICIDVEN Microbiology Recent Results (from the past 240 hour(s))  SARS CORONAVIRUS 2 (TAT 6-24 HRS) Nasopharyngeal     Status: None   Collection Time: 12/24/18  6:55 PM  Specimen: Nasopharyngeal  Result Value Ref Range Status   SARS Coronavirus 2 NEGATIVE NEGATIVE Final    Comment: (NOTE) SARS-CoV-2 target nucleic acids are NOT DETECTED. The SARS-CoV-2 RNA is generally detectable in upper and lower respiratory specimens during the acute phase of infection. Negative results do not  preclude SARS-CoV-2 infection, do not rule out co-infections with other pathogens, and should not be used as the sole basis for treatment or other patient management decisions. Negative results must be combined with clinical observations, patient history, and epidemiological information. The expected result is Negative. Fact Sheet for Patients: SugarRoll.be Fact Sheet for Healthcare Providers: https://www.woods-mathews.com/ This test is not yet approved or cleared by the [place] FDA and  has been authorized for detection and/or diagnosis of SARS-CoV-2 by FDA under an Emergency Use Authorization (EUA). This EUA will remain  in effect (meaning this test can be used) for the duration of the COVID-19 declaration under Section 56 4(b)(1) of the Act, 21 U.S.C. section 360bbb-3(b)(1), unless the authorization is terminated or revoked sooner. Performed at [facility], [place] [address] [place], [place] [address]   Urine culture     Status: Abnormal   Collection Time: 12/24/18  6:55 PM   Specimen: Urine, Random  Result Value Ref Range Status   Specimen Description   Final    URINE, RANDOM Performed at [facility] [address] [place], [place] [address]    Special Requests   Final    NONE Performed at [facility], [place] [address] [place], [place] [address]    Culture >=100,000 COLONIES/mL ESCHERICHIA COLI (A)  Final   Report Status 12/27/2018 FINAL  Final   Organism ID, Bacteria ESCHERICHIA COLI (A)  Final      Susceptibility   Escherichia coli - MIC*    AMPICILLIN >=32 RESISTANT Resistant     CEFAZOLIN 8 SENSITIVE Sensitive     CEFTRIAXONE <=1 SENSITIVE Sensitive     CIPROFLOXACIN <=0.25 SENSITIVE Sensitive     GENTAMICIN <=1 SENSITIVE Sensitive     IMIPENEM <=0.25 SENSITIVE Sensitive     NITROFURANTOIN <=16 SENSITIVE Sensitive     TRIMETH/SULFA <=20 SENSITIVE Sensitive      AMPICILLIN/SULBACTAM >=32 RESISTANT Resistant     PIP/TAZO 8 SENSITIVE Sensitive     Extended ESBL NEGATIVE Sensitive     * >=100,000 COLONIES/mL ESCHERICHIA COLI  Blood culture (routine x 2)     Status: None   Collection Time: 12/24/18  6:56 PM   Specimen: BLOOD  Result Value Ref Range Status   Specimen Description   Final    BLOOD RIGHT WRIST Performed at [facility] [address], [place], [place] [address]    Special Requests   Final    BOTTLES DRAWN AEROBIC AND ANAEROBIC Blood Culture adequate volume Performed at [facility] [address] [place], [place] [address]    Culture   Final    NO GROWTH 5 DAYS Performed at [facility], [place] [address], [place], [place] [address]    Report Status 12/30/2018 FINAL  Final  Blood culture (routine x 2)     Status: None   Collection Time: 12/24/18  7:01 PM   Specimen: BLOOD LEFT HAND  Result Value Ref Range Status   Specimen Description   Final    BLOOD LEFT HAND Performed at [facility] [address] [place],  [address]    Special Requests   Final  BOTTLES DRAWN AEROBIC ONLY Blood Culture adequate volume Performed at [facility] [address] [place], [place] [address]    Culture   Final    NO GROWTH 5 DAYS Performed at [facility], [place] [address], [place], [place] [address]    Report Status 12/30/2018 FINAL  Final  SARS CORONAVIRUS 2 (TAT 6-24 HRS) Nasopharyngeal Nasopharyngeal Swab     Status: None   Collection Time: 12/28/18  4:13 PM   Specimen: Nasopharyngeal Swab  Result Value Ref Range Status   SARS Coronavirus 2 NEGATIVE NEGATIVE Final    Comment: (NOTE) SARS-CoV-2 target nucleic acids are NOT DETECTED. The SARS-CoV-2 RNA is generally detectable in upper and lower respiratory specimens during the acute phase of infection. Negative results do not preclude SARS-CoV-2 infection, do not rule out co-infections  with other pathogens, and should not be used as the sole basis for treatment or other patient management decisions. Negative results must be combined with clinical observations, patient history, and epidemiological information. The expected result is Negative. Fact Sheet for Patients: SugarRoll.be Fact Sheet for Healthcare Providers: https://www.woods-mathews.com/ This test is not yet approved or cleared by the [place] FDA and  has been authorized for detection and/or diagnosis of SARS-CoV-2 by FDA under an Emergency Use Authorization (EUA). This EUA will remain  in effect (meaning this test can be used) for the duration of the COVID-19 declaration under Section 56 4(b)(1) of the Act, 21 U.S.C. section 360bbb-3(b)(1), unless the authorization is terminated or revoked sooner. Performed at [facility], [place] [address], [place], [place] [address]      Time coordinating discharge: Over 35  minutes  SIGNED:   Vicenta Dunning, MD  [facility] 12/30/2018, 2:58 PM Pager   If 7PM-7AM, please contact night-coverage [email or web address] Password [email or web address]

## 2018-12-30 NOTE — TOC Transition Note (Signed)
Transition of Care [facility]) - CM/SW Discharge Note   Patient Details  Name: TAMMARA FARIS MRN: [id number] Date of Birth: 1934-04-20  [facility] CM/SW Contact:  Wende Neighbors, LCSW Phone Number: 12/30/2018, 3:18 PM   Clinical Narrative:   Patient to discharge to [facility] via [place]. RN to please call [phone] (rm# [phone]) for report. CSW spoke with patients daughter and she is agreeable with discharge to facility     Final next level of care: [place] Barriers to Discharge: No Barriers Identified   Patient Goals and CMS Choice Patient states their goals for this hospitalization and ongoing recovery are:: go to rehab CMS Medicare.gov Compare Post Acute Care list provided to:: Patient    Discharge Placement PASRR number recieved: 12/27/18            Patient [facility] bed at: [facility] Patient to be transferred to facility by: [facility]tar Name of family member notified: sherri kallan Patient and family notified of of transfer: 12/30/18  Discharge Plan and Services   Discharge Planning Services: CM Consult                                 Social Determinants of Health ([place]) Interventions     Readmission Risk Interventions No flowsheet data found.

## 2019-01-09 ENCOUNTER — Other Ambulatory Visit: Payer: Self-pay | Admitting: Family Medicine

## 2019-01-09 DIAGNOSIS — Z1231 Encounter for screening mammogram for malignant neoplasm of breast: Secondary | ICD-10-CM

## 2019-03-01 ENCOUNTER — Ambulatory Visit: Payer: Medicare Other | Attending: Internal Medicine

## 2019-03-01 DIAGNOSIS — Z23 Encounter for immunization: Secondary | ICD-10-CM | POA: Insufficient documentation

## 2019-03-01 NOTE — Progress Notes (Signed)
   Covid-19 Vaccination Clinic  Name:  Brianna Burns    MRN: [id number] DOB: [birth year]  03/01/2019  [name] was observed post Covid-19 immunization for 15 minutes without incidence. She was provided with Vaccine Information Sheet and instruction to access the V-Safe system.   [name] was instructed to call 911 with any severe reactions post vaccine: Marland Kitchen Difficulty breathing  . Swelling of your face and throat  . A fast heartbeat  . A bad rash all over your body  . Dizziness and weakness    Immunizations Administered    Name Date Dose VIS Date Route   Pfizer COVID-19 Vaccine 03/01/2019 11:58 AM 0.3 mL 01/20/2019 Intramuscular   Manufacturer: [place]   Lot: [id number]   [place]: [id number]

## 2019-03-02 ENCOUNTER — Ambulatory Visit
Admission: RE | Admit: 2019-03-02 | Discharge: 2019-03-02 | Disposition: A | Discharge status: Home / Self Care | Payer: Medicare Other | Source: Ambulatory Visit | Attending: Family Medicine | Admitting: Family Medicine

## 2019-03-02 ENCOUNTER — Other Ambulatory Visit: Payer: Self-pay

## 2019-03-02 DIAGNOSIS — Z1231 Encounter for screening mammogram for malignant neoplasm of breast: Secondary | ICD-10-CM

## 2019-03-20 ENCOUNTER — Ambulatory Visit: Payer: Medicare Other | Attending: Internal Medicine

## 2019-03-20 DIAGNOSIS — Z23 Encounter for immunization: Secondary | ICD-10-CM | POA: Insufficient documentation

## 2019-03-20 NOTE — Progress Notes (Signed)
   Covid-19 Vaccination Clinic  Name:  Brianna Burns    MRN: [id number] DOB: Jan 31, 1935  03/20/2019  [name] was observed post Covid-19 immunization for 15 minutes without incidence. She was provided with Vaccine Information Sheet and instruction to access the V-Safe system.   [name] was instructed to call 911 with any severe reactions post vaccine: Marland Kitchen Difficulty breathing  . Swelling of your face and throat  . A fast heartbeat  . A bad rash all over your body  . Dizziness and weakness    Immunizations Administered    Name Date Dose VIS Date Route   Pfizer COVID-19 Vaccine 03/20/2019  3:11 PM 0.3 mL 01/20/2019 Intramuscular   Manufacturer: [place]   Lot: [id number]   [place]: [id number]

## 2019-04-04 ENCOUNTER — Other Ambulatory Visit: Payer: Self-pay

## 2019-04-07 ENCOUNTER — Other Ambulatory Visit (HOSPITAL_COMMUNITY)
Admission: RE | Admit: 2019-04-07 | Discharge: 2019-04-07 | Disposition: A | Discharge status: Home / Self Care | Payer: Medicare Other | Source: Ambulatory Visit | Attending: Vascular Surgery | Admitting: Vascular Surgery

## 2019-04-07 ENCOUNTER — Telehealth: Payer: Self-pay

## 2019-04-07 DIAGNOSIS — Z01812 Encounter for preprocedural laboratory examination: Secondary | ICD-10-CM | POA: Diagnosis present

## 2019-04-07 DIAGNOSIS — Z20822 Contact with and (suspected) exposure to covid-19: Secondary | ICD-10-CM | POA: Insufficient documentation

## 2019-04-07 LAB — SARS CORONAVIRUS 2 (TAT 6-24 HRS): SARS Coronavirus 2: NEGATIVE

## 2019-04-07 NOTE — Telephone Encounter (Signed)
Spoke to [employer] (pt's daughter) to go over pre-op instructions. We discussed holding Metformin and Eliquis, nothing to eat or drink after midnight and to eat a high protein snack late at night before bed. No further questions/concerns at this time.

## 2019-04-08 ENCOUNTER — Other Ambulatory Visit (HOSPITAL_COMMUNITY): Payer: Medicare Other

## 2019-04-10 ENCOUNTER — Ambulatory Visit (HOSPITAL_COMMUNITY)
Admission: RE | Admit: 2019-04-10 | Discharge: 2019-04-10 | Disposition: A | Discharge status: Home / Self Care | Payer: Medicare Other | Attending: Vascular Surgery | Admitting: Vascular Surgery

## 2019-04-10 ENCOUNTER — Ambulatory Visit: Admit: 2019-04-10 | Payer: Medicare Other | Admitting: Vascular Surgery

## 2019-04-10 ENCOUNTER — Other Ambulatory Visit: Payer: Self-pay

## 2019-04-10 ENCOUNTER — Encounter (HOSPITAL_COMMUNITY)
Admission: RE | Disposition: A | Discharge status: Home / Self Care | Payer: Self-pay | Source: Home / Self Care | Attending: Vascular Surgery

## 2019-04-10 DIAGNOSIS — Z452 Encounter for adjustment and management of vascular access device: Secondary | ICD-10-CM | POA: Insufficient documentation

## 2019-04-10 DIAGNOSIS — I10 Essential (primary) hypertension: Secondary | ICD-10-CM | POA: Diagnosis not present

## 2019-04-10 DIAGNOSIS — Z7901 Long term (current) use of anticoagulants: Secondary | ICD-10-CM | POA: Insufficient documentation

## 2019-04-10 DIAGNOSIS — E785 Hyperlipidemia, unspecified: Secondary | ICD-10-CM | POA: Diagnosis not present

## 2019-04-10 DIAGNOSIS — E119 Type 2 diabetes mellitus without complications: Secondary | ICD-10-CM | POA: Diagnosis not present

## 2019-04-10 DIAGNOSIS — Z79899 Other long term (current) drug therapy: Secondary | ICD-10-CM | POA: Diagnosis not present

## 2019-04-10 DIAGNOSIS — Z7984 Long term (current) use of oral hypoglycemic drugs: Secondary | ICD-10-CM | POA: Insufficient documentation

## 2019-04-10 DIAGNOSIS — Z86718 Personal history of other venous thrombosis and embolism: Secondary | ICD-10-CM

## 2019-04-10 DIAGNOSIS — Z86711 Personal history of pulmonary embolism: Secondary | ICD-10-CM | POA: Insufficient documentation

## 2019-04-10 DIAGNOSIS — K219 Gastro-esophageal reflux disease without esophagitis: Secondary | ICD-10-CM | POA: Insufficient documentation

## 2019-04-10 DIAGNOSIS — Z885 Allergy status to narcotic agent status: Secondary | ICD-10-CM | POA: Diagnosis not present

## 2019-04-10 HISTORY — PX: IVC FILTER REMOVAL: CATH118246

## 2019-04-10 LAB — POCT I-STAT, CHEM 8
BUN: 33 mg/dL — ABNORMAL HIGH (ref 8–23)
Calcium, Ion: 1.18 mmol/L (ref 1.15–1.40)
Chloride: 102 mmol/L (ref 98–111)
Creatinine, Ser: 1.1 mg/dL — ABNORMAL HIGH (ref 0.44–1.00)
Glucose, Bld: 139 mg/dL — ABNORMAL HIGH (ref 70–99)
HCT: 32 % — ABNORMAL LOW (ref 36.0–46.0)
Hemoglobin: 10.9 g/dL — ABNORMAL LOW (ref 12.0–15.0)
Potassium: 4.3 mmol/L (ref 3.5–5.1)
Sodium: 140 mmol/L (ref 135–145)
TCO2: 30 mmol/L (ref 22–32)

## 2019-04-10 SURGERY — IVC FILTER REMOVAL
Anesthesia: LOCAL

## 2019-04-10 SURGERY — INSERTION, FILTER, VENA CAVA
Anesthesia: Monitor Anesthesia Care

## 2019-04-10 MED ORDER — MIDAZOLAM HCL 2 MG/2ML IJ SOLN
INTRAMUSCULAR | Status: DC | PRN
  Administered 2019-04-10: 1 mg via INTRAVENOUS

## 2019-04-10 MED ORDER — LIDOCAINE HCL (PF) 1 % IJ SOLN
INTRAMUSCULAR | Status: AC
  Filled 2019-04-10: qty 30

## 2019-04-10 MED ORDER — IODIXANOL 320 MG/ML IV SOLN
INTRAVENOUS | Status: DC | PRN
  Administered 2019-04-10: 10 mL via INTRAVENOUS

## 2019-04-10 MED ORDER — MIDAZOLAM HCL 2 MG/2ML IJ SOLN
INTRAMUSCULAR | Status: AC
  Filled 2019-04-10: qty 2

## 2019-04-10 MED ORDER — FENTANYL CITRATE (PF) 100 MCG/2ML IJ SOLN
INTRAMUSCULAR | Status: DC | PRN
  Administered 2019-04-10: 50 ug via INTRAVENOUS

## 2019-04-10 MED ORDER — LIDOCAINE HCL (PF) 1 % IJ SOLN
INTRAMUSCULAR | Status: DC | PRN
  Administered 2019-04-10: 10 mL via INTRADERMAL

## 2019-04-10 MED ORDER — HEPARIN (PORCINE) IN NACL 1000-0.9 UT/500ML-% IV SOLN
INTRAVENOUS | Status: AC
  Filled 2019-04-10: qty 500

## 2019-04-10 MED ORDER — HEPARIN (PORCINE) IN NACL 1000-0.9 UT/500ML-% IV SOLN
INTRAVENOUS | Status: DC | PRN
  Administered 2019-04-10: 500 mL

## 2019-04-10 MED ORDER — SODIUM CHLORIDE 0.9 % IV SOLN: INTRAVENOUS | Status: DC

## 2019-04-10 MED ORDER — FENTANYL CITRATE (PF) 100 MCG/2ML IJ SOLN
INTRAMUSCULAR | Status: AC
  Filled 2019-04-10: qty 2

## 2019-04-10 SURGICAL SUPPLY — 14 items
CATH ANGIO 5F BER2 65CM (CATHETERS) ×2 IMPLANT
COVER DOME SNAP 22 D (MISCELLANEOUS) ×2 IMPLANT
DEVICE ONE SNARE 25MM (VASCULAR PRODUCTS) ×2 IMPLANT
DRYSEAL FLEXSHEATH 12FR 45CM (SHEATH) ×1
KIT MICROPUNCTURE NIT STIFF (SHEATH) ×2 IMPLANT
KIT PV (KITS) ×2 IMPLANT
PROTECTION STATION PRESSURIZED (MISCELLANEOUS) ×2
SHEATH DRYSEAL FLEX 12FR 45CM (SHEATH) ×1 IMPLANT
SHEATH PROBE COVER 6X72 (BAG) ×2 IMPLANT
STATION PROTECTION PRESSURIZED (MISCELLANEOUS) ×1 IMPLANT
SYR MEDRAD MARK 7 150ML (SYRINGE) ×2 IMPLANT
TRANSDUCER W/STOPCOCK (MISCELLANEOUS) ×2 IMPLANT
TRAY PV CATH (CUSTOM PROCEDURE TRAY) ×2 IMPLANT
WIRE BENTSON .035X145CM (WIRE) ×2 IMPLANT
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
## 2019-04-10 NOTE — Op Note (Signed)
    Patient name: Brianna Burns MRN: [id number] DOB: 11/11/1934 Sex: female  04/10/2019 Pre-operative Diagnosis: history of dvt and ivc filter placement Post-operative diagnosis:  Same Surgeon:  Eda Paschal. Donzetta Matters, MD Procedure Performed: 1.  Ultrasound-guided cannulation right internal jugular vein 2.  Removal of IVC filter 3.  IVC venogram 4.  Moderate sedation with fentanyl and Versed for 14 minutes  Indications: 85 year old female has a history of IVC filter placement for extensive history of DVT and required ORIF of her right patella.  She is now completed therapy with her filter and is indicated for removal.  Findings:  IVC filter was removed.  Completion venogram demonstrated no flow-limiting lesions in her IVC and there is no perforation identified.   Procedure:  The patient was identified in the holding area and taken to room 8.  The patient was then placed supine on the table and prepped and draped in the usual sterile fashion.  A time out was called.  Ultrasound was used to evaluate the right internal jugular vein.  This was initially cannulated micropuncture needle under direct ultrasound visualization.  An image was sent to the permanent record.  Initially the wire passed cephalad.  I remove the micropuncture wire and needle.  I then used a 18-gauge needle and cannulated the internal jugular vein with that.  A Bentson wire was placed centrally.  A nick was made in the skin.  The wire was able to be placed down into the IVC.  I then placed a long 12 French sheath.  A snare catheter with Ber catheter were placed through the sheath.  The loop was snared.  Sheath was advanced over the filter and it was removed.  Completion angiogram demonstrated no abnormalities.  Sheath was removed and pressure held until hemostasis obtained.  She tolerated procedure without any complication.  Contrast: 10 cc   Cutberto Winfree C. Donzetta Matters, MD [facility] Office: [phone] Pager:  [phone]

## 2019-04-10 NOTE — Discharge Instructions (Signed)
Inferior Vena Cava Filter Removal, Care After This sheet gives you information about how to care for yourself after your procedure. Your health care provider may also give you more specific instructions. If you have problems or questions, contact your health care provider. What can I expect after the procedure? After the procedure, it is common to have:  Mild pain and bruising around your incision in your neck or groin.  Fatigue. Follow these instructions at home: Incision care   Follow instructions from your health care provider about how to take care of your incision. Make sure you: ? Wash your hands with soap and water before you change your bandage (dressing). If soap and water are not available, use hand sanitizer. ? Change your dressing as told by your health care provider.  Check your incision area every day for signs of infection. Check for: ? Redness, swelling, or more pain. ? Fluid or blood. ? Warmth. ? Pus or a bad smell. General instructions  Take over-the-counter and prescription medicines only as told by your health care provider.  Do not take baths, swim, or use a hot tub until your health care provider approves. Ask your health care provider if you may take showers. You may only be allowed to take sponge baths.  Do not drive for 24 hours if you were given a medicine to help you relax (sedative) during your procedure.  Return to your normal activities as told by your health care provider. Ask your health care provider what activities are safe for you.  Keep all follow-up visits as told by your health care provider. This is important. Contact a health care provider if:  You have chills or a fever.  You have redness, swelling, or more pain around your incision.  Your incision feels warm to the touch.  You have pus or a bad smell coming from your incision. Get help right away if:  You have blood coming from your incision (active bleeding). ? If you have  bleeding from the incision site, lie down, apply pressure to the area with a clean cloth or gauze, and get help right away.  You have chest pain.  You have difficulty breathing. Summary  Follow instructions from your health care provider about how to take care of your incision.  Return to your normal activities as told by your health care provider.  Check your incision area every day for signs of infection.  Get help right away if you have active bleeding, chest pain, or trouble breathing. This information is not intended to replace advice given to you by your health care provider. Make sure you discuss any questions you have with your health care provider. Document Revised: 01/08/2017 Document Reviewed: 08/06/2016 Elsevier Patient Education  2020 Elsevier Inc.  

## 2019-07-25 ENCOUNTER — Observation Stay (HOSPITAL_COMMUNITY): Payer: Medicare Other

## 2019-07-25 ENCOUNTER — Emergency Department (HOSPITAL_COMMUNITY): Payer: Medicare Other

## 2019-07-25 ENCOUNTER — Encounter (HOSPITAL_COMMUNITY): Payer: Self-pay

## 2019-07-25 ENCOUNTER — Inpatient Hospital Stay (HOSPITAL_COMMUNITY)
Admission: EM | Admit: 2019-07-25 | Discharge: 2019-08-01 | DRG: 871 | Disposition: A | Discharge status: Skilled Nursing Facility | Payer: Medicare Other | Attending: Internal Medicine | Admitting: Internal Medicine

## 2019-07-25 ENCOUNTER — Other Ambulatory Visit: Payer: Self-pay

## 2019-07-25 DIAGNOSIS — F410 Panic disorder [episodic paroxysmal anxiety] without agoraphobia: Secondary | ICD-10-CM | POA: Diagnosis present

## 2019-07-25 DIAGNOSIS — I361 Nonrheumatic tricuspid (valve) insufficiency: Secondary | ICD-10-CM | POA: Diagnosis not present

## 2019-07-25 DIAGNOSIS — S81012A Laceration without foreign body, left knee, initial encounter: Secondary | ICD-10-CM | POA: Diagnosis present

## 2019-07-25 DIAGNOSIS — Z888 Allergy status to other drugs, medicaments and biological substances status: Secondary | ICD-10-CM

## 2019-07-25 DIAGNOSIS — E1165 Type 2 diabetes mellitus with hyperglycemia: Secondary | ICD-10-CM | POA: Diagnosis present

## 2019-07-25 DIAGNOSIS — J690 Pneumonitis due to inhalation of food and vomit: Secondary | ICD-10-CM | POA: Diagnosis present

## 2019-07-25 DIAGNOSIS — Z885 Allergy status to narcotic agent status: Secondary | ICD-10-CM

## 2019-07-25 DIAGNOSIS — N390 Urinary tract infection, site not specified: Secondary | ICD-10-CM | POA: Diagnosis present

## 2019-07-25 DIAGNOSIS — R339 Retention of urine, unspecified: Secondary | ICD-10-CM | POA: Diagnosis present

## 2019-07-25 DIAGNOSIS — R778 Other specified abnormalities of plasma proteins: Secondary | ICD-10-CM | POA: Diagnosis present

## 2019-07-25 DIAGNOSIS — E860 Dehydration: Secondary | ICD-10-CM | POA: Diagnosis present

## 2019-07-25 DIAGNOSIS — R471 Dysarthria and anarthria: Secondary | ICD-10-CM | POA: Diagnosis present

## 2019-07-25 DIAGNOSIS — M25569 Pain in unspecified knee: Secondary | ICD-10-CM

## 2019-07-25 DIAGNOSIS — Z515 Encounter for palliative care: Secondary | ICD-10-CM | POA: Diagnosis not present

## 2019-07-25 DIAGNOSIS — I639 Cerebral infarction, unspecified: Secondary | ICD-10-CM | POA: Diagnosis present

## 2019-07-25 DIAGNOSIS — W109XXA Fall (on) (from) unspecified stairs and steps, initial encounter: Secondary | ICD-10-CM | POA: Diagnosis present

## 2019-07-25 DIAGNOSIS — Z66 Do not resuscitate: Secondary | ICD-10-CM | POA: Diagnosis not present

## 2019-07-25 DIAGNOSIS — I2699 Other pulmonary embolism without acute cor pulmonale: Secondary | ICD-10-CM | POA: Diagnosis present

## 2019-07-25 DIAGNOSIS — K219 Gastro-esophageal reflux disease without esophagitis: Secondary | ICD-10-CM | POA: Diagnosis present

## 2019-07-25 DIAGNOSIS — E119 Type 2 diabetes mellitus without complications: Secondary | ICD-10-CM

## 2019-07-25 DIAGNOSIS — F0390 Unspecified dementia without behavioral disturbance: Secondary | ICD-10-CM | POA: Diagnosis present

## 2019-07-25 DIAGNOSIS — Z79899 Other long term (current) drug therapy: Secondary | ICD-10-CM

## 2019-07-25 DIAGNOSIS — I10 Essential (primary) hypertension: Secondary | ICD-10-CM | POA: Diagnosis present

## 2019-07-25 DIAGNOSIS — Z781 Physical restraint status: Secondary | ICD-10-CM

## 2019-07-25 DIAGNOSIS — W19XXXA Unspecified fall, initial encounter: Secondary | ICD-10-CM | POA: Diagnosis not present

## 2019-07-25 DIAGNOSIS — R269 Unspecified abnormalities of gait and mobility: Secondary | ICD-10-CM | POA: Diagnosis present

## 2019-07-25 DIAGNOSIS — Z90710 Acquired absence of both cervix and uterus: Secondary | ICD-10-CM

## 2019-07-25 DIAGNOSIS — F05 Delirium due to known physiological condition: Secondary | ICD-10-CM | POA: Diagnosis present

## 2019-07-25 DIAGNOSIS — R1310 Dysphagia, unspecified: Secondary | ICD-10-CM | POA: Diagnosis present

## 2019-07-25 DIAGNOSIS — Z6831 Body mass index (BMI) 31.0-31.9, adult: Secondary | ICD-10-CM

## 2019-07-25 DIAGNOSIS — E669 Obesity, unspecified: Secondary | ICD-10-CM | POA: Diagnosis present

## 2019-07-25 DIAGNOSIS — R4182 Altered mental status, unspecified: Secondary | ICD-10-CM

## 2019-07-25 DIAGNOSIS — Z86718 Personal history of other venous thrombosis and embolism: Secondary | ICD-10-CM

## 2019-07-25 DIAGNOSIS — H548 Legal blindness, as defined in USA: Secondary | ICD-10-CM | POA: Diagnosis present

## 2019-07-25 DIAGNOSIS — E785 Hyperlipidemia, unspecified: Secondary | ICD-10-CM | POA: Diagnosis present

## 2019-07-25 DIAGNOSIS — Z9689 Presence of other specified functional implants: Secondary | ICD-10-CM | POA: Diagnosis present

## 2019-07-25 DIAGNOSIS — D638 Anemia in other chronic diseases classified elsewhere: Secondary | ICD-10-CM | POA: Diagnosis present

## 2019-07-25 DIAGNOSIS — Z7189 Other specified counseling: Secondary | ICD-10-CM

## 2019-07-25 DIAGNOSIS — B9620 Unspecified Escherichia coli [E. coli] as the cause of diseases classified elsewhere: Secondary | ICD-10-CM | POA: Diagnosis present

## 2019-07-25 DIAGNOSIS — Z79891 Long term (current) use of opiate analgesic: Secondary | ICD-10-CM

## 2019-07-25 DIAGNOSIS — Z9049 Acquired absence of other specified parts of digestive tract: Secondary | ICD-10-CM

## 2019-07-25 DIAGNOSIS — Z7901 Long term (current) use of anticoagulants: Secondary | ICD-10-CM

## 2019-07-25 DIAGNOSIS — G9341 Metabolic encephalopathy: Secondary | ICD-10-CM | POA: Diagnosis present

## 2019-07-25 DIAGNOSIS — Z20822 Contact with and (suspected) exposure to covid-19: Secondary | ICD-10-CM | POA: Diagnosis present

## 2019-07-25 DIAGNOSIS — Z8719 Personal history of other diseases of the digestive system: Secondary | ICD-10-CM

## 2019-07-25 DIAGNOSIS — W19XXXD Unspecified fall, subsequent encounter: Secondary | ICD-10-CM | POA: Diagnosis not present

## 2019-07-25 DIAGNOSIS — A419 Sepsis, unspecified organism: Principal | ICD-10-CM | POA: Diagnosis present

## 2019-07-25 DIAGNOSIS — Z7984 Long term (current) use of oral hypoglycemic drugs: Secondary | ICD-10-CM

## 2019-07-25 DIAGNOSIS — Z87440 Personal history of urinary (tract) infections: Secondary | ICD-10-CM

## 2019-07-25 DIAGNOSIS — R400 Somnolence: Secondary | ICD-10-CM | POA: Diagnosis not present

## 2019-07-25 DIAGNOSIS — E1100 Type 2 diabetes mellitus with hyperosmolarity without nonketotic hyperglycemic-hyperosmolar coma (NKHHC): Secondary | ICD-10-CM | POA: Diagnosis not present

## 2019-07-25 DIAGNOSIS — Z8249 Family history of ischemic heart disease and other diseases of the circulatory system: Secondary | ICD-10-CM

## 2019-07-25 DIAGNOSIS — Z86711 Personal history of pulmonary embolism: Secondary | ICD-10-CM | POA: Diagnosis not present

## 2019-07-25 LAB — CBC
HCT: 39.3 % (ref 36.0–46.0)
Hemoglobin: 11.9 g/dL — ABNORMAL LOW (ref 12.0–15.0)
MCH: 27.4 pg (ref 26.0–34.0)
MCHC: 30.3 g/dL (ref 30.0–36.0)
MCV: 90.3 fL (ref 80.0–100.0)
Platelets: 209 10*3/uL (ref 150–400)
RBC: 4.35 MIL/uL (ref 3.87–5.11)
RDW: 21.7 % — ABNORMAL HIGH (ref 11.5–15.5)
WBC: 10 10*3/uL (ref 4.0–10.5)
nRBC: 0 % (ref 0.0–0.2)

## 2019-07-25 LAB — COMPREHENSIVE METABOLIC PANEL
ALT: 17 U/L (ref 0–44)
AST: 28 U/L (ref 15–41)
Albumin: 4.3 g/dL (ref 3.5–5.0)
Alkaline Phosphatase: 44 U/L (ref 38–126)
Anion gap: 14 (ref 5–15)
BUN: 33 mg/dL — ABNORMAL HIGH (ref 8–23)
CO2: 25 mmol/L (ref 22–32)
Calcium: 9.6 mg/dL (ref 8.9–10.3)
Chloride: 101 mmol/L (ref 98–111)
Creatinine, Ser: 1.33 mg/dL — ABNORMAL HIGH (ref 0.44–1.00)
GFR calc Af Amer: 43 mL/min — ABNORMAL LOW (ref >60)
GFR calc non Af Amer: 37 mL/min — ABNORMAL LOW (ref >60)
Glucose, Bld: 170 mg/dL — ABNORMAL HIGH (ref 70–99)
Potassium: 4 mmol/L (ref 3.5–5.1)
Sodium: 140 mmol/L (ref 135–145)
Total Bilirubin: 0.5 mg/dL (ref 0.3–1.2)
Total Protein: 7.4 g/dL (ref 6.5–8.1)

## 2019-07-25 LAB — URINALYSIS, ROUTINE W REFLEX MICROSCOPIC
Bilirubin Urine: NEGATIVE
Glucose, UA: NEGATIVE mg/dL
Ketones, ur: NEGATIVE mg/dL
Leukocytes,Ua: NEGATIVE
Nitrite: POSITIVE — AB
Protein, ur: NEGATIVE mg/dL
Specific Gravity, Urine: 1.041 — ABNORMAL HIGH (ref 1.005–1.030)
pH: 5 (ref 5.0–8.0)

## 2019-07-25 LAB — DIFFERENTIAL
Abs Immature Granulocytes: 0.05 10*3/uL (ref 0.00–0.07)
Basophils Absolute: 0 10*3/uL (ref 0.0–0.1)
Basophils Relative: 0 %
Eosinophils Absolute: 0.1 10*3/uL (ref 0.0–0.5)
Eosinophils Relative: 1 %
Immature Granulocytes: 1 %
Lymphocytes Relative: 18 %
Lymphs Abs: 1.8 10*3/uL (ref 0.7–4.0)
Monocytes Absolute: 0.7 10*3/uL (ref 0.1–1.0)
Monocytes Relative: 7 %
Neutro Abs: 7.4 10*3/uL (ref 1.7–7.7)
Neutrophils Relative %: 73 %

## 2019-07-25 LAB — I-STAT CHEM 8, ED
BUN: 30 mg/dL — ABNORMAL HIGH (ref 8–23)
Calcium, Ion: 1.23 mmol/L (ref 1.15–1.40)
Chloride: 103 mmol/L (ref 98–111)
Creatinine, Ser: 1.4 mg/dL — ABNORMAL HIGH (ref 0.44–1.00)
Glucose, Bld: 168 mg/dL — ABNORMAL HIGH (ref 70–99)
HCT: 39 % (ref 36.0–46.0)
Hemoglobin: 13.3 g/dL (ref 12.0–15.0)
Potassium: 4.3 mmol/L (ref 3.5–5.1)
Sodium: 141 mmol/L (ref 135–145)
TCO2: 28 mmol/L (ref 22–32)

## 2019-07-25 LAB — APTT: aPTT: 30 seconds (ref 24–36)

## 2019-07-25 LAB — TROPONIN I (HIGH SENSITIVITY)
Troponin I (High Sensitivity): 274 ng/L (ref <18)
Troponin I (High Sensitivity): 736 ng/L (ref <18)

## 2019-07-25 LAB — CBG MONITORING, ED: Glucose-Capillary: 136 mg/dL — ABNORMAL HIGH (ref 70–99)

## 2019-07-25 LAB — RAPID URINE DRUG SCREEN, HOSP PERFORMED
Amphetamines: NOT DETECTED
Barbiturates: NOT DETECTED
Benzodiazepines: NOT DETECTED
Cocaine: NOT DETECTED
Opiates: NOT DETECTED
Tetrahydrocannabinol: NOT DETECTED

## 2019-07-25 LAB — ETHANOL: Alcohol, Ethyl (B): <10 mg/dL (ref <10)

## 2019-07-25 LAB — PROTIME-INR
INR: 1.4 — ABNORMAL HIGH (ref 0.8–1.2)
Prothrombin Time: 16.6 seconds — ABNORMAL HIGH (ref 11.4–15.2)

## 2019-07-25 LAB — LACTIC ACID, PLASMA: Lactic Acid, Venous: 1.9 mmol/L (ref 0.5–1.9)

## 2019-07-25 MED ORDER — LABETALOL HCL 5 MG/ML IV SOLN: 10.0000 mg | INTRAVENOUS | Status: DC | PRN

## 2019-07-25 MED ORDER — HALOPERIDOL LACTATE 5 MG/ML IJ SOLN
5.0000 mg | Freq: Once | INTRAMUSCULAR | Status: AC
  Administered 2019-07-25: 5 mg via INTRAVENOUS
  Filled 2019-07-25: qty 1

## 2019-07-25 MED ORDER — IOHEXOL 350 MG/ML SOLN
100.0000 mL | Freq: Once | INTRAVENOUS | Status: AC | PRN
  Administered 2019-07-25: 100 mL via INTRAVENOUS

## 2019-07-25 MED ORDER — SODIUM CHLORIDE 0.9 % IV BOLUS
1000.0000 mL | Freq: Once | INTRAVENOUS | Status: AC
  Administered 2019-07-25: 1000 mL via INTRAVENOUS

## 2019-07-25 MED ORDER — ONDANSETRON HCL 4 MG PO TABS: 4.0000 mg | ORAL_TABLET | Freq: Four times a day (QID) | ORAL | Status: DC | PRN

## 2019-07-25 MED ORDER — INSULIN ASPART 100 UNIT/ML ~~LOC~~ SOLN
0.0000 [IU] | Freq: Three times a day (TID) | SUBCUTANEOUS | Status: DC
  Administered 2019-07-26 – 2019-07-28 (×6): 1 [IU] via SUBCUTANEOUS
  Administered 2019-07-29 (×3): 2 [IU] via SUBCUTANEOUS
  Administered 2019-07-30: 1 [IU] via SUBCUTANEOUS
  Administered 2019-07-31: 2 [IU] via SUBCUTANEOUS
  Administered 2019-07-31: 1 [IU] via SUBCUTANEOUS
  Administered 2019-07-31 – 2019-08-01 (×3): 2 [IU] via SUBCUTANEOUS

## 2019-07-25 MED ORDER — ROSUVASTATIN CALCIUM 10 MG PO TABS
5.0000 mg | ORAL_TABLET | Freq: Every day | ORAL | Status: DC
  Administered 2019-07-27 – 2019-08-01 (×5): 5 mg via ORAL
  Filled 2019-07-25 (×10): qty 1

## 2019-07-25 MED ORDER — PANTOPRAZOLE SODIUM 40 MG PO TBEC
40.0000 mg | DELAYED_RELEASE_TABLET | Freq: Every day | ORAL | Status: DC
  Administered 2019-07-27 – 2019-08-01 (×5): 40 mg via ORAL
  Filled 2019-07-25 (×7): qty 1

## 2019-07-25 MED ORDER — ACETAMINOPHEN 325 MG PO TABS
650.0000 mg | ORAL_TABLET | Freq: Four times a day (QID) | ORAL | Status: DC | PRN
  Administered 2019-07-26 – 2019-08-01 (×4): 650 mg via ORAL
  Filled 2019-07-25 (×5): qty 2

## 2019-07-25 MED ORDER — LORAZEPAM 2 MG/ML IJ SOLN
INTRAMUSCULAR | Status: AC
  Administered 2019-07-25: 0.5 mg
  Administered 2019-07-25: 1 mg
  Filled 2019-07-25: qty 1

## 2019-07-25 MED ORDER — ASPIRIN 300 MG RE SUPP
300.0000 mg | Freq: Once | RECTAL | Status: AC
  Administered 2019-07-25: 300 mg via RECTAL
  Filled 2019-07-25: qty 1

## 2019-07-25 MED ORDER — SODIUM CHLORIDE 0.9 % IV SOLN: INTRAVENOUS | Status: AC

## 2019-07-25 MED ORDER — ACETAMINOPHEN 650 MG RE SUPP
650.0000 mg | Freq: Four times a day (QID) | RECTAL | Status: DC | PRN
  Administered 2019-07-26: 650 mg via RECTAL
  Filled 2019-07-25: qty 1

## 2019-07-25 MED ORDER — SODIUM CHLORIDE 0.9 % IV SOLN
1.0000 g | INTRAVENOUS | Status: AC
  Administered 2019-07-25 – 2019-07-27 (×3): 1 g via INTRAVENOUS
  Filled 2019-07-25 (×4): qty 10

## 2019-07-25 MED ORDER — ONDANSETRON HCL 4 MG/2ML IJ SOLN: 4.0000 mg | Freq: Four times a day (QID) | INTRAMUSCULAR | Status: DC | PRN

## 2019-07-25 MED ORDER — HALOPERIDOL LACTATE 5 MG/ML IJ SOLN
5.0000 mg | Freq: Four times a day (QID) | INTRAMUSCULAR | Status: DC | PRN
  Administered 2019-07-25 – 2019-07-26 (×3): 5 mg via INTRAMUSCULAR
  Filled 2019-07-25 (×3): qty 1

## 2019-07-25 NOTE — ED Triage Notes (Signed)
EMS reports daughter says she left pt's house at 1430 and pt was sitting outside.  Reports pt usually alert and oriented.  Daughter came back around 67 and found pt at the bottom of 3 brick steps on a cement carport.  When ems arrived pt was already sitting up in a chair and pt was confused and had slurred speech.  Reports pt was "rambling" agitated, and hard to understand.  Reports pt wasn't able to follow commands.  PT on eliquis.  CBG 123, sinus on monitor, bp normal per ems.  EMS says initially when she palpated chest she was concerned about hearing subcutaneous air but says then went away.  Reports lungs sounds were clear and present.

## 2019-07-25 NOTE — ED Notes (Signed)
Neuro consult complete.

## 2019-07-25 NOTE — ED Notes (Signed)
Date and time results received: 07/25/19 ".now" (use smartphrase ".now" to insert current time)  Test: Tropinin Critical Value: 736  Name of Provider Notified: E. Emokpae  Orders Received? Or Actions Taken?: None yet

## 2019-07-25 NOTE — ED Notes (Signed)
Date and time results received: 07/25/19 1855  Test: Troponin  Critical Value: 274  Name of Provider Notified: Dr. Roderic Palau  Orders Received? Or Actions Taken?: EDP notified

## 2019-07-25 NOTE — ED Triage Notes (Signed)
Pt brought to ED via [facility] for slurred speech and unable to follow commands. LKW 1430

## 2019-07-25 NOTE — ED Provider Notes (Signed)
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 Final diagnoses:  Fall, initial encounter    Rx / DC Orders ED Discharge Orders    None       Milton Ferguson, MD 07/25/19 2000

## 2019-07-25 NOTE — Consult Note (Addendum)
[facility] Tele[facility]urology Consult Services   Date of Service:   07/25/2019 16:38:46  Impression:     .  G93.49 - Encephalopathy Multifactorial  Comments/Sign-Out: Brianna Burns is an 84 y.o. 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 Exam was initially reportedly significant for confusion and dysarthria. She was also markedly agitated. She was determined to not be an Alteplase candidate as she took Apixaban earlier this morning at 1000. Formal neurologic evaluation was delayed as she required sedation to get both the CT non-con and vessel imaging. Eventually CT imaging was obtained and showed no acute intracranial process. Per my review of her vessel and perfusion imaging, I see no LVO, significant vascular pathology, or perfusion deficit. At this time is is possible that she suffered a small ischemic stroke affecting the corticobulbar and corticospinal tracts causing her to fall. She may have also just had a mechanical fall that resulted in head trauma and made her confused 2/2 concussion. There was also some concern for hypoglycemia on seen, however her blood sugars appear to have normalized without intervention. By the time of formal Neurologic exam she had become somnolent from the medications (Ativan 2mg  IV and 5mg  of Haldol). On my evaluation she was not speaking at all and was markedly inattentive, being very hard to arouse. She would move all 4 extremities with noxious stimulation and localize well. There was maybe some subtle left lower facial droop, but otherwise her exam was non-focal. I do not believe she had a seizure. At this time I recommend admission for further evaluation and treatment of her encephalopathy. Labs show a mildly elevated creatinine and hyperglycemia. She also has a mildly elevated PT and INR consistent with  her taking Apixaban recently. I spoke to Dr. Roderic Palau of the ER about my assessment and recommendations who reported understanding.  Metrics: Last Known Well: 07/25/2019 14:30:00 [facility] Notification Time: 07/25/2019 16:38:46 Arrival Time: 07/25/2019 16:40:00 Stamp Time: 07/25/2019 16:38:46 Time First Login Attempt: 07/25/2019 16:44:51 Symptoms: confusion and dysarthria NIHSS Start Assessment Time: 07/25/2019 17:36:53 Patient is not a candidate for Alteplase/Activase. Alteplase Medical Decision: 07/25/2019 16:51:15 Patient was not deemed candidate for Alteplase/Activase thrombolytics because of following reasons: Use of NOAs within 48 hours.  CT head showed no acute hemorrhage or acute core infarct.  Radiologist was not called back for review of advanced imaging because scans personally reviewed, will also follow up the final reports. ED Physician notified of diagnostic impression and management plan on 07/25/2019 18:00:00  Advanced Imaging: CTA Head and Neck Completed.  CTP Completed.  LVO:No   Our recommendations are outlined below.  Recommendations:     .  Activate Stroke Protocol Admission/Order Set     .  Stroke/Telemetry Floor     .  Neuro Checks q4h     .  Bedside Swallow Eval     .  DVT Prophylaxis     .  IV Fluids, Normal Saline     .  Head of Bed 30 Degrees     .  Euglycemia and Avoid Hyperthermia (PRN Acetaminophen)     .  Recommend routine MRI Brain WO to evaluative for stroke     .  Further stroke work-up per follow up local Neurology consultation     .  Recommend holding further sedating medications as able     .  No need  for seizure medications at this time     .  Recommend holding Apixaban at this time and would just do Aspirin 325mg  PO daily pending SLP eval or can do 300mg  suppository, also only as long as safe from a medical perspective given recent trauma     .  Follow up Neurology can determine when it is safe to resume her home Apixaban pending  MRI Brain results     .  Recommend continuing home statin pending SLP eval     .  Recommend permissive HTN up to 220/120 for now, if >17OHYW change in systolic BP or >73XTGG change in diastolic BP please ensure nursing notifies the primary team and then Neurology if change in exam or other concern by primary team     .  Infectious work-up per ER/primary team     .  Trauma work-up per ER/primary team     .  Alert Neurology immediately with any neuro-worsening  Routine Consultation with [facility] for Follow up Care  Sign Out:     .  Discussed with Emergency Department Provider    ------------------------------------------------------------------------------  History of Present Illness: Patient is a 85 year old Female.  Patient was brought by EMS for symptoms of confusion and dysarthria  Brianna Burns is an 85 y.o. 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 The patient's daughter Venida Jarvis) provides most of the history. Her mother was doing well today and was LKW at around 1400 today. Sherri reports that her mother was then found down on the floor of her car port at her home by a neighbor at around 1528. She usually does not walk out that way, but Venida Jarvis thinks that she fell down a few steps and hit her head. It is unclear if she lost consciousness, but she was very confused and had slurring of her speech when her daughter went back after being contacted by her neighbor. There were no convulsions noted by Sherri. Initial EMS FSG was 53, and on repeat here in the ER it is normal. It is not clear to me if they gave her any D50 on the way to the ER. Per Venida Jarvis, her mother does take Apixaban and last took this medication at about 1000 this morning. Her other medications include metformin, Vitamin B12, losartan, Vitamin D3, and she has been taking  Imodium due to diarrhea today. She also takes omeprazole, metoprolol, zoloft, hydroxyzine PRN, imipramine, and recently started iron supplementation. At baseline, per her daughter, she is usually completely oriented to person and location. She walks with a walker, but is independent in her ADLs except with bathing as her daughter is nervous she will fall. She is legally blind at baseline. She previously underwent a right knee surgery on the 11th of November 2694 which was complicated by a UTI from her indwelling foley and she required a prolonged period of rehab (for 21 days) after this hospitalization. Once the patient arrived on camera she was unable to provide a history due to the sedation she had just received. Per ER team report she was dysarthric and markedly agitated before and was complaining of knee pain before the medications took their effect.    Past Medical History:     . Hypertension     . Diabetes Mellitus     . Hyperlipidemia     .  There is NO history of Atrial Fibrillation     . There is NO history of Coronary Artery Disease     . There is NO history of Stroke  Anticoagulant use:  Apixaban  Antiplatelet use: No  NIHSS may not be reliable due to: patient required sedation for CT scans, afterward  Examination: BP(111/58), Pulse(90), Blood Glucose(136) 1A: Level of Consciousness - Movements to Pain + 2 1B: Ask Month and Age - Could Not Answer Either Question Correctly + 2 1C: Blink Eyes & Squeeze Hands - Performs 0 Tasks + 2 2: Test Horizontal Extraocular Movements - Normal + 0 3: Test Visual Fields - No Visual Loss + 0 4: Test Facial Palsy (Use Grimace if Obtunded) - Minor paralysis (flat nasolabial fold, smile asymmetry) + 1 5A: Test Left Arm Motor Drift - Drift, hits bed + 2 5B: Test Right Arm Motor Drift - Drift, hits bed + 2 6A: Test Left Leg Motor Drift - No Effort Against Gravity + 3 6B: Test Right Leg Motor Drift - No Effort Against Gravity + 3 7: Test Limb Ataxia  (FNF/Heel-Shin) - No Ataxia + 0 8: Test Sensation - Normal; No sensory loss + 0 9: Test Language/Aphasia - Mute/Global Aphasia: No Usable Speech/Auditory Comprehension + 3 10: Test Dysarthria - Mute/Anarthric + 2 11: Test Extinction/Inattention - No abnormality + 0  NIHSS Score: 22   Exam confounded by recent sedation.    Markedly somnolent, requires repeated stimulation to awaken and then drifts back off to sleep almost immediately.  Inattentive.  Does not speak or follow any commands.       L eye horizontal EOMI without nystagmus.  R eye is a prosthetic.  L eye pupil reactive to light.  No clear VF deficit.   There is subtle left lower facial weakness. Unable to reliably assess facial sensation and for dysarthria.     Anti-gravity in b/l UE's, but drift to the bed, localizes in the b/l UE's.  Withdrawls b/l LE's along the bed.   Intact sensation to noxious stimulation in all 4 extremities.     Pre-Morbid Modified Ranking Scale: 2 Points = Slight disability; unable to carry out all previous activities, but able to look after own affairs without assistance   Patient/Family was informed the Neurology Consult would occur via TeleHealth consult by way of interactive audio and video telecommunications and consented to receiving care in this manner.   Patient is being evaluated for possible acute neurologic impairment and high probability of imminent or life-threatening deterioration. I spent total of 75 minutes providing care to this patient, including time for face to face visit via telemedicine, review of medical records, imaging studies and discussion of findings with providers, the patient and/or family.   Dr Kathee Delton   [facility] [phone]  Case [id number]   Addendum:  I followed up the final CT Head, CTA head and neck, and CTP Brain reports.  No acute vascular pathology, LVO, or perfusion deficits.  Recommendations as before.    Kathee Delton, MD   CT Head  07/25/2019 IMPRESSION: 1. No acute finding by CT. Atrophy and chronic small-vessel ischemic changes of the white matter. 2. ASPECTS is 10 CTA Head and neck 07/25/2019 No large vessel occlusion.  Negative perfusion study.   No carotid or vertebral stenosis. Tortuous vessels suggesting hypertension.   CTP Brain 07/25/2019 No large vessel occlusion.  Negative perfusion study.   No carotid or vertebral stenosis. Tortuous vessels suggesting hypertension.
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
## 2019-07-25 NOTE — Progress Notes (Signed)
Code stroke documentation  Call time  15:30 Beeper time  16:37 Exam started 1700 Exam finished  1702 Images sent to [facility]  1703 Exam completed in Epic  1703 [place] called  1703

## 2019-07-26 ENCOUNTER — Inpatient Hospital Stay (HOSPITAL_COMMUNITY): Payer: Medicare Other

## 2019-07-26 ENCOUNTER — Encounter (HOSPITAL_COMMUNITY): Payer: Self-pay | Admitting: Internal Medicine

## 2019-07-26 DIAGNOSIS — E785 Hyperlipidemia, unspecified: Secondary | ICD-10-CM

## 2019-07-26 DIAGNOSIS — R778 Other specified abnormalities of plasma proteins: Secondary | ICD-10-CM

## 2019-07-26 DIAGNOSIS — I361 Nonrheumatic tricuspid (valve) insufficiency: Secondary | ICD-10-CM

## 2019-07-26 DIAGNOSIS — I10 Essential (primary) hypertension: Secondary | ICD-10-CM

## 2019-07-26 DIAGNOSIS — Z86711 Personal history of pulmonary embolism: Secondary | ICD-10-CM

## 2019-07-26 DIAGNOSIS — I2699 Other pulmonary embolism without acute cor pulmonale: Secondary | ICD-10-CM

## 2019-07-26 DIAGNOSIS — R400 Somnolence: Secondary | ICD-10-CM

## 2019-07-26 DIAGNOSIS — E1165 Type 2 diabetes mellitus with hyperglycemia: Secondary | ICD-10-CM

## 2019-07-26 LAB — TROPONIN I (HIGH SENSITIVITY)
Troponin I (High Sensitivity): 963 ng/L
Troponin I (High Sensitivity): 632 ng/L

## 2019-07-26 LAB — ECHOCARDIOGRAM COMPLETE
Height: 62 in
Weight: 2776.03 [oz_av]

## 2019-07-26 LAB — BASIC METABOLIC PANEL WITH GFR
Anion gap: 10 (ref 5–15)
BUN: 23 mg/dL (ref 8–23)
CO2: 25 mmol/L (ref 22–32)
Calcium: 8.8 mg/dL — ABNORMAL LOW (ref 8.9–10.3)
Chloride: 105 mmol/L (ref 98–111)
Creatinine, Ser: 1.06 mg/dL — ABNORMAL HIGH (ref 0.44–1.00)
GFR calc Af Amer: 57 mL/min — ABNORMAL LOW
GFR calc non Af Amer: 49 mL/min — ABNORMAL LOW
Glucose, Bld: 168 mg/dL — ABNORMAL HIGH (ref 70–99)
Potassium: 4.3 mmol/L (ref 3.5–5.1)
Sodium: 140 mmol/L (ref 135–145)

## 2019-07-26 LAB — CBC
HCT: 34.5 % — ABNORMAL LOW (ref 36.0–46.0)
Hemoglobin: 10.4 g/dL — ABNORMAL LOW (ref 12.0–15.0)
MCH: 27.2 pg (ref 26.0–34.0)
MCHC: 30.1 g/dL (ref 30.0–36.0)
MCV: 90.3 fL (ref 80.0–100.0)
Platelets: 181 10*3/uL (ref 150–400)
RBC: 3.82 MIL/uL — ABNORMAL LOW (ref 3.87–5.11)
RDW: 21.5 % — ABNORMAL HIGH (ref 11.5–15.5)
WBC: 10.8 10*3/uL — ABNORMAL HIGH (ref 4.0–10.5)
nRBC: 0 % (ref 0.0–0.2)

## 2019-07-26 LAB — GLUCOSE, CAPILLARY
Glucose-Capillary: 150 mg/dL — ABNORMAL HIGH (ref 70–99)
Glucose-Capillary: 138 mg/dL — ABNORMAL HIGH (ref 70–99)

## 2019-07-26 LAB — SARS CORONAVIRUS 2 BY RT PCR (HOSPITAL ORDER, PERFORMED IN ~~LOC~~ HOSPITAL LAB): SARS Coronavirus 2: NEGATIVE

## 2019-07-26 MED ORDER — APIXABAN 5 MG PO TABS
5.0000 mg | ORAL_TABLET | Freq: Two times a day (BID) | ORAL | Status: DC
  Administered 2019-07-26 – 2019-08-01 (×12): 5 mg via ORAL
  Filled 2019-07-26 (×8): qty 1
  Filled 2019-07-26: qty 2
  Filled 2019-07-26 (×3): qty 1

## 2019-07-26 MED ORDER — LORAZEPAM 2 MG/ML IJ SOLN
0.5000 mg | Freq: Once | INTRAMUSCULAR | Status: AC
  Filled 2019-07-26: qty 1

## 2019-07-26 MED ORDER — SODIUM CHLORIDE 0.9 % IV SOLN: INTRAVENOUS | Status: DC

## 2019-07-26 MED ORDER — ASPIRIN 81 MG PO CHEW
81.0000 mg | CHEWABLE_TABLET | Freq: Every day | ORAL | Status: DC
  Administered 2019-07-26 – 2019-08-01 (×7): 81 mg via ORAL
  Filled 2019-07-26 (×7): qty 1

## 2019-07-26 NOTE — Progress Notes (Signed)
Daughter was able to feed patient at least one chocolate ice cream serving.  Also, did swallow crushed tylenol in bite of applesauce.

## 2019-07-26 NOTE — TOC Initial Note (Signed)
Transition of Care [facility]) - Initial/Assessment Note    Patient Details  Name: Brianna Burns MRN: [id number] Date of Birth: February 26, 1934  Transition of Care [facility]) CM/SW Contact:    Ihor Gully, LCSW Phone Number: 07/26/2019, 12:45 PM  Clinical Narrative:                 Daughter, Ernestene Mention, provided hx. Patient was in rehab at [facility] in December due to a broken knee. Patient lives at home with her husband. Daughter, Venida Jarvis, lives two houses down. Patient's son is coming in town today. At baseline, patient ambulates with a walker. Yesterday, she walked to the mailbox using her walker and walked up steps using the rail. Patient fell walking the steps as she was entering a different door. Patient has a shower chair, handles in her bathtub, bedside commode, lift chair. She sleeps in a lift chair as sleeping in the bed causes her leg to become stiff. Patient dresses herself, takes sink baths, changes clothes.  PT recommendations of SNF were discussed. Ms. Evalee Jefferson states that they will consider SNF again. Referral sent to requested facility.   Expected Discharge Plan: Skilled Nursing Facility Barriers to Discharge: Continued Medical Work up   Patient Goals and CMS Choice     Choice offered to / list presented to : Adult Children  Expected Discharge Plan and Services Expected Discharge Plan: [place] Acute Care Choice: [place] Living arrangements for the past 2 months: Single Family Home                                      Prior Living Arrangements/Services Living arrangements for the past 2 months: Single Family Home Lives with:: Spouse Patient language and need for interpreter reviewed:: Yes Do you feel safe going back to the place where you live?: Yes      Need for Family Participation in Patient Care: Yes (Comment) Care giver support system in place?: Yes (comment) Current home services: DME Criminal  Activity/Legal Involvement Pertinent to Current Situation/Hospitalization: No - Comment as needed  Activities of Daily Living Home Assistive Devices/Equipment: Bedside commode/3-in-1, Grab bars in shower, Shower chair without back, Walker (specify type) ADL Screening (condition at time of admission) Patient's cognitive ability adequate to safely complete daily activities?: No Is the patient deaf or have difficulty hearing?: No Does the patient have difficulty seeing, even when wearing glasses/contacts?: Yes Does the patient have difficulty concentrating, remembering, or making decisions?: Yes Patient able to express need for assistance with ADLs?: No Does the patient have difficulty dressing or bathing?: Yes Independently performs ADLs?: No Communication: Needs assistance Is this a change from baseline?: Change from baseline, expected to last <3 days Dressing (OT): Needs assistance Is this a change from baseline?: Change from baseline, expected to last <3days Grooming: Needs assistance Is this a change from baseline?: Change from baseline, expected to last <3 days Feeding: Needs assistance Is this a change from baseline?: Change from baseline, expected to last <3 days Bathing: Needs assistance Is this a change from baseline?: Change from baseline, expected to last >3 days Toileting: Needs assistance Is this a change from baseline?: Change from baseline, expected to last <3 days In/Out Bed: Needs assistance Is this a change from baseline?: Change from baseline, expected to last <3 days Walks in Home: Independent with device (comment) Does the patient have difficulty  walking or climbing stairs?: Yes Weakness of Legs: Both Weakness of Arms/Hands: None  Permission Sought/Granted Permission sought to share information with : Family Supports    Share Information with NAME: Ernestene Mention     Permission granted to share info w Relationship: daughter     Emotional  Assessment Appearance:: Appears stated age   Affect (typically observed): Appropriate Orientation: : Oriented to Self, Oriented to Situation, Oriented to  Time Alcohol / Substance Use: Not Applicable Psych Involvement: No (comment)  Admission diagnosis:  Fall, initial encounter [W19.XXXA] AMS (altered mental status) [R41.82] Patient Active Problem List   Diagnosis Date Noted  . UTI (urinary tract infection) 12/30/2018  . History of recurrent deep vein thrombosis (DVT) 12/29/2018  . Recurrent pulmonary embolism ([place]) 12/29/2018  . S/P IVC filter 12/29/2018  . Physical deconditioning 12/29/2018  . AMS (altered mental status) 12/24/2018  . E. coli UTI (urinary tract infection) 12/24/2018  . Anemia, chronic disease 12/24/2018  . Displaced fracture of right patella 12/21/2018  . HTN (hypertension) 06/27/2012  . DM (diabetes mellitus), type 2 ([place]), NIDDM 06/27/2012  . GERD (gastroesophageal reflux disease) 06/27/2012  . Nocturia 06/27/2012   PCP:  Christain Sacramento, MD Pharmacy:   [facility] [address] [place] [address] [place] [address] Phone: [phone] Fax: [phone]     Social Determinants of Health (SDOH) Interventions    Readmission Risk Interventions No flowsheet data found.

## 2019-07-26 NOTE — Evaluation (Signed)
Physical Therapy Evaluation Patient Details Name: Brianna Burns MRN: [id number] DOB: April 10, 1934 Today's Date: 07/26/2019   History of Present Illness  Brianna Burns is a 85 y.o. female with medical history significant for hypertension, diabetes mellitus, DVT and recurrent PE.Patient was brought to the ED via EMS with reports of slurred speech and unable to follow directions with confusion.  Patient was last seen normal by her daughter at about 2:30 PM.  Daughter was gone for about 1 to 2 hours and came back and found patient on the ground confused.  Patient's daughter reported that patient speech was both garbled and confused.  This is very different from patient's baseline.  At baseline she ambulates with a walker mostly when outside the home, but holds onto objects/the walls when ambulating inside the house.  She occasionally has some occasional short-term memory loss, but is able to hold a conversation and recognize family.She reports 2 episodes of loose stools today, but this is patient's baseline, and has been attributed to patient's Metformin.  No vomiting, no cough, no difficulty breathing.  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    Clinical Impression  Patient presents confused, impulsive and required repeated verbal/tactile cueing to participate with therapy.  Patient limited to a few unsteady labored side steps at bedside requiring Max assist to prevent falling, once seated in chair became more cooperative and less anxious, on room air with SpO2 at 95-97% - RN notified.  Patient tolerated sitting up in chair for approximately 1 hour before requesting to go back to bed.  Patient will benefit from continued physical therapy in hospital and recommended venue below to increase strength, balance, endurance for safe ADLs and gait.    Follow Up Recommendations SNF;Supervision for mobility/OOB;Supervision/Assistance -  24 hour    Equipment Recommendations  None recommended by PT    Recommendations for Other Services       Precautions / Restrictions Precautions Precautions: Fall Restrictions Weight Bearing Restrictions: No      Mobility  Bed Mobility Overal bed mobility: Needs Assistance Bed Mobility: Supine to Sit;Sit to Supine     Supine to sit: Mod assist;Max assist Sit to supine: Mod assist;Max assist   General bed mobility comments: slow labored movement  Transfers Overall transfer level: Needs assistance   Transfers: Sit to/from Stand;Stand Pivot Transfers Sit to Stand: Mod assist;Max assist Stand pivot transfers: Mod assist;Max assist       General transfer comment: increased time, labored movement  Ambulation/Gait Ambulation/Gait assistance: Max assist Gait Distance (Feet): 3 Feet Assistive device: Rolling walker (2 wheeled) Gait Pattern/deviations: Decreased step length - right;Decreased step length - left;Decreased stride length Gait velocity: slow   General Gait Details: limited to 3-4 very unsteady labored side steps requiring Max asisstance to help move RW and preventing patient from losing balance  Stairs            Wheelchair Mobility    Modified Rankin (Stroke Patients Only)       Balance Overall balance assessment: Needs assistance Sitting-balance support: Feet supported;No upper extremity supported Sitting balance-Leahy Scale: Poor Sitting balance - Comments: fair/poor seated at EOB, frequent leaning to the right Postural control: Right lateral lean Standing balance support: During functional activity;Bilateral upper extremity supported Standing balance-Leahy Scale: Poor Standing balance comment: using RW  Pertinent Vitals/Pain Pain Assessment: Faces Faces Pain Scale: Hurts even more Pain Location: generalized pain all over with pressure, mostly right shoulder, left knee Pain Descriptors / Indicators:  Grimacing;Guarding;Sore Pain Intervention(s): Limited activity within patient's tolerance;Monitored during session;Repositioned    Home Living Family/patient expects to be discharged to:: Private residence Living Arrangements: Spouse/significant other Available Help at Discharge: Family Type of Home: House Home Access: Ramped entrance     Home Layout: One level Home Equipment: Grab bars - tub/shower;Tub bench;Bedside commode;Hand held shower head;Walker - 2 wheels;Wheelchair - manual      Prior Function Level of Independence: Needs assistance   Gait / Transfers Assistance Needed: Supervised short distanced household ambulator using RW  ADL's / Homemaking Assistance Needed: assisted by family        Hand Dominance        Extremity/Trunk Assessment   Upper Extremity Assessment Upper Extremity Assessment: Generalized weakness    Lower Extremity Assessment Lower Extremity Assessment: Generalized weakness    Cervical / Trunk Assessment Cervical / Trunk Assessment: Kyphotic  Communication   Communication: Other (comment) (Patient limited for communication due to confusion)  Cognition Arousal/Alertness: Awake/alert Behavior During Therapy: Restless;Impulsive Overall Cognitive Status: Impaired/Different from baseline Area of Impairment: Orientation;Attention;Following commands                 Orientation Level: Person Current Attention Level: Selective   Following Commands: Follows one step commands with increased time       General Comments: requires repeated verbal/tactile cueing to complete functional tasks      General Comments      Exercises     Assessment/Plan    PT Assessment Patient needs continued PT services  PT Problem List Decreased strength;Decreased activity tolerance;Decreased balance;Decreased mobility       PT Treatment Interventions Gait training;Functional mobility training;Therapeutic activities;Therapeutic exercise;Stair  training;Patient/family education    PT Goals (Current goals can be found in the Care Plan section)  Acute Rehab PT Goals Patient Stated Goal: return home PT Goal Formulation: With patient/family Time For Goal Achievement: 08/09/19 Potential to Achieve Goals: Good    Frequency Min 3X/week   Barriers to discharge        Co-evaluation               AM-PAC PT "6 Clicks" Mobility  Outcome Measure Help needed turning from your back to your side while in a flat bed without using bedrails?: A Lot Help needed moving from lying on your back to sitting on the side of a flat bed without using bedrails?: A Lot Help needed moving to and from a bed to a chair (including a wheelchair)?: A Lot Help needed standing up from a chair using your arms (e.g., wheelchair or bedside chair)?: A Lot Help needed to walk in hospital room?: A Lot Help needed climbing 3-5 steps with a railing? : Total 6 Click Score: 11    End of Session   Activity Tolerance: Patient tolerated treatment well;Patient limited by fatigue Patient left: in chair;with call bell/phone within reach;with chair alarm set;with family/visitor present Nurse Communication: Mobility status PT Visit Diagnosis: Unsteadiness on feet (R26.81);Other abnormalities of gait and mobility (R26.89);Muscle weakness (generalized) (M62.81)    Time: 5621-3086 PT Time Calculation (min) (ACUTE ONLY): 29 min   Charges:   PT Evaluation $PT Eval Moderate Complexity: 1 Mod PT Treatments $Therapeutic Activity: 23-37 mins        3:12 PM, 07/26/19 Lonell Grandchild, MPT Physical Therapist with [facility] [phone]  office [phone] mobile phone

## 2019-07-26 NOTE — Progress Notes (Signed)
*  PRELIMINARY RESULTS* Echocardiogram 2D Echocardiogram has been performed.  Leavy Cella 07/26/2019, 10:29 AM

## 2019-07-26 NOTE — Consult Note (Addendum)
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 evidence of mitral stenosis.  4. The aortic valve is tricuspid. Aortic valve regurgitation is not  visualized. Mild to moderate aortic valve sclerosis/calcification is  present, without any evidence  of aortic stenosis.  5. The inferior vena cava is normal in size with  greater than 50%  respiratory variability, suggesting right atrial pressure of 3 mmHg.           [employer] A. Merlene Laughter, M.D.  Diplomate, [employer] of [employer] and Neurology ( Neurology). 07/26/2019, 5:03 PM

## 2019-07-26 NOTE — Progress Notes (Signed)
PROGRESS NOTE    Brianna Burns  BJY:[id number] DOB: 1934/06/18 DOA: 07/25/2019 PCP: Christain [place], MD    Brief Narrative:  Brianna Burns is a 85 y.o. female with medical history significant for hypertension, diabetes mellitus, DVT and recurrent PE. Patient was brought to the ED via EMS with reports of slurred speech and unable to follow directions with confusion.  Patient was last seen normal by her daughter at about 2:30 PM.  Daughter was gone for about 1 to 2 hours and came back and found patient on the ground confused.  Patient's daughter reported that patient speech was both garbled and confused.  This is very different from patient's baseline.  At baseline she ambulates with a walker mostly when outside the home, but holds onto objects/the walls when ambulating inside the house.  She occasionally has some occasional short-term memory loss, but is able to hold a conversation and recognize family. She reports 2 episodes of loose stools today, but this is patient's baseline, and has been attributed to patient's Metformin.  No vomiting, no cough, no difficulty breathing.  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  In the ED, Temperature 98.4, respiratory 23, blood pressure 111/58.  WBC 10.  Troponin 274.  Creatinine 1.33.  EKG without significant abnormalities.  UA with positive nitrites many bacteria.  Patient required 2 mg of Ativan and IV haloperidol so imaging could be done.  As patient was very agitated.  Head CT, CTA head and neck, CT perfusion study which were unremarkable.  Portable chest x-ray showed atelectasis versus scarring, central vascular crowding.  Portable pelvic x-ray without acute abnormality.Tele-neurology was consulted-was limited as patient was inattentive, hard to arouse, recommended admission for multifactorial encephalopathy, MRI brain.   Assessment & Plan:   Principal Problem:   AMS  (altered mental status) Active Problems:   HTN (hypertension)   DM (diabetes mellitus), type 2 ([place]), NIDDM   Recurrent pulmonary embolism ([place])   Acute metabolic encephalopathy Presenting from home with confusion, slurred speech, agitation.  Patient usually alert and oriented and ambulatory at baseline per daughter's report.  Patient with fever to 102.  White blood cell count 10.8.  Urinalysis with negative leukoesterase, positive nitrite, many bacteria.  UDS negative.  MR brain motion degraded but with questionable restricted diffusion left temporal lobe.  Etiology of her encephalopathy likely secondary to urinary tract infection/sepsis, but also consider stroke with MRI findings. --Continue treatment as below --Haldol as needed for agitation --Supportive care  Sepsis, present on admission Urinary tract infection Patient presenting with confusion, fall.  Urinalysis with negative leukoesterase, positive nitrite and many bacteria.  Fever of 102 F.  Patient's daughter at bedside states this is happened in the past with UTI causing significant confusion. --Urine culture: Pending --Will order blood cultures, not performed at time of admission --Continue ceftriaxone 1 g IV every 24 hours --Continue supportive care with antipyretics  Questionable CVA CT head, CT angiogram head and neck, cerebral perfusion study unremarkable.  MR brain motion degraded study but with questionable restricted perfusion left temporal lobe. --Neurology consulted --Pending PT/OT/SLP evaluation --TTE pending --Lipid panel pending --Holding home Eliquis per teleneurology recommendations --Continue IV fluid hydration until mentation improves  Elevated troponin Troponin 274>736>963>632.  EKG normal sinus rhythm, rate 91, QTc 468, no concerning ST elevation/depressions or T wave inversions.  Suspect demand ischemia from unclear downtime/fall. --Cardiology consulted, appreciate assistance --TTE pending --Eliquis  has been  held for now following neurology recommendations.  Fall Head CT without acute abnormality.  Likely secondary to encephalopathy, at baseline uses a walker. --PT/OT evaluation  Hypertension Blood pressure 115/85, stable --Allow for permissive hypertension, hold metoprolol and losartan.  Type 2 diabetes mellitus Hemoglobin A1c 6.7 12/21/2018.  On Metformin outpatient. --Hold oral hypoglycemics while inpatient --Recheck hemoglobin A1c --Insulin/scale for coverage --CBG before every meal/at bedtime  Recurrent pulmonary embolism, DVT, IVC filter removal --Apixaban held for now, following neurology recommendations, resume as soon as possible when able.   DVT prophylaxis: SCDs Code Status: Full code Family Communication: Daughter present at bedside  Disposition Plan:  Status is: Inpatient  Remains inpatient appropriate because:Altered mental status, Ongoing diagnostic testing needed not appropriate for outpatient work up, Unsafe d/c plan and IV treatments appropriate due to intensity of illness or inability to take PO   Dispo: The patient is from: Home              Anticipated d/c is to: to be determined              Anticipated d/c date is: 3 days              Patient currently is not medically stable to d/c.   Consultants:   Cardiology  Neurology  Procedures:   TTE: Pending  Antimicrobials:   Ceftriaxone 6/15>>   Subjective: Patient seen and examined at bedside, confused.  Daughter present.  Daughter reports that she has had episodes like this in the past when she has had bad UTIs.  Usually, patient lives at home with her husband and her daughter stays with her during the day.  The patient apparently was wandering to her son room in which she fell unobserved and was found down by family members for unclear amount of time.  Unable to obtain any further ROS from patient as she is confused and currently in restraints/meds.  Nursing concerned about her  agitation, although she has not received any Haldol since last night at 2311; which apparently has helped.  No other concerns at this time.  Objective: Vitals:   07/26/19 0831 07/26/19 0901 07/26/19 1100 07/26/19 1305  BP:  (!) 125/52  124/80  Pulse:  (!) 103  71  Resp:  16  16  Temp: (!) 102.1 F (38.9 C) 98.4 F (36.9 C) 98.4 F (36.9 C) 99.7 F (37.6 C)  TempSrc: Rectal Oral Oral Oral  SpO2:  98%  100%  Weight:  78.7 kg    Height:  5\' 2"  (1.575 m)     No intake or output data in the 24 hours ending 07/26/19 1438 Filed Weights   07/26/19 0901  Weight: 78.7 kg    Examination:  General exam: Appears anxious, confused Respiratory system: Clear to auscultation. Respiratory effort normal.  Oxygenating well on room air Cardiovascular system: S1 & S2 heard, RRR. No JVD, murmurs, rubs, gallops or clicks. No pedal edema. Gastrointestinal system: Abdomen is nondistended, soft and nontender. No organomegaly or masses felt. Normal bowel sounds heard. Central nervous system: Alert, confused, not oriented to person/place/time/situation.  Extremities: Moves all extremities independently Skin: No rashes, lesions or ulcers Psychiatry: Anxious mood, unable to evaluate further secondary to mental status    Data Reviewed: I have personally reviewed following labs and imaging studies  CBC: Recent Labs  Lab 07/25/19 1707 07/25/19 1714 07/26/19 0406  WBC 10.0  --  10.8*  NEUTROABS 7.4  --   --   HGB 11.9* 13.3 10.4*  HCT 39.3 39.0 34.5*  MCV 90.3  --  90.3  PLT 209  --  166   Basic Metabolic Panel: Recent Labs  Lab 07/25/19 1707 07/25/19 1714 07/26/19 0406  NA 140 141 140  K 4.0 4.3 4.3  CL 101 103 105  CO2 25  --  25  GLUCOSE 170* 168* 168*  BUN 33* 30* 23  CREATININE 1.33* 1.40* 1.06*  CALCIUM 9.6  --  8.8*   GFR: Estimated Creatinine Clearance: 39.7 mL/min (A) (by C-G formula based on SCr of 1.06 mg/dL (H)). Liver Function Tests: Recent Labs  Lab 07/25/19 1707   AST 28  ALT 17  ALKPHOS 44  BILITOT 0.5  PROT 7.4  ALBUMIN 4.3   No results for input(s): LIPASE, AMYLASE in the last 168 hours. No results for input(s): AMMONIA in the last 168 hours. Coagulation Profile: Recent Labs  Lab 07/25/19 1707  INR 1.4*   Cardiac Enzymes: No results for input(s): CKTOTAL, CKMB, CKMBINDEX, TROPONINI in the last 168 hours. BNP (last 3 results) No results for input(s): PROBNP in the last 8760 hours. HbA1C: No results for input(s): HGBA1C in the last 72 hours. CBG: Recent Labs  Lab 07/25/19 1642 07/26/19 1136  GLUCAP 136* 150*   Lipid Profile: No results for input(s): CHOL, HDL, LDLCALC, TRIG, CHOLHDL, LDLDIRECT in the last 72 hours. Thyroid Function Tests: No results for input(s): TSH, T4TOTAL, FREET4, T3FREE, THYROIDAB in the last 72 hours. Anemia Panel: No results for input(s): VITAMINB12, FOLATE, FERRITIN, TIBC, IRON, RETICCTPCT in the last 72 hours. Sepsis Labs: Recent Labs  Lab 07/25/19 1707  LATICACIDVEN 1.9    Recent Results (from the past 240 hour(s))  SARS Coronavirus 2 by RT PCR (hospital order, performed in [facility] hospital lab) Nasopharyngeal Nasopharyngeal Swab     Status: None   Collection Time: 07/25/19 11:18 PM   Specimen: Nasopharyngeal Swab  Result Value Ref Range Status   SARS Coronavirus 2 NEGATIVE NEGATIVE Final    Comment: (NOTE) SARS-CoV-2 target nucleic acids are NOT DETECTED.  The SARS-CoV-2 RNA is generally detectable in upper and lower respiratory specimens during the acute phase of infection. The lowest concentration of SARS-CoV-2 viral copies this assay can detect is 250 copies / mL. A negative result does not preclude SARS-CoV-2 infection and should not be used as the sole basis for treatment or other patient management decisions.  A negative result may occur with improper specimen collection / handling, submission of specimen other than nasopharyngeal swab, presence of viral mutation(s) within  the areas targeted by this assay, and inadequate number of viral copies (<250 copies / mL). A negative result must be combined with clinical observations, patient history, and epidemiological information.  Fact Sheet for Patients:   StrictlyIdeas.no  Fact Sheet for Healthcare Providers: BankingDealers.co.za  This test is not yet approved or  cleared by the [place] FDA and has been authorized for detection and/or diagnosis of SARS-CoV-2 by FDA under an Emergency Use Authorization (EUA).  This EUA will remain in effect (meaning this test can be used) for the duration of the COVID-19 declaration under Section 564(b)(1) of the Act, 21 U.S.C. section 360bbb-3(b)(1), unless the authorization is terminated or revoked sooner.  Performed at [facility], [address] [place],  [address]          Radiology Studies: CT ANGIO HEAD W OR WO CONTRAST  Result Date: 07/25/2019 CLINICAL DATA:  Dysarthria and confusion. EXAM: CT ANGIOGRAPHY HEAD AND NECK CT PERFUSION BRAIN TECHNIQUE: Multidetector  CT imaging of the head and neck was performed using the standard protocol during bolus administration of intravenous contrast. Multiplanar CT image reconstructions and MIPs were obtained to evaluate the vascular anatomy. Carotid stenosis measurements (when applicable) are obtained utilizing NASCET criteria, using the distal internal carotid diameter as the denominator. Multiphase CT imaging of the brain was performed following IV bolus contrast injection. Subsequent parametric perfusion maps were calculated using RAPID software. CONTRAST:  183mL OMNIPAQUE IOHEXOL 350 MG/ML SOLN COMPARISON:  Head CT earlier same day. FINDINGS: CTA NECK FINDINGS Aortic arch: Mild aortic atherosclerosis. Right carotid system: Normal.  Tortuous vessels but no stenosis. Left carotid system: Normal tortuous vessels but no stenosis. Vertebral arteries: Both vertebral artery  origins are widely patent. Both vertebral arteries are widely patent through the cervical region to the foramen magnum. Skeleton: Negative Other neck: No mass or adenopathy. Upper chest: Negative Review of the MIP images confirms the above findings CTA HEAD FINDINGS Anterior circulation: Both internal carotid arteries are widely patent through the skull base and siphon regions. The anterior and middle cerebral vessels are patent. No large or medium vessel occlusion. No proximal stenosis. Posterior circulation: Both vertebral arteries widely patent to the basilar. No basilar stenosis. Posterior circulation branch vessels are normal. Fetal origin right PCA. Venous sinuses: Normal Anatomic variants: None Review of the MIP images confirms the above findings CT Brain Perfusion Findings: ASPECTS: 10 CBF (<30%) Volume: 39mL Perfusion (Tmax>6.0s) volume: 54mL Mismatch Volume: 43mL Infarction Location:None IMPRESSION: No large vessel occlusion.  Negative perfusion study. No carotid or vertebral stenosis. Tortuous vessels suggesting hypertension. These results were called by telephone at the time of interpretation on 07/25/2019 at 5:52 pm to provider [facility] ZAMMIT , who verbally acknowledged these results. Electronically Signed   By: Nelson Chimes M.D.   On: 07/25/2019 17:52   CT ANGIO NECK W OR WO CONTRAST  Result Date: 07/25/2019 CLINICAL DATA:  Dysarthria and confusion. EXAM: CT ANGIOGRAPHY HEAD AND NECK CT PERFUSION BRAIN TECHNIQUE: Multidetector CT imaging of the head and neck was performed using the standard protocol during bolus administration of intravenous contrast. Multiplanar CT image reconstructions and MIPs were obtained to evaluate the vascular anatomy. Carotid stenosis measurements (when applicable) are obtained utilizing NASCET criteria, using the distal internal carotid diameter as the denominator. Multiphase CT imaging of the brain was performed following IV bolus contrast injection. Subsequent parametric  perfusion maps were calculated using RAPID software. CONTRAST:  113mL OMNIPAQUE IOHEXOL 350 MG/ML SOLN COMPARISON:  Head CT earlier same day. FINDINGS: CTA NECK FINDINGS Aortic arch: Mild aortic atherosclerosis. Right carotid system: Normal.  Tortuous vessels but no stenosis. Left carotid system: Normal tortuous vessels but no stenosis. Vertebral arteries: Both vertebral artery origins are widely patent. Both vertebral arteries are widely patent through the cervical region to the foramen magnum. Skeleton: Negative Other neck: No mass or adenopathy. Upper chest: Negative Review of the MIP images confirms the above findings CTA HEAD FINDINGS Anterior circulation: Both internal carotid arteries are widely patent through the skull base and siphon regions. The anterior and middle cerebral vessels are patent. No large or medium vessel occlusion. No proximal stenosis. Posterior circulation: Both vertebral arteries widely patent to the basilar. No basilar stenosis. Posterior circulation branch vessels are normal. Fetal origin right PCA. Venous sinuses: Normal Anatomic variants: None Review of the MIP images confirms the above findings CT Brain Perfusion Findings: ASPECTS: 10 CBF (<30%) Volume: 46mL Perfusion (Tmax>6.0s) volume: 28mL Mismatch Volume: 38mL Infarction Location:None IMPRESSION: No large vessel occlusion.  Negative perfusion study. No carotid or vertebral stenosis. Tortuous vessels suggesting hypertension. These results were called by telephone at the time of interpretation on 07/25/2019 at 5:52 pm to provider [facility] ZAMMIT , who verbally acknowledged these results. Electronically Signed   By: Nelson Chimes M.D.   On: 07/25/2019 17:52   MR BRAIN WO CONTRAST  Result Date: 07/25/2019 CLINICAL DATA:  Altered mental status. Slurred speech and confusion. EXAM: MRI HEAD WITHOUT CONTRAST TECHNIQUE: Multiplanar, multiecho pulse sequences of the brain and surrounding structures were obtained without intravenous contrast.  COMPARISON:  CT studies same day FINDINGS: Brain: Severely motion degraded exam. Diffusion imaging only was obtained, and this is markedly suboptimal. One could question some restricted diffusion in the left temporal lobe. This is not a reliable finding. Vascular: No data Skull and upper cervical spine: No data Sinuses/Orbits: No data Other: No data IMPRESSION: Severely motion degraded exam. Only diffusion imaging was achieved, and this is of very limited utility. One could question some restricted diffusion in the left temporal lobe, but this is not a reliable finding at this quality. Electronically Signed   By: Nelson Chimes M.D.   On: 07/25/2019 20:44   DG Pelvis Portable  Result Date: 07/25/2019 CLINICAL DATA:  Fall, found at bottom of steps EXAM: PORTABLE PELVIS 1-2 VIEWS COMPARISON:  None. FINDINGS: Contrast material within the urinary bladder obscures portions of the pelvic bones. The SI joints are non widened. The pubic symphysis is non widened. Inferior pubic rami appear intact. The femoral heads project in joint. IMPRESSION: No acute osseous abnormality Electronically Signed   By: Donavan Foil M.D.   On: 07/25/2019 18:53   CT CEREBRAL PERFUSION W CONTRAST  Result Date: 07/25/2019 CLINICAL DATA:  Dysarthria and confusion. EXAM: CT ANGIOGRAPHY HEAD AND NECK CT PERFUSION BRAIN TECHNIQUE: Multidetector CT imaging of the head and neck was performed using the standard protocol during bolus administration of intravenous contrast. Multiplanar CT image reconstructions and MIPs were obtained to evaluate the vascular anatomy. Carotid stenosis measurements (when applicable) are obtained utilizing NASCET criteria, using the distal internal carotid diameter as the denominator. Multiphase CT imaging of the brain was performed following IV bolus contrast injection. Subsequent parametric perfusion maps were calculated using RAPID software. CONTRAST:  128mL OMNIPAQUE IOHEXOL 350 MG/ML SOLN COMPARISON:  Head CT  earlier same day. FINDINGS: CTA NECK FINDINGS Aortic arch: Mild aortic atherosclerosis. Right carotid system: Normal.  Tortuous vessels but no stenosis. Left carotid system: Normal tortuous vessels but no stenosis. Vertebral arteries: Both vertebral artery origins are widely patent. Both vertebral arteries are widely patent through the cervical region to the foramen magnum. Skeleton: Negative Other neck: No mass or adenopathy. Upper chest: Negative Review of the MIP images confirms the above findings CTA HEAD FINDINGS Anterior circulation: Both internal carotid arteries are widely patent through the skull base and siphon regions. The anterior and middle cerebral vessels are patent. No large or medium vessel occlusion. No proximal stenosis. Posterior circulation: Both vertebral arteries widely patent to the basilar. No basilar stenosis. Posterior circulation branch vessels are normal. Fetal origin right PCA. Venous sinuses: Normal Anatomic variants: None Review of the MIP images confirms the above findings CT Brain Perfusion Findings: ASPECTS: 10 CBF (<30%) Volume: 21mL Perfusion (Tmax>6.0s) volume: 37mL Mismatch Volume: 30mL Infarction Location:None IMPRESSION: No large vessel occlusion.  Negative perfusion study. No carotid or vertebral stenosis. Tortuous vessels suggesting hypertension. These results were called by telephone at the time of interpretation on 07/25/2019 at 5:52 pm to provider [facility]  ZAMMIT , who verbally acknowledged these results. Electronically Signed   By: Nelson Chimes M.D.   On: 07/25/2019 17:52   DG Chest Port 1 View  Result Date: 07/25/2019 CLINICAL DATA:  Fall EXAM: PORTABLE CHEST 1 VIEW COMPARISON:  Radiograph 03/05/2017, 11/25/2017 FINDINGS: Low lung volumes with streaky areas of atelectatic change in central vascular crowding. More bandlike opacity present in the left lung base likely reflect further atelectasis or scarring. No focal consolidation or convincing features of edema. No  pneumothorax or visible effusion. Cardiomediastinal contours are similar to prior accounting for differences in technique. Remote lower thoracic compression deformity and vertebroplasty changes as well as several healed right posterolateral rib deformities. Multilevel degenerative changes are present in the imaged portions of the spine. No acute osseous or soft tissue abnormality. Telemetry leads overlie the chest. IMPRESSION: Low lung volumes with streaky areas of atelectatic change and central vascular crowding. More bandlike opacity present in the left lung base likely reflect further atelectasis or scarring. Remote rib fractures and a prior lower thoracic vertebroplasty. Similar to prior. Electronically Signed   By: Lovena Le M.D.   On: 07/25/2019 18:54   ECHOCARDIOGRAM COMPLETE  Result Date: 07/26/2019    ECHOCARDIOGRAM REPORT   Patient Name:   Brianna Burns Date of Exam: 07/26/2019 Medical Rec #:  [id number]       Height:       62.0 in Accession #:    [id number]      Weight:       173.5 lb Date of Birth:  [birth year]        BSA:          1.800 m Patient Age:    33 years        BP:           125/52 mmHg Patient Gender: F               HR:           103 bpm. Exam Location:  [facility] Procedure: 2D Echo Indications:    Elevated Troponin  History:        Patient has prior history of Echocardiogram examinations, most                 recent 12/25/2018. Risk Factors:Hypertension, Diabetes and                 Non-Smoker. GERD, Recurrent pulmonary embolism , UTI.  Sonographer:    Leavy Cella RDCS (AE) Referring Phys: [place]  1. Left ventricular ejection fraction, by estimation, is 60 to 65%. The left ventricle has normal function. The left ventricle has no regional wall motion abnormalities. Left ventricular diastolic parameters are consistent with age-related delayed relaxation (normal).  2. Right ventricular systolic function is normal. The right ventricular size is normal.  There is mildly elevated pulmonary artery systolic pressure.  3. The mitral valve is normal in structure. Trivial mitral valve regurgitation. No evidence of mitral stenosis.  4. The aortic valve is tricuspid. Aortic valve regurgitation is not visualized. Mild to moderate aortic valve sclerosis/calcification is present, without any evidence of aortic stenosis.  5. The inferior vena cava is normal in size with greater than 50% respiratory variability, suggesting right atrial pressure of 3 mmHg. FINDINGS  Left Ventricle: Left ventricular ejection fraction, by estimation, is 60 to 65%. The left ventricle has normal function. The left ventricle has no regional wall motion abnormalities. The left ventricular internal cavity size was  normal in size. There is  no left ventricular hypertrophy. Left ventricular diastolic parameters are consistent with age-related delayed relaxation (normal). Right Ventricle: The right ventricular size is normal. No increase in right ventricular wall thickness. Right ventricular systolic function is normal. There is mildly elevated pulmonary artery systolic pressure. The tricuspid regurgitant velocity is 2.86  m/s, and with an assumed right atrial pressure of 10 mmHg, the estimated right ventricular systolic pressure is 71.2 mmHg. Left Atrium: Left atrial size was normal in size. Right Atrium: Right atrial size was normal in size. Pericardium: There is no evidence of pericardial effusion. Mitral Valve: The mitral valve is normal in structure. Normal mobility of the mitral valve leaflets. Trivial mitral valve regurgitation. No evidence of mitral valve stenosis. Tricuspid Valve: The tricuspid valve is normal in structure. Tricuspid valve regurgitation is mild . No evidence of tricuspid stenosis. Aortic Valve: The aortic valve is tricuspid. Aortic valve regurgitation is not visualized. Mild to moderate aortic valve sclerosis/calcification is present, without any evidence of aortic stenosis.  Pulmonic Valve: The pulmonic valve was normal in structure. Pulmonic valve regurgitation is not visualized. No evidence of pulmonic stenosis. Aorta: The aortic root is normal in size and structure. Venous: The inferior vena cava is normal in size with greater than 50% respiratory variability, suggesting right atrial pressure of 3 mmHg. IAS/Shunts: No atrial level shunt detected by color flow Doppler.  LEFT VENTRICLE PLAX 2D LVIDd:         4.21 cm  Diastology LVIDs:         2.71 cm  LV e' lateral:   10.70 cm/s LV PW:         1.05 cm  LV E/e' lateral: 8.0 LV IVS:        0.87 cm  LV e' medial:    6.74 cm/s LVOT diam:     1.80 cm  LV E/e' medial:  12.7 LVOT Area:     2.54 cm  RIGHT VENTRICLE RV S prime:     15.40 cm/s TAPSE (M-mode): 2.8 cm LEFT ATRIUM             Index       RIGHT ATRIUM          Index LA diam:        2.80 cm 1.56 cm/m  RA Area:     9.11 cm LA Vol (A2C):   63.9 ml 35.51 ml/m RA Volume:   16.40 ml 9.11 ml/m LA Vol (A4C):   42.6 ml 23.67 ml/m LA Biplane Vol: 54.6 ml 30.34 ml/m   AORTA Ao Root diam: 2.90 cm MITRAL VALVE               TRICUSPID VALVE MV Area (PHT): 3.53 cm    TR Peak grad:   32.7 mmHg MV Decel Time: 215 msec    TR Vmax:        286.00 cm/s MV E velocity: 85.90 cm/s MV A velocity: 59.20 cm/s  SHUNTS MV E/A ratio:  1.45        Systemic Diam: 1.80 cm Jenkins Rouge MD Electronically signed by Jenkins Rouge MD Signature Date/Time: 07/26/2019/12:09:47 PM    Final    CT HEAD CODE STROKE WO CONTRAST  Result Date: 07/25/2019 CLINICAL DATA:  Code stroke.  Slurred speech. EXAM: CT HEAD WITHOUT CONTRAST TECHNIQUE: Contiguous axial images were obtained from the base of the skull through the vertex without intravenous contrast. COMPARISON:  12/24/2018 FINDINGS: Brain: Age related atrophy. Chronic small-vessel ischemic changes  of the cerebral hemispheric white matter. No sign of acute infarction, mass lesion, hemorrhage, hydrocephalus or extra-axial collection. Vascular: There is atherosclerotic  calcification of the major vessels at the base of the brain. Skull: Negative Sinuses/Orbits: Clear sinuses.  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 Other: None ASPECTS ([place] Stroke Program Early CT Score) - Ganglionic level infarction (caudate, lentiform nuclei, internal capsule, insula, M1-M3 cortex): 7 - Supraganglionic infarction (M4-M6 cortex): 3 Total score (0-10 with 10 being normal): 10 IMPRESSION: 1. No acute finding by CT. Atrophy and chronic small-vessel ischemic changes of the white matter. 2. ASPECTS is 10 3. These results were called by telephone at the time of interpretation on 07/25/2019 at 5:12 pm to provider [facility] ZAMMIT , who verbally acknowledged these results. Electronically Signed   By: Nelson Chimes M.D.   On: 07/25/2019 17:12        Scheduled Meds: . insulin aspart  0-9 Units Subcutaneous TID WC  . pantoprazole  40 mg Oral Daily  . rosuvastatin  5 mg Oral Daily   Continuous Infusions: . cefTRIAXone (ROCEPHIN)  IV Stopped (07/25/19 2359)     LOS: 1 day    Time spent: 41 minutes spent on chart review, discussion with nursing staff, consultants, updating family and interview/physical exam; more than 50% of that time was spent in counseling and/or coordination of care.    Lupe Handley J [place] DO [facility] Available via Epic secure chat 7am-7pm After these hours, please refer to coverage provider listed on [email or web address] 07/26/2019, 2:38 PM

## 2019-07-26 NOTE — Progress Notes (Signed)
SLP Cancellation Note  Patient Details Name: MAKAYLEE SPIELBERG MRN: [id number] DOB: [birth year]   Cancelled treatment:       Reason Eval/Treat Not Completed: Other (comment); Pt restless and confused which her daughter reports happens when she has UTI. She was observed to speak in some sentences. Will defer SLP until Pt less restless and to allow benefit of antibiotics.  Thank you,  Genene Churn, [place]    [place] 07/26/2019, 2:58 PM

## 2019-07-26 NOTE — ED Notes (Signed)
Date and time results received: 07/26/19 0515 (use smartphrase ".now" to insert current time)  Test: troponin  Critical Value: 963  Name of Provider Notified: Dr Olevia Bowens  Orders Received? Or Actions Taken?: Actions Taken: orders received

## 2019-07-26 NOTE — Plan of Care (Signed)
  Problem: Acute Rehab PT Goals(only PT should resolve) Goal: Pt Will Go Supine/Side To Sit Outcome: Progressing Flowsheets (Taken 07/26/2019 1514) Pt will go Supine/Side to Sit: with minimal assist Goal: Patient Will Transfer Sit To/From Stand Outcome: Progressing Flowsheets (Taken 07/26/2019 1514) Patient will transfer sit to/from stand:  with minimal assist  with moderate assist Goal: Pt Will Transfer Bed To Chair/Chair To Bed Outcome: Progressing Flowsheets (Taken 07/26/2019 1514) Pt will Transfer Bed to Chair/Chair to Bed: with mod assist Goal: Pt Will Ambulate Outcome: Progressing Flowsheets (Taken 07/26/2019 1514) Pt will Ambulate:  15 feet  with moderate assist  with rolling walker   3:14 PM, 07/26/19 Lonell Grandchild, MPT Physical Therapist with [facility] [phone] office [phone] mobile phone

## 2019-07-26 NOTE — NC FL2 (Signed)
[place] MEDICAID FL2 LEVEL OF CARE SCREENING TOOL     IDENTIFICATION  Patient Name: Brianna Burns Birthdate: 04-09-1934 Sex: female Admission Date (Current Location): 07/25/2019  [facility] and [place] Number:  [employer] and Address:  [place] [address] [place]      Provider Number: [id number]  Attending Physician Name and Address:  [place] Eric J, DO  Relative Name and Phone Number:  Ernestene Mention (Daughter) [phone]    Current Level of Care: Hospital Recommended Level of Care: [place] Prior Approval Number:    Date Approved/Denied:   PASRR Number: [id number]  Discharge Plan: SNF    Current Diagnoses: Patient Active Problem List   Diagnosis Date Noted  . UTI (urinary tract infection) 12/30/2018  . History of recurrent deep vein thrombosis (DVT) 12/29/2018  . Recurrent pulmonary embolism ([place]) 12/29/2018  . S/P IVC filter 12/29/2018  . Physical deconditioning 12/29/2018  . AMS (altered mental status) 12/24/2018  . E. coli UTI (urinary tract infection) 12/24/2018  . Anemia, chronic disease 12/24/2018  . Displaced fracture of right patella 12/21/2018  . HTN (hypertension) 06/27/2012  . DM (diabetes mellitus), type 2 ([place]), NIDDM 06/27/2012  . GERD (gastroesophageal reflux disease) 06/27/2012  . Nocturia 06/27/2012    Orientation RESPIRATION BLADDER Height & Weight        Normal Incontinent Weight: 173 lb 8 oz (78.7 kg) Height:  5\' 2"  (157.5 cm)  BEHAVIORAL SYMPTOMS/MOOD NEUROLOGICAL BOWEL NUTRITION STATUS      Incontinent Diet (heart healthy/carb modified)  AMBULATORY STATUS COMMUNICATION OF NEEDS Skin   Limited Assist Verbally Normal                       Personal Care Assistance Level of Assistance  Bathing, Dressing, Feeding Bathing Assistance: Limited assistance Feeding assistance: Independent Dressing Assistance: Limited assistance     Functional Limitations Info  Sight,  Speech, Hearing Sight Info: Adequate Hearing Info: Adequate Speech Info: Adequate    SPECIAL CARE FACTORS FREQUENCY  PT (By licensed PT)     PT Frequency: 5x/week              Contractures Contractures Info: Not present    Additional Factors Info  Code Status, Allergies Code Status Info: Full Code Allergies Info: Ativan, Baclofen, Hydrocodone-acetaminophen, general anesthesia, Percocet           Current Medications (07/26/2019):  This is the current hospital active medication list Current Facility-Administered Medications  Medication Dose Route Frequency Provider Last Rate Last Admin  . 0.9 %  sodium chloride infusion   Intravenous Continuous Emokpae, Ejiroghene E, MD 75 mL/hr at 07/26/19 1025 New Bag at 07/26/19 1025  . acetaminophen (TYLENOL) tablet 650 mg  650 mg Oral Q6H PRN Emokpae, Ejiroghene E, MD       Or  . acetaminophen (TYLENOL) suppository 650 mg  650 mg Rectal Q6H PRN Emokpae, Ejiroghene E, MD   650 mg at 07/26/19 0834  . cefTRIAXone (ROCEPHIN) 1 g in sodium chloride 0.9 % 100 mL IVPB  1 g Intravenous Q24H Emokpae, Ejiroghene E, MD   Stopped at 07/25/19 2359  . haloperidol lactate (HALDOL) injection 5 mg  5 mg Intramuscular Q6H PRN Emokpae, Ejiroghene E, MD   5 mg at 07/25/19 2311  . insulin aspart (novoLOG) injection 0-9 Units  0-9 Units Subcutaneous TID WC Emokpae, Ejiroghene E, MD   1 Units at 07/26/19 1251  . labetalol (NORMODYNE) injection 10 mg  10  mg Intravenous Q2H PRN Emokpae, Ejiroghene E, MD      . ondansetron (ZOFRAN) tablet 4 mg  4 mg Oral Q6H PRN Emokpae, Ejiroghene E, MD       Or  . ondansetron (ZOFRAN) injection 4 mg  4 mg Intravenous Q6H PRN Emokpae, Ejiroghene E, MD      . pantoprazole (PROTONIX) EC tablet 40 mg  40 mg Oral Daily Emokpae, Ejiroghene E, MD      . rosuvastatin (CRESTOR) tablet 5 mg  5 mg Oral Daily Emokpae, Ejiroghene E, MD         Discharge Medications: Please see discharge summary for a list of discharge  medications.  Relevant Imaging Results:  Relevant Lab Results:   Additional Information ss#[id number] Aydian Dimmick D, LCSW

## 2019-07-26 NOTE — TOC Progression Note (Signed)
Transition of Care [facility]) - Progression Note   Patient Details  Name: Brianna Burns MRN: [id number] Date of Birth: 05-Dec-1934  Transition of [facility]) CM/SW [place], LCSW Phone Number: 07/26/2019, 4:17 PM  Clinical Narrative: CSW called Bernadene Bell and spoke with [place] to start insurance authorization. Reference # [id number]. Clinicals faxed to [facility].  Expected Discharge Plan: [place] Barriers to Discharge: Continued Medical Work up  Expected Discharge Plan and Services Expected Discharge Plan: [place] Choice: [place] arrangements for the past 2 months: Single Family Home  Readmission Risk Interventions No flowsheet data found.

## 2019-07-26 NOTE — Consult Note (Addendum)
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  History:        Patient has prior history of Echocardiogram examinations, most                 recent 12/25/2018. Risk Factors:Hypertension, Diabetes and                 Non-Smoker. GERD, Recurrent pulmonary embolism , UTI.  Sonographer:    Leavy Cella RDCS (AE) Referring Phys: [place]  1. Left ventricular ejection fraction, by estimation, is 60 to 65%. The left ventricle has normal function. The left ventricle has no regional wall motion abnormalities. Left ventricular diastolic parameters are consistent with age-related delayed relaxation (normal).  2. Right ventricular systolic function is normal. The right ventricular size is normal.  There is mildly elevated pulmonary artery systolic pressure.  3. The mitral valve is normal in structure. Trivial mitral valve regurgitation. No evidence of mitral stenosis.  4. The aortic valve is tricuspid. Aortic valve regurgitation is not visualized. Mild to moderate aortic valve sclerosis/calcification is present, without any evidence of aortic stenosis.  5. The inferior vena cava is normal in size with greater than 50% respiratory variability, suggesting right atrial pressure of 3 mmHg. FINDINGS  Left Ventricle: Left ventricular ejection fraction, by estimation, is 60 to 65%. The left ventricle has normal function. The left ventricle has no regional wall motion abnormalities. The left ventricular internal cavity size was normal in size. There is  no left ventricular hypertrophy. Left ventricular diastolic parameters are consistent with age-related delayed relaxation (normal). Right Ventricle: The right ventricular size is normal. No increase in right ventricular wall thickness. Right ventricular systolic function is normal. There is mildly elevated pulmonary artery systolic pressure. The tricuspid regurgitant velocity is 2.86  m/s, and with an assumed right atrial pressure of 10 mmHg, the estimated right ventricular systolic pressure is 40.3 mmHg. Left Atrium: Left atrial size was normal in size. Right Atrium: Right atrial size was normal in size. Pericardium: There is no evidence of pericardial effusion. Mitral Valve: The mitral valve is normal in structure. Normal mobility of the mitral valve leaflets. Trivial mitral valve regurgitation. No evidence of mitral valve stenosis. Tricuspid Valve: The tricuspid valve is normal in structure. Tricuspid valve regurgitation is mild . No evidence of tricuspid stenosis. Aortic Valve: The aortic valve is tricuspid. Aortic valve regurgitation is not visualized. Mild to moderate aortic valve sclerosis/calcification is present, without any evidence of aortic stenosis.  Pulmonic Valve: The  pulmonic valve was normal in structure. Pulmonic valve regurgitation is not visualized. No evidence of pulmonic stenosis. Aorta: The aortic root is normal in size and structure. Venous: The inferior vena cava is normal in size with greater than 50% respiratory variability, suggesting right atrial pressure of 3 mmHg. IAS/Shunts: No atrial level shunt detected by color flow Doppler.  LEFT VENTRICLE PLAX 2D LVIDd:         4.21 cm  Diastology LVIDs:         2.71 cm  LV e' lateral:   10.70 cm/s LV PW:         1.05 cm  LV E/e' lateral: 8.0 LV IVS:        0.87 cm  LV e' medial:    6.74 cm/s LVOT diam:     1.80 cm  LV E/e' medial:  12.7 LVOT Area:     2.54 cm  RIGHT VENTRICLE RV S prime:     15.40 cm/s TAPSE (M-mode): 2.8 cm LEFT ATRIUM             Index       RIGHT ATRIUM          Index LA diam:        2.80 cm 1.56 cm/m  RA Area:     9.11 cm LA Vol (A2C):   63.9 ml 35.51 ml/m RA Volume:   16.40 ml 9.11 ml/m LA Vol (A4C):   42.6 ml 23.67 ml/m LA Biplane Vol: 54.6 ml 30.34 ml/m   AORTA Ao Root diam: 2.90 cm MITRAL VALVE               TRICUSPID VALVE MV Area (PHT): 3.53 cm    TR Peak grad:   32.7 mmHg MV Decel Time: 215 msec    TR Vmax:        286.00 cm/s MV E velocity: 85.90 cm/s MV A velocity: 59.20 cm/s  SHUNTS MV E/A ratio:  1.45        Systemic Diam: 1.80 cm Jenkins Rouge MD Electronically signed by Jenkins Rouge MD Signature Date/Time: 07/26/2019/12:09:47 PM    Final    CT HEAD CODE STROKE WO CONTRAST  Result Date: 07/25/2019 CLINICAL DATA:  Code stroke.  Slurred speech. EXAM: CT HEAD WITHOUT CONTRAST TECHNIQUE: Contiguous axial images were obtained from the base of the skull through the vertex without intravenous contrast. COMPARISON:  12/24/2018 FINDINGS: Brain: Age related atrophy. Chronic small-vessel ischemic changes of the cerebral hemispheric white matter. No sign of acute infarction, mass lesion, hemorrhage, hydrocephalus or extra-axial collection. Vascular: There is atherosclerotic  calcification of the major vessels at the base of the brain. Skull: Negative Sinuses/Orbits: Clear sinuses.  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 Other: None ASPECTS ([place] Stroke Program Early CT Score) - Ganglionic level infarction (caudate, lentiform nuclei, internal capsule, insula, M1-M3 cortex): 7 - Supraganglionic infarction (M4-M6 cortex): 3 Total score (0-10 with 10 being normal): 10 IMPRESSION: 1. No acute finding by CT. Atrophy and chronic small-vessel ischemic changes of the white matter. 2. ASPECTS is 10 3. These results were called by telephone at the time of interpretation on 07/25/2019 at 5:12 pm to provider [facility] Burns , who verbally acknowledged these results. Electronically Signed   By: Nelson Chimes M.D.   On: 07/25/2019 17:12     Assessment & Plan    1. Elevated Troponin Values - She presented for evaluation of worsening confusion and altered mental status after suffering a fall and has been found to have a  UTI. Neurologic evaluation has been limited due to motion degraded Brain MRI but CT Head was without acute findings.. - Initial HS troponin 274 with repeat values of 736, 963 and 632. EKG shows NSR, HR 91 with PAC's and no acute ST changes when compared to prior tracings.  An echocardiogram is pending to assess for any structural abnormalities. - She denies any recent anginal symptoms and family members are unaware of any known history of CAD or CHF. She does have multiple cardiac risk factors including HTN, HLD, Type 2 DM and family history of CAD.  Given her acute illness and possible neurological event, would not anticipate further inpatient ischemic evaluation at this time. We can arrange for a 30-day event monitor if recommended by Neurology.  2. History of PE/DVT - She is status post IVC filter but underwent removal of this in 04/2019. On Eliquis 5 mg twice daily prior to admission which is currently held pending further recommendations from Neurology.  3. HTN - BP was well  controlled at 125/52 on most recent check. PTA Losartan and Toprol-XL are currently held given issues of hypotension overnight.   4. HLD - Will recheck FLP. She has been continued on Crestor 10mg  daily.   5. UTI - Febrile to 102.1 overnight and UA concerning for UTI with culture results pending. She remains on IV Ceftriaxone.    For questions or updates, please contact [place] Please consult [email or web address] for contact info under Cardiology/STEMI.  Signed, Erma Heritage, PA-C 07/26/2019, 12:56 PM Pager: [phone]  Patient examined chart reviewed Discussed care with PA. Exam with elderly female dementia/delirium  clear lungs normal JVP no murmur abdomen soft palpable pedal pulses. ECG with no acute changes no chest pain and Echo with normal EF and no RWMA;s. Agree that no further inpatient cardiac w/u is needed. Can consider outpatient f/u once MS and UTI Rx and consider outpatient lexiscan myovue to further risk stratify. Primary service to further decide on ongoing anticoagulation risks with delerium / falls   Cardiology will sign off   Jenkins Rouge MD [facility]

## 2019-07-26 NOTE — Progress Notes (Signed)
Admitted earlier today and has been very restless and constantly moviing, pulling off mitts and grabbing things.  Speech is unintelligble and unable to drink from straw.  Daughter has been at bedside.  Daughter requested something to calm patient and received order for haldol which she said was effective for about 20 min in ED

## 2019-07-26 NOTE — Progress Notes (Signed)
Troponin uptrending in patient with encephalopathy likely 2/2 UTI,EKG without significant change. For now trend troponin, obtain Echo. May need cards evaluation.   E.Brazen Domangue, MD.

## 2019-07-26 NOTE — ED Notes (Signed)
Attempted report on pt. Brianna Gala, RN will call back for report.

## 2019-07-27 DIAGNOSIS — I639 Cerebral infarction, unspecified: Secondary | ICD-10-CM

## 2019-07-27 DIAGNOSIS — G9341 Metabolic encephalopathy: Secondary | ICD-10-CM

## 2019-07-27 LAB — GLUCOSE, CAPILLARY
Glucose-Capillary: 137 mg/dL — ABNORMAL HIGH (ref 70–99)
Glucose-Capillary: 149 mg/dL — ABNORMAL HIGH (ref 70–99)
Glucose-Capillary: 97 mg/dL (ref 70–99)
Glucose-Capillary: 132 mg/dL — ABNORMAL HIGH (ref 70–99)

## 2019-07-27 LAB — CBC
HCT: 32.6 % — ABNORMAL LOW (ref 36.0–46.0)
Hemoglobin: 9.9 g/dL — ABNORMAL LOW (ref 12.0–15.0)
MCH: 27.7 pg (ref 26.0–34.0)
MCHC: 30.4 g/dL (ref 30.0–36.0)
MCV: 91.1 fL (ref 80.0–100.0)
Platelets: 157 10*3/uL (ref 150–400)
RBC: 3.58 MIL/uL — ABNORMAL LOW (ref 3.87–5.11)
RDW: 21.7 % — ABNORMAL HIGH (ref 11.5–15.5)
WBC: 7.8 10*3/uL (ref 4.0–10.5)
nRBC: 0 % (ref 0.0–0.2)

## 2019-07-27 LAB — COMPREHENSIVE METABOLIC PANEL
ALT: 18 U/L (ref 0–44)
AST: 45 U/L — ABNORMAL HIGH (ref 15–41)
Albumin: 3.5 g/dL (ref 3.5–5.0)
Alkaline Phosphatase: 38 U/L (ref 38–126)
Anion gap: 10 (ref 5–15)
BUN: 13 mg/dL (ref 8–23)
CO2: 25 mmol/L (ref 22–32)
Calcium: 8.7 mg/dL — ABNORMAL LOW (ref 8.9–10.3)
Chloride: 103 mmol/L (ref 98–111)
Creatinine, Ser: 0.98 mg/dL (ref 0.44–1.00)
GFR calc Af Amer: >60 mL/min (ref >60)
GFR calc non Af Amer: 54 mL/min — ABNORMAL LOW (ref >60)
Glucose, Bld: 148 mg/dL — ABNORMAL HIGH (ref 70–99)
Potassium: 4 mmol/L (ref 3.5–5.1)
Sodium: 138 mmol/L (ref 135–145)
Total Bilirubin: 0.9 mg/dL (ref 0.3–1.2)
Total Protein: 6.2 g/dL — ABNORMAL LOW (ref 6.5–8.1)

## 2019-07-27 LAB — HEMOGLOBIN A1C
Hgb A1c MFr Bld: 7 % — ABNORMAL HIGH (ref 4.8–5.6)
Mean Plasma Glucose: 154 mg/dL

## 2019-07-27 LAB — LIPID PANEL
Cholesterol: 124 mg/dL (ref 0–200)
HDL: 60 mg/dL (ref >40)
LDL Cholesterol: 45 mg/dL (ref 0–99)
Total CHOL/HDL Ratio: 2.1 RATIO
Triglycerides: 94 mg/dL (ref <150)
VLDL: 19 mg/dL (ref 0–40)

## 2019-07-27 LAB — MAGNESIUM: Magnesium: 1.4 mg/dL — ABNORMAL LOW (ref 1.7–2.4)

## 2019-07-27 MED ORDER — LACTATED RINGERS IV SOLN: INTRAVENOUS | Status: DC

## 2019-07-27 MED ORDER — HALOPERIDOL LACTATE 5 MG/ML IJ SOLN
1.0000 mg | INTRAMUSCULAR | Status: DC | PRN
  Administered 2019-07-27 (×2): 1 mg via INTRAMUSCULAR
  Filled 2019-07-27 (×2): qty 1

## 2019-07-27 MED ORDER — OLANZAPINE 5 MG PO TABS
2.5000 mg | ORAL_TABLET | Freq: Every day | ORAL | Status: DC
  Administered 2019-07-27 – 2019-07-29 (×3): 2.5 mg via ORAL
  Filled 2019-07-27 (×3): qty 1

## 2019-07-27 NOTE — Progress Notes (Signed)
SLP Cancellation Note  Patient Details Name: Brianna Burns MRN: [id number] DOB: 1934/10/30   Cancelled treatment:       Reason Eval/Treat Not Completed: Other (comment) Pt continues to be restless and confused which her daughter reports happens when she has UTI. Further note inconsistent alertness today; ST will continue to defer SLP until Pt is appropriate to participate in evaluation and to allow benefit of antibiotics.  [place]ington Krotz H. Roddie Mc, CCC-SLP Speech Language Pathologist  [name] 07/27/2019, 10:18 AM

## 2019-07-27 NOTE — Progress Notes (Addendum)
PROGRESS NOTE    Brianna Burns  TIR:[id number] DOB: Jan 16, 1935 DOA: 07/25/2019 PCP: Christain Sacramento, MD    Brief Narrative:  Patient admitted to the hospital with working diagnosis of metabolic encephalopathy.  85 year old female with past medical history for hypertension, type 2 diabetes mellitus, DVT/PEand ambulatory dysfunction who presented auscultation slurred speech.  Apparently patient was at her usual state of health, after 1 to 2 hours of been unattended she was found down on the ground, with altered mentation.  Her speech was slurred and the patient was confused.  On her initial physical examination her temperature was 98.4, respiratory rate 23, blood pressure 111/58, heart rate 87.  She had dry mucous membranes, her lungs are clear to auscultation bilaterally, heart S1-S2, present rhythmic, soft abdomen, no lower extremity edema.  She had a 0.5 cm skin laceration left knee.  She was nonfocal. Sodium 140, potassium 4.0, chloride 101, bicarb 25, glucose 170, BUN 33, creatinine 1 3, AST 28, ALT 17, troponin I 274.  White count 10.0, hemoglobin 11.9, hematocrit 39.3, platelets 209.  INR 1.4.  SARS COVID-19 negative.  Urine analysis 11-20 red cells, with no pyuria.  Toxicology screen negative.  Head CT, head angiography CT negative for acute changes.  Chest radiograph no acute infiltrate, remote rib fractures.  EKG 91 bpm, normal axis, normal intervals, sinus rhythm with PAC, no ST segment or T wave changes, low voltage.  Patient received antibiotic therapy with ceftriaxone for urinary tract infection, received neuro checks, PT, OT and speech therapy.  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  Neurology was consulted, recommendations to continue anticoagulation and full dose aspirin.  Assessment & Plan:   Principal Problem:   CVA (cerebral vascular accident) ([place]) Active Problems:   HTN (hypertension)   DM (diabetes mellitus), type 2 ([place]), NIDDM    Recurrent pulmonary embolism ([place])   Acute metabolic encephalopathy   1. Acute left temporal CVA, complicated with metabolic encephalopathy. Patient has been confused and agitated yesterday. Very poor sleep about 3 hours last night, this am another 3. Has received haldol as needed. Needs assistance with feedings. Somnolent this am.  At baseline patient able to hold a conversation and ambulates with a walker.   Will continue supportive medical care, with neuro checks and IV fluids. Continue aspirin and apixaban for now, continue blood pressure control, PT/OT evaluations.  Will continue with as needed haldol (decrease dose to 1 mg as needed q 4 H) and will add low dose olanzapine for tonight to improve sleep.   Patient continue to be at high risk for worsening encephalopathy.   2. Urine infection (present on admission). Will continue antibiotic therapy with IV ceftriaxone. Urine culture with 10,000 CFU of E Coli. Will plant short course of antibiotic therapy for 3 days.   3. T2DM/ dyslipidemia. Continue glucose control with insulin sliding scale. Continue with rosuvastatin.   4. GERD. Continue with pantoprazole.   5. Troponin elevation. No chest pain, no significant EKG changes, will follow on echocardiogram. No clinical signs of acute coronary syndrome./   Status is: Inpatient  Remains inpatient appropriate because:IV treatments appropriate due to intensity of illness or inability to take PO   Dispo:  Patient From: Home  Planned Disposition: To be determined  Expected discharge date: 07/29/19  Medically stable for discharge: No    DVT prophylaxis: apixaban   Code Status:   full  Family Communication:  I spoke with patient's son at the bedside, we talked in  detail about patient's condition, plan of care and prognosis and all questions were addressed.     Antimicrobials:   Ceftriaxone   Consults: Neurology and cardiology     Subjective: Patient is somnolent this am but  able to follow commands and answer simple questions, confused and disorientated.   Objective: Vitals:   07/26/19 1700 07/26/19 1900 07/26/19 2011 07/27/19 0457  BP: 138/74 105/69  (!) 167/55  Pulse: (!) 113 100  (!) 105  Resp: 20 20  20   Temp: 100 F (37.8 C) 100.3 F (37.9 C)  98.4 F (36.9 C)  TempSrc: Axillary Axillary  Axillary  SpO2: 92% 94% 92%   Weight:      Height:       No intake or output data in the 24 hours ending 07/27/19 1008 Filed Weights   07/26/19 0901  Weight: 78.7 kg    Examination:   General: Not in pain or dyspnea, deconditioned  Neurology: Awake and alert, non focal  E ENT: no pallor, no icterus, oral mucosa dry Cardiovascular: No JVD. S1-S2 present, rhythmic, no gallops, rubs, or murmurs. No lower extremity edema. Pulmonary: positive breath sounds bilaterally, adequate air movement, no wheezing, rhonchi or rales. Gastrointestinal. Abdomen with no organomegaly, non tender, no rebound or guarding Skin. No rashes Musculoskeletal: no joint deformities     Data Reviewed: I have personally reviewed following labs and imaging studies  CBC: Recent Labs  Lab 07/25/19 1707 07/25/19 1714 07/26/19 0406 07/27/19 0629  WBC 10.0  --  10.8* 7.8  NEUTROABS 7.4  --   --   --   HGB 11.9* 13.3 10.4* 9.9*  HCT 39.3 39.0 34.5* 32.6*  MCV 90.3  --  90.3 91.1  PLT 209  --  181 174   Basic Metabolic Panel: Recent Labs  Lab 07/25/19 1707 07/25/19 1714 07/26/19 0406 07/27/19 0629  NA 140 141 140 138  K 4.0 4.3 4.3 4.0  CL 101 103 105 103  CO2 25  --  25 25  GLUCOSE 170* 168* 168* 148*  BUN 33* 30* 23 13  CREATININE 1.33* 1.40* 1.06* 0.98  CALCIUM 9.6  --  8.8* 8.7*  MG  --   --   --  1.4*   GFR: Estimated Creatinine Clearance: 43 mL/min (by C-G formula based on SCr of 0.98 mg/dL). Liver Function Tests: Recent Labs  Lab 07/25/19 1707 07/27/19 0629  AST 28 45*  ALT 17 18  ALKPHOS 44 38  BILITOT 0.5 0.9  PROT 7.4 6.2*  ALBUMIN 4.3 3.5   No  results for input(s): LIPASE, AMYLASE in the last 168 hours. No results for input(s): AMMONIA in the last 168 hours. Coagulation Profile: Recent Labs  Lab 07/25/19 1707  INR 1.4*   Cardiac Enzymes: No results for input(s): CKTOTAL, CKMB, CKMBINDEX, TROPONINI in the last 168 hours. BNP (last 3 results) No results for input(s): PROBNP in the last 8760 hours. HbA1C: Recent Labs    07/25/19 1707  HGBA1C 7.0*   CBG: Recent Labs  Lab 07/25/19 1642 07/26/19 1136 07/26/19 1621 07/27/19 0759  GLUCAP 136* 150* 138* 137*   Lipid Profile: Recent Labs    07/27/19 0629  CHOL 124  HDL 60  LDLCALC 45  TRIG 94  CHOLHDL 2.1   Thyroid Function Tests: No results for input(s): TSH, T4TOTAL, FREET4, T3FREE, THYROIDAB in the last 72 hours. Anemia Panel: No results for input(s): VITAMINB12, FOLATE, FERRITIN, TIBC, IRON, RETICCTPCT in the last 72 hours.    Radiology Studies:  I have reviewed all of the imaging during this hospital visit personally     Scheduled Meds: . apixaban  5 mg Oral BID  . aspirin  81 mg Oral Daily  . insulin aspart  0-9 Units Subcutaneous TID WC  . pantoprazole  40 mg Oral Daily  . rosuvastatin  5 mg Oral Daily   Continuous Infusions: . sodium chloride 75 mL/hr at 07/27/19 0429  . cefTRIAXone (ROCEPHIN)  IV 1 g (07/26/19 2219)     LOS: 2 days        Kiyomi Pallo Gerome Apley, MD

## 2019-07-28 DIAGNOSIS — E1100 Type 2 diabetes mellitus with hyperosmolarity without nonketotic hyperglycemic-hyperosmolar coma (NKHHC): Secondary | ICD-10-CM

## 2019-07-28 DIAGNOSIS — W19XXXA Unspecified fall, initial encounter: Secondary | ICD-10-CM

## 2019-07-28 LAB — BASIC METABOLIC PANEL
Anion gap: 15 (ref 5–15)
BUN: 9 mg/dL (ref 8–23)
CO2: 26 mmol/L (ref 22–32)
Calcium: 9.2 mg/dL (ref 8.9–10.3)
Chloride: 101 mmol/L (ref 98–111)
Creatinine, Ser: 0.95 mg/dL (ref 0.44–1.00)
GFR calc Af Amer: >60 mL/min (ref >60)
GFR calc non Af Amer: 56 mL/min — ABNORMAL LOW (ref >60)
Glucose, Bld: 109 mg/dL — ABNORMAL HIGH (ref 70–99)
Potassium: 3.9 mmol/L (ref 3.5–5.1)
Sodium: 142 mmol/L (ref 135–145)

## 2019-07-28 LAB — CBC WITH DIFFERENTIAL/PLATELET
Abs Immature Granulocytes: 0.03 10*3/uL (ref 0.00–0.07)
Basophils Absolute: 0 10*3/uL (ref 0.0–0.1)
Basophils Relative: 0 %
Eosinophils Absolute: 0.1 10*3/uL (ref 0.0–0.5)
Eosinophils Relative: 1 %
HCT: 35.8 % — ABNORMAL LOW (ref 36.0–46.0)
Hemoglobin: 10.8 g/dL — ABNORMAL LOW (ref 12.0–15.0)
Immature Granulocytes: 0 %
Lymphocytes Relative: 17 %
Lymphs Abs: 1.2 10*3/uL (ref 0.7–4.0)
MCH: 27.2 pg (ref 26.0–34.0)
MCHC: 30.2 g/dL (ref 30.0–36.0)
MCV: 90.2 fL (ref 80.0–100.0)
Monocytes Absolute: 0.7 10*3/uL (ref 0.1–1.0)
Monocytes Relative: 10 %
Neutro Abs: 5.2 10*3/uL (ref 1.7–7.7)
Neutrophils Relative %: 72 %
Platelets: 183 10*3/uL (ref 150–400)
RBC: 3.97 MIL/uL (ref 3.87–5.11)
RDW: 21.5 % — ABNORMAL HIGH (ref 11.5–15.5)
WBC: 7.3 10*3/uL (ref 4.0–10.5)
nRBC: 0 % (ref 0.0–0.2)

## 2019-07-28 LAB — GLUCOSE, CAPILLARY
Glucose-Capillary: 77 mg/dL (ref 70–99)
Glucose-Capillary: 150 mg/dL — ABNORMAL HIGH (ref 70–99)
Glucose-Capillary: 129 mg/dL — ABNORMAL HIGH (ref 70–99)
Glucose-Capillary: 194 mg/dL — ABNORMAL HIGH (ref 70–99)

## 2019-07-28 LAB — URINE CULTURE: Culture: 10000 — AB

## 2019-07-28 MED ORDER — HALOPERIDOL LACTATE 5 MG/ML IJ SOLN: 0.5000 mg | Freq: Three times a day (TID) | INTRAMUSCULAR | Status: DC | PRN

## 2019-07-28 MED ORDER — GUAIFENESIN-DM 100-10 MG/5ML PO SYRP
5.0000 mL | ORAL_SOLUTION | ORAL | Status: DC | PRN
  Administered 2019-07-28 – 2019-07-29 (×2): 5 mL via ORAL
  Filled 2019-07-28 (×2): qty 5

## 2019-07-28 NOTE — Progress Notes (Cosign Needed)
SLP Cancellation Note  Patient Details Name: Brianna [facility] MRN: [id number] DOB: [birth year]   Cancelled treatment:       Reason Eval/Treat Not Completed: Fatigue/lethargy limiting ability to participate;Patient not medically ready. Pt continues to be inappropriate for speech/language eval at this time. ST will continue efforts.  Kerryn Tennant H. Roddie Mc, CCC-SLP Speech Language Pathologist    [name] 07/28/2019, 10:22 AM

## 2019-07-28 NOTE — Progress Notes (Signed)
[facility]                                                                              Patient Demographics  Brianna Burns, is a 85 y.o. female, DOB - 1934/05/16, WUJ:[id number]  Admit date - 07/25/2019   Admitting Physician Ejiroghene Arlyce Dice, MD  Outpatient Primary MD for the patient is Christain Sacramento, MD  Outpatient specialists:   LOS - 3  days   Medical records reviewed and are as summarized below:    Chief Complaint  Patient presents with  . Code Stroke       Brief summary   Patient admitted to the hospital with working diagnosis of metabolic encephalopathy.  85 year old female with past medical history for hypertension, type 2 diabetes mellitus, DVT/PEand ambulatory dysfunction who presented auscultation slurred speech.  Apparently patient was at her usual state of health, after 1 to 2 hours of been unattended she was found down on the ground, with altered mentation.  Her speech was slurred and the patient was confused.  On her initial physical examination her temperature was 98.4, respiratory rate 23, blood pressure 111/58, heart rate 87.  She had dry mucous membranes, her lungs are clear to auscultation bilaterally, heart S1-S2, present rhythmic, soft abdomen, no lower extremity edema.  She had a 0.5 cm skin laceration left knee.  She was nonfocal. Sodium 140, potassium 4.0, chloride 101, bicarb 25, glucose 170, BUN 33, creatinine 1 3, AST 28, ALT 17, troponin I 274.  White count 10.0, hemoglobin 11.9, hematocrit 39.3, platelets 209.  INR 1.4.  SARS COVID-19 negative.  Urine analysis 11-20 red cells, with no pyuria.  Toxicology screen negative.  Head CT, head angiography CT negative for acute changes.  Chest radiograph no acute infiltrate, remote rib fractures.  EKG 91 bpm, normal axis, normal intervals, sinus rhythm with PAC, no ST segment or T wave changes, low voltage.  Patient received antibiotic therapy with ceftriaxone for urinary tract infection,  received neuro checks, PT, OT and speech therapy.  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 Neurology was consulted, recommendations to continue anticoagulation and full dose aspirin.   Assessment & Plan    Principal Problem: Acute left temporal CVA (cerebral vascular accident) ([place]), complicated with metabolic encephalopathy, likely has underlying dementia with sundowning -Patient presented with confusion, agitation, very poor sleep, had received Haldol as needed inpatient, needing assistance with feedings. -Started on Zyprexa last night 2116, also received Haldol IM 1 mg at 2139, today very somnolent -Change Zyprexa to 8 PM nightly, changed Haldol to 0.5 mg every 8 hours as needed only for extreme agitation, discussed with RN and daughter at the bedside. -Patient has been seen by neurology, 2D echo showed EF of 60 to 65%, no WMA -CT angio head and neck showed no large vessel occlusion, negative perfusion study, no carotid or vertebral stenosis. -LDL 45, hemoglobin A1c 7.0, continue statin -Continue aspirin 325 mg daily for [redacted] weeks along with anticoagulation/Eliquis, 30-day cardiac event monitor upon discharge.  Active Problems:  UTI, E. Coli, POA -Patient has a history of recurrent UTIs, urine culture showed 10,000 CFU of  E. Coli -Patient has completed 3 days of IV Rocephin -Afebrile  Baseline gait impairment -Likely multifactorial, per neurology, mild parkinsonian, recommend avoid neuroleptics  Diabetes mellitus type 2, NIDDM, with neurological complications -Continue sliding scale insulin while inpatient -Hold Metformin    HTN (hypertension) -BP currently stable     Recurrent pulmonary embolism (HCC) -Continue Eliquis  GERD Continue PPI  Troponin elevation -Likely due to acute CVA, no chest pain, no significant EKG changes, 2D echo with EF of 60 to 65%, no wall motion abnormality. -Cardiology was consulted, seen on 6/16, recommended no  further inpatient cardiac work-up. -Consider outpatient follow-up, outpatient Lexiscan Myoview to further risk stratify, will need 30-day monitor at discharge.  Obesity Estimated body mass index is 31.73 kg/m as calculated from the following:   Height as of this encounter: 5\' 2"  (1.575 m).   Weight as of this encounter: 78.7 kg.  Code Status: Full CODE STATUS DVT Prophylaxis: Eliquis Family Communication: Discussed all imaging results, lab results, explained to the patient's daughter at the bedside   Disposition Plan:     Status is: Inpatient  Not inpatient appropriate, will call UM team and downgrade to OBS.   Dispo:  Patient From: Home  Planned Disposition: To be determined/skilled nursing facility.    Expected discharge date: 07/29/19  Medically stable for discharge: No, very somnolent today, mental status not close to baseline yet   Time Spent in minutes 25 minutes  Procedures:  CTA head and neck 2D echocardiogram  Consultants:   Neurology Cardiology  Antimicrobials:   Anti-infectives (From admission, onward)   Start     Dose/Rate Route Frequency Ordered Stop   07/25/19 2200  cefTRIAXone (ROCEPHIN) 1 g in sodium chloride 0.9 % 100 mL IVPB        1 g 200 mL/hr over 30 Minutes Intravenous Every 24 hours 07/25/19 2137 07/27/19 2157          Medications  Scheduled Meds: . apixaban  5 mg Oral BID  . aspirin  81 mg Oral Daily  . insulin aspart  0-9 Units Subcutaneous TID WC  . OLANZapine  2.5 mg Oral QHS  . pantoprazole  40 mg Oral Daily  . rosuvastatin  5 mg Oral Daily   Continuous Infusions: . lactated ringers 50 mL/hr at 07/28/19 0657   PRN Meds:.acetaminophen **OR** acetaminophen, haloperidol lactate, labetalol, ondansetron **OR** ondansetron (ZOFRAN) IV      Subjective:   Brianna Burns was seen and examined today.  Very somnolent, arousable but does not respond to questions or follow verbal commands.  Had received Zyprexa and Haldol last night.   Difficult to obtain review of system from the patient.  Daughter at the bedside.  Patient had not been sleeping well in the last few nights with 3 hours of sleep.  Objective:   Vitals:   07/27/19 0457 07/27/19 1431 07/27/19 2009 07/28/19 0522  BP: (!) 167/55 (!) 139/58 123/81 135/77  Pulse: (!) 105 (!) 102 84 99  Resp: 20 16 16 20   Temp: 98.4 F (36.9 C)  98.1 F (36.7 C) 99.7 F (37.6 C)  TempSrc: Axillary  Oral Oral  SpO2:  96% 95% 95%  Weight:      Height:        Intake/Output Summary (Last 24 hours) at 07/28/2019 0809 Last data filed at 07/28/2019 0400 Gross per 24 hour  Intake 716.53 ml  Output --  Net 716.53 ml     Wt Readings from Last 3 Encounters:  07/26/19 78.7  kg  04/10/19 68 kg  12/29/18 75.4 kg     Exam  General: Somnolent, arousable but does not follow verbal commands  Cardiovascular: S1 S2 auscultated, no murmurs, RRR  Respiratory: Clear to auscultation bilaterally, no wheezing, rales or rhonchi  Gastrointestinal: Soft, nontender, nondistended, + bowel sounds  Ext: no pedal edema bilaterally  Neuro: Difficult to assess  Musculoskeletal: No digital cyanosis, clubbing  Skin: No rashes  Psych: Somnolent   Data Reviewed:  I have personally reviewed following labs and imaging studies  Micro Results Recent Results (from the past 240 hour(s))  Culture, Urine     Status: Abnormal   Collection Time: 07/25/19  4:48 PM   Specimen: Urine, Random  Result Value Ref Range Status   Specimen Description   Final    URINE, RANDOM Performed at [facility], [address] [place], [place] [address]    Special Requests   Final    NONE Performed at [facility], [address], [place], [place] [address]    Culture 10,000 COLONIES/mL ESCHERICHIA COLI (A)  Final   Report Status 07/28/2019 FINAL  Final   Organism ID, Bacteria ESCHERICHIA COLI (A)  Final      Susceptibility   Escherichia coli - MIC*    AMPICILLIN >=32 RESISTANT Resistant     CEFAZOLIN  8 SENSITIVE Sensitive     CEFTRIAXONE <=0.25 SENSITIVE Sensitive     CIPROFLOXACIN <=0.25 SENSITIVE Sensitive     GENTAMICIN <=1 SENSITIVE Sensitive     IMIPENEM <=0.25 SENSITIVE Sensitive     NITROFURANTOIN <=16 SENSITIVE Sensitive     TRIMETH/SULFA <=20 SENSITIVE Sensitive     AMPICILLIN/SULBACTAM >=32 RESISTANT Resistant     PIP/TAZO 8 SENSITIVE Sensitive     * 10,000 COLONIES/mL ESCHERICHIA COLI  SARS Coronavirus 2 by RT PCR (hospital order, performed in [place] hospital lab) Nasopharyngeal Nasopharyngeal Swab     Status: None   Collection Time: 07/25/19 11:18 PM   Specimen: Nasopharyngeal Swab  Result Value Ref Range Status   SARS Coronavirus 2 NEGATIVE NEGATIVE Final    Comment: (NOTE) SARS-CoV-2 target nucleic acids are NOT DETECTED.  The SARS-CoV-2 RNA is generally detectable in upper and lower respiratory specimens during the acute phase of infection. The lowest concentration of SARS-CoV-2 viral copies this assay can detect is 250 copies / mL. A negative result does not preclude SARS-CoV-2 infection and should not be used as the sole basis for treatment or other patient management decisions.  A negative result may occur with improper specimen collection / handling, submission of specimen other than nasopharyngeal swab, presence of viral mutation(s) within the areas targeted by this assay, and inadequate number of viral copies (<250 copies / mL). A negative result must be combined with clinical observations, patient history, and epidemiological information.  Fact Sheet for Patients:   StrictlyIdeas.no  Fact Sheet for Healthcare Providers: BankingDealers.co.za  This test is not yet approved or  cleared by the [place] FDA and has been authorized for detection and/or diagnosis of SARS-CoV-2 by FDA under an Emergency Use Authorization (EUA).  This EUA will remain in effect (meaning this test can be used) for the  duration of the COVID-19 declaration under Section 564(b)(1) of the Act, 21 U.S.C. section 360bbb-3(b)(1), unless the authorization is terminated or revoked sooner.  Performed at [facility], [address] [place], [place] [address]   Culture, blood (routine x 2)     Status: None (Preliminary result)   Collection Time: 07/26/19  3:26 PM  Specimen: BLOOD RIGHT WRIST  Result Value Ref Range Status   Specimen Description BLOOD RIGHT WRIST  Final   Special Requests   Final    BOTTLES DRAWN AEROBIC AND ANAEROBIC Blood Culture adequate volume   Culture   Final    NO GROWTH 2 DAYS Performed at [facility], [address], [place], [place] [address]    Report Status PENDING  Incomplete    Radiology Reports CT ANGIO HEAD W OR WO CONTRAST  Result Date: 07/25/2019 CLINICAL DATA:  Dysarthria and confusion. EXAM: CT ANGIOGRAPHY HEAD AND NECK CT PERFUSION BRAIN TECHNIQUE: Multidetector CT imaging of the head and neck was performed using the standard protocol during bolus administration of intravenous contrast. Multiplanar CT image reconstructions and MIPs were obtained to evaluate the vascular anatomy. Carotid stenosis measurements (when applicable) are obtained utilizing NASCET criteria, using the distal internal carotid diameter as the denominator. Multiphase CT imaging of the brain was performed following IV bolus contrast injection. Subsequent parametric perfusion maps were calculated using RAPID software. CONTRAST:  168mL OMNIPAQUE IOHEXOL 350 MG/ML SOLN COMPARISON:  Head CT earlier same day. FINDINGS: CTA NECK FINDINGS Aortic arch: Mild aortic atherosclerosis. Right carotid system: Normal.  Tortuous vessels but no stenosis. Left carotid system: Normal tortuous vessels but no stenosis. Vertebral arteries: Both vertebral artery origins are widely patent. Both vertebral arteries are widely patent through the cervical region to the foramen magnum. Skeleton: Negative Other neck: No mass or adenopathy.  Upper chest: Negative Review of the MIP images confirms the above findings CTA HEAD FINDINGS Anterior circulation: Both internal carotid arteries are widely patent through the skull base and siphon regions. The anterior and middle cerebral vessels are patent. No large or medium vessel occlusion. No proximal stenosis. Posterior circulation: Both vertebral arteries widely patent to the basilar. No basilar stenosis. Posterior circulation branch vessels are normal. Fetal origin right PCA. Venous sinuses: Normal Anatomic variants: None Review of the MIP images confirms the above findings CT Brain Perfusion Findings: ASPECTS: 10 CBF (<30%) Volume: 32mL Perfusion (Tmax>6.0s) volume: 32mL Mismatch Volume: 55mL Infarction Location:None IMPRESSION: No large vessel occlusion.  Negative perfusion study. No carotid or vertebral stenosis. Tortuous vessels suggesting hypertension. These results were called by telephone at the time of interpretation on 07/25/2019 at 5:52 pm to provider [facility] ZAMMIT , who verbally acknowledged these results. Electronically Signed   By: Nelson Chimes M.D.   On: 07/25/2019 17:52   CT ANGIO NECK W OR WO CONTRAST  Result Date: 07/25/2019 CLINICAL DATA:  Dysarthria and confusion. EXAM: CT ANGIOGRAPHY HEAD AND NECK CT PERFUSION BRAIN TECHNIQUE: Multidetector CT imaging of the head and neck was performed using the standard protocol during bolus administration of intravenous contrast. Multiplanar CT image reconstructions and MIPs were obtained to evaluate the vascular anatomy. Carotid stenosis measurements (when applicable) are obtained utilizing NASCET criteria, using the distal internal carotid diameter as the denominator. Multiphase CT imaging of the brain was performed following IV bolus contrast injection. Subsequent parametric perfusion maps were calculated using RAPID software. CONTRAST:  18mL OMNIPAQUE IOHEXOL 350 MG/ML SOLN COMPARISON:  Head CT earlier same day. FINDINGS: CTA NECK FINDINGS Aortic  arch: Mild aortic atherosclerosis. Right carotid system: Normal.  Tortuous vessels but no stenosis. Left carotid system: Normal tortuous vessels but no stenosis. Vertebral arteries: Both vertebral artery origins are widely patent. Both vertebral arteries are widely patent through the cervical region to the foramen magnum. Skeleton: Negative Other neck: No mass or adenopathy. Upper chest: Negative Review of the MIP images  confirms the above findings CTA HEAD FINDINGS Anterior circulation: Both internal carotid arteries are widely patent through the skull base and siphon regions. The anterior and middle cerebral vessels are patent. No large or medium vessel occlusion. No proximal stenosis. Posterior circulation: Both vertebral arteries widely patent to the basilar. No basilar stenosis. Posterior circulation branch vessels are normal. Fetal origin right PCA. Venous sinuses: Normal Anatomic variants: None Review of the MIP images confirms the above findings CT Brain Perfusion Findings: ASPECTS: 10 CBF (<30%) Volume: 38mL Perfusion (Tmax>6.0s) volume: 65mL Mismatch Volume: 71mL Infarction Location:None IMPRESSION: No large vessel occlusion.  Negative perfusion study. No carotid or vertebral stenosis. Tortuous vessels suggesting hypertension. These results were called by telephone at the time of interpretation on 07/25/2019 at 5:52 pm to provider [facility] ZAMMIT , who verbally acknowledged these results. Electronically Signed   By: Nelson Chimes M.D.   On: 07/25/2019 17:52   MR BRAIN WO CONTRAST  Result Date: 07/25/2019 CLINICAL DATA:  Altered mental status. Slurred speech and confusion. EXAM: MRI HEAD WITHOUT CONTRAST TECHNIQUE: Multiplanar, multiecho pulse sequences of the brain and surrounding structures were obtained without intravenous contrast. COMPARISON:  CT studies same day FINDINGS: Brain: Severely motion degraded exam. Diffusion imaging only was obtained, and this is markedly suboptimal. One could question some  restricted diffusion in the left temporal lobe. This is not a reliable finding. Vascular: No data Skull and upper cervical spine: No data Sinuses/Orbits: No data Other: No data IMPRESSION: Severely motion degraded exam. Only diffusion imaging was achieved, and this is of very limited utility. One could question some restricted diffusion in the left temporal lobe, but this is not a reliable finding at this quality. Electronically Signed   By: Nelson Chimes M.D.   On: 07/25/2019 20:44   DG Pelvis Portable  Result Date: 07/25/2019 CLINICAL DATA:  Fall, found at bottom of steps EXAM: PORTABLE PELVIS 1-2 VIEWS COMPARISON:  None. FINDINGS: Contrast material within the urinary bladder obscures portions of the pelvic bones. The SI joints are non widened. The pubic symphysis is non widened. Inferior pubic rami appear intact. The femoral heads project in joint. IMPRESSION: No acute osseous abnormality Electronically Signed   By: Donavan Foil M.D.   On: 07/25/2019 18:53   CT CEREBRAL PERFUSION W CONTRAST  Result Date: 07/25/2019 CLINICAL DATA:  Dysarthria and confusion. EXAM: CT ANGIOGRAPHY HEAD AND NECK CT PERFUSION BRAIN TECHNIQUE: Multidetector CT imaging of the head and neck was performed using the standard protocol during bolus administration of intravenous contrast. Multiplanar CT image reconstructions and MIPs were obtained to evaluate the vascular anatomy. Carotid stenosis measurements (when applicable) are obtained utilizing NASCET criteria, using the distal internal carotid diameter as the denominator. Multiphase CT imaging of the brain was performed following IV bolus contrast injection. Subsequent parametric perfusion maps were calculated using RAPID software. CONTRAST:  155mL OMNIPAQUE IOHEXOL 350 MG/ML SOLN COMPARISON:  Head CT earlier same day. FINDINGS: CTA NECK FINDINGS Aortic arch: Mild aortic atherosclerosis. Right carotid system: Normal.  Tortuous vessels but no stenosis. Left carotid system: Normal  tortuous vessels but no stenosis. Vertebral arteries: Both vertebral artery origins are widely patent. Both vertebral arteries are widely patent through the cervical region to the foramen magnum. Skeleton: Negative Other neck: No mass or adenopathy. Upper chest: Negative Review of the MIP images confirms the above findings CTA HEAD FINDINGS Anterior circulation: Both internal carotid arteries are widely patent through the skull base and siphon regions. The anterior and middle cerebral vessels are patent.  No large or medium vessel occlusion. No proximal stenosis. Posterior circulation: Both vertebral arteries widely patent to the basilar. No basilar stenosis. Posterior circulation branch vessels are normal. Fetal origin right PCA. Venous sinuses: Normal Anatomic variants: None Review of the MIP images confirms the above findings CT Brain Perfusion Findings: ASPECTS: 10 CBF (<30%) Volume: 42mL Perfusion (Tmax>6.0s) volume: 38mL Mismatch Volume: 58mL Infarction Location:None IMPRESSION: No large vessel occlusion.  Negative perfusion study. No carotid or vertebral stenosis. Tortuous vessels suggesting hypertension. These results were called by telephone at the time of interpretation on 07/25/2019 at 5:52 pm to provider [facility] ZAMMIT , who verbally acknowledged these results. Electronically Signed   By: Nelson Chimes M.D.   On: 07/25/2019 17:52   DG Chest Port 1 View  Result Date: 07/25/2019 CLINICAL DATA:  Fall EXAM: PORTABLE CHEST 1 VIEW COMPARISON:  Radiograph 03/05/2017, 11/25/2017 FINDINGS: Low lung volumes with streaky areas of atelectatic change in central vascular crowding. More bandlike opacity present in the left lung base likely reflect further atelectasis or scarring. No focal consolidation or convincing features of edema. No pneumothorax or visible effusion. Cardiomediastinal contours are similar to prior accounting for differences in technique. Remote lower thoracic compression deformity and vertebroplasty  changes as well as several healed right posterolateral rib deformities. Multilevel degenerative changes are present in the imaged portions of the spine. No acute osseous or soft tissue abnormality. Telemetry leads overlie the chest. IMPRESSION: Low lung volumes with streaky areas of atelectatic change and central vascular crowding. More bandlike opacity present in the left lung base likely reflect further atelectasis or scarring. Remote rib fractures and a prior lower thoracic vertebroplasty. Similar to prior. Electronically Signed   By: Lovena Le M.D.   On: 07/25/2019 18:54   DG Knee Complete 4 Views Right  Result Date: 07/26/2019 CLINICAL DATA:  [place], found down EXAM: RIGHT KNEE - COMPLETE 4+ VIEW COMPARISON:  12/21/2018 FINDINGS: Frontal, bilateral oblique, lateral views of the right knee are obtained. Two cannulated screws traverse the patella consistent with previous fracture repair. No acute fracture, subluxation, or dislocation. Three compartmental osteoarthritis, with chondrocalcinosis in the medial and lateral compartments. No joint effusion. IMPRESSION: 1. No acute bony abnormality. 2. ORIF previous patellar fracture. 3. Three compartmental osteoarthritis. Electronically Signed   By: Randa Ngo M.D.   On: 07/26/2019 20:44   ECHOCARDIOGRAM COMPLETE  Result Date: 07/26/2019    ECHOCARDIOGRAM REPORT   Patient Name:   Brianna Burns Date of Exam: 07/26/2019 Medical Rec #:  [id number]       Height:       62.0 in Accession #:    [id number]      Weight:       173.5 lb Date of Birth:  [birth year]        BSA:          1.800 m Patient Age:    62 years        BP:           125/52 mmHg Patient Gender: F               HR:           103 bpm. Exam Location:  [facility] Procedure: 2D Echo Indications:    Elevated Troponin  History:        Patient has prior history of Echocardiogram examinations, most                 recent 12/25/2018. Risk Factors:Hypertension, Diabetes and  Non-Smoker. GERD,  Recurrent pulmonary embolism , UTI.  Sonographer:    Leavy Cella RDCS (AE) Referring Phys: [employer]  1. Left ventricular ejection fraction, by estimation, is 60 to 65%. The left ventricle has normal function. The left ventricle has no regional wall motion abnormalities. Left ventricular diastolic parameters are consistent with age-related delayed relaxation (normal).  2. Right ventricular systolic function is normal. The right ventricular size is normal. There is mildly elevated pulmonary artery systolic pressure.  3. The mitral valve is normal in structure. Trivial mitral valve regurgitation. No evidence of mitral stenosis.  4. The aortic valve is tricuspid. Aortic valve regurgitation is not visualized. Mild to moderate aortic valve sclerosis/calcification is present, without any evidence of aortic stenosis.  5. The inferior vena cava is normal in size with greater than 50% respiratory variability, suggesting right atrial pressure of 3 mmHg. FINDINGS  Left Ventricle: Left ventricular ejection fraction, by estimation, is 60 to 65%. The left ventricle has normal function. The left ventricle has no regional wall motion abnormalities. The left ventricular internal cavity size was normal in size. There is  no left ventricular hypertrophy. Left ventricular diastolic parameters are consistent with age-related delayed relaxation (normal). Right Ventricle: The right ventricular size is normal. No increase in right ventricular wall thickness. Right ventricular systolic function is normal. There is mildly elevated pulmonary artery systolic pressure. The tricuspid regurgitant velocity is 2.86  m/s, and with an assumed right atrial pressure of 10 mmHg, the estimated right ventricular systolic pressure is 37.3 mmHg. Left Atrium: Left atrial size was normal in size. Right Atrium: Right atrial size was normal in size. Pericardium: There is no evidence of pericardial effusion. Mitral Valve: The  mitral valve is normal in structure. Normal mobility of the mitral valve leaflets. Trivial mitral valve regurgitation. No evidence of mitral valve stenosis. Tricuspid Valve: The tricuspid valve is normal in structure. Tricuspid valve regurgitation is mild . No evidence of tricuspid stenosis. Aortic Valve: The aortic valve is tricuspid. Aortic valve regurgitation is not visualized. Mild to moderate aortic valve sclerosis/calcification is present, without any evidence of aortic stenosis. Pulmonic Valve: The pulmonic valve was normal in structure. Pulmonic valve regurgitation is not visualized. No evidence of pulmonic stenosis. Aorta: The aortic root is normal in size and structure. Venous: The inferior vena cava is normal in size with greater than 50% respiratory variability, suggesting right atrial pressure of 3 mmHg. IAS/Shunts: No atrial level shunt detected by color flow Doppler.  LEFT VENTRICLE PLAX 2D LVIDd:         4.21 cm  Diastology LVIDs:         2.71 cm  LV e' lateral:   10.70 cm/s LV PW:         1.05 cm  LV E/e' lateral: 8.0 LV IVS:        0.87 cm  LV e' medial:    6.74 cm/s LVOT diam:     1.80 cm  LV E/e' medial:  12.7 LVOT Area:     2.54 cm  RIGHT VENTRICLE RV S prime:     15.40 cm/s TAPSE (M-mode): 2.8 cm LEFT ATRIUM             Index       RIGHT ATRIUM          Index LA diam:        2.80 cm 1.56 cm/m  RA Area:     9.11 cm LA Vol (A2C):   63.9  ml 35.51 ml/m RA Volume:   16.40 ml 9.11 ml/m LA Vol (A4C):   42.6 ml 23.67 ml/m LA Biplane Vol: 54.6 ml 30.34 ml/m   AORTA Ao Root diam: 2.90 cm MITRAL VALVE               TRICUSPID VALVE MV Area (PHT): 3.53 cm    TR Peak grad:   32.7 mmHg MV Decel Time: 215 msec    TR Vmax:        286.00 cm/s MV E velocity: 85.90 cm/s MV A velocity: 59.20 cm/s  SHUNTS MV E/A ratio:  1.45        Systemic Diam: 1.80 cm Jenkins Rouge MD Electronically signed by Jenkins Rouge MD Signature Date/Time: 07/26/2019/12:09:47 PM    Final    CT HEAD CODE STROKE WO CONTRAST  Result  Date: 07/25/2019 CLINICAL DATA:  Code stroke.  Slurred speech. EXAM: CT HEAD WITHOUT CONTRAST TECHNIQUE: Contiguous axial images were obtained from the base of the skull through the vertex without intravenous contrast. COMPARISON:  12/24/2018 FINDINGS: Brain: Age related atrophy. Chronic small-vessel ischemic changes of the cerebral hemispheric white matter. No sign of acute infarction, mass lesion, hemorrhage, hydrocephalus or extra-axial collection. Vascular: There is atherosclerotic calcification of the major vessels at the base of the brain. Skull: Negative Sinuses/Orbits: Clear sinuses.  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 Other: None ASPECTS (Frank[place] Stroke Program Early CT Score) - Ganglionic level infarction (caudate, lentiform nuclei, internal capsule, insula, M1-M3 cortex): 7 - Supraganglionic infarction (M4-M6 cortex): 3 Total score (0-10 with 10 being normal): 10 IMPRESSION: 1. No acute finding by CT. Atrophy and chronic small-vessel ischemic changes of the white matter. 2. ASPECTS is 10 3. These results were called by telephone at the time of interpretation on 07/25/2019 at 5:12 pm to provider [facility] ZAMMIT , who verbally acknowledged these results. Electronically Signed   By: Nelson Chimes M.D.   On: 07/25/2019 17:12    Lab Data:  CBC: Recent Labs  Lab 07/25/19 1707 07/25/19 1714 07/26/19 0406 07/27/19 0629 07/28/19 0650  WBC 10.0  --  10.8* 7.8 7.3  NEUTROABS 7.4  --   --   --  PENDING  HGB 11.9* 13.3 10.4* 9.9* 10.8*  HCT 39.3 39.0 34.5* 32.6* 35.8*  MCV 90.3  --  90.3 91.1 90.2  PLT 209  --  181 157 161   Basic Metabolic Panel: Recent Labs  Lab 07/25/19 1707 07/25/19 1714 07/26/19 0406 07/27/19 0629 07/28/19 0650  NA 140 141 140 138 142  K 4.0 4.3 4.3 4.0 3.9  CL 101 103 105 103 101  CO2 25  --  25 25 26   GLUCOSE 170* 168* 168* 148* 109*  BUN 33* 30* 23 13 9   CREATININE 1.33* 1.40* 1.06* 0.98 0.95  CALCIUM 9.6  --  8.8* 8.7* 9.2  MG  --   --   --  1.4*  --     GFR: Estimated Creatinine Clearance: 44.3 mL/min (by C-G formula based on SCr of 0.95 mg/dL). Liver Function Tests: Recent Labs  Lab 07/25/19 1707 07/27/19 0629  AST 28 45*  ALT 17 18  ALKPHOS 44 38  BILITOT 0.5 0.9  PROT 7.4 6.2*  ALBUMIN 4.3 3.5   No results for input(s): LIPASE, AMYLASE in the last 168 hours. No results for input(s): AMMONIA in the last 168 hours. Coagulation Profile: Recent Labs  Lab 07/25/19 1707  INR 1.4*   Cardiac Enzymes: No results for input(s): CKTOTAL, CKMB, CKMBINDEX, TROPONINI in  the last 168 hours. BNP (last 3 results) No results for input(s): PROBNP in the last 8760 hours. HbA1C: Recent Labs    07/25/19 1707  HGBA1C 7.0*   CBG: Recent Labs  Lab 07/27/19 0759 07/27/19 1118 07/27/19 1657 07/27/19 2055 07/28/19 0752  GLUCAP 137* 149* 97 132* 77   Lipid Profile: Recent Labs    07/27/19 0629  CHOL 124  HDL 60  LDLCALC 45  TRIG 94  CHOLHDL 2.1   Thyroid Function Tests: No results for input(s): TSH, T4TOTAL, FREET4, T3FREE, THYROIDAB in the last 72 hours. Anemia Panel: No results for input(s): VITAMINB12, FOLATE, FERRITIN, TIBC, IRON, RETICCTPCT in the last 72 hours. Urine analysis:    Component Value Date/Time   COLORURINE YELLOW 07/25/2019 1648   APPEARANCEUR HAZY (A) 07/25/2019 1648   LABSPEC 1.041 (H) 07/25/2019 1648   PHURINE 5.0 07/25/2019 1648   GLUCOSEU NEGATIVE 07/25/2019 1648   HGBUR LARGE (A) 07/25/2019 1648   BILIRUBINUR NEGATIVE 07/25/2019 1648   KETONESUR NEGATIVE 07/25/2019 1648   PROTEINUR NEGATIVE 07/25/2019 1648   NITRITE POSITIVE (A) 07/25/2019 1648   LEUKOCYTESUR NEGATIVE 07/25/2019 1648     Brianna Burns M.D. [facility] 07/28/2019, 8:09 AM   Call night coverage person covering after 7pm

## 2019-07-28 NOTE — Care Management Important Message (Signed)
Important Message  Patient Details  Name: Brianna Burns MRN: [id number] Date of Birth: 1934/06/05   Medicare Important Message Given:  Yes     Tommy Medal 07/28/2019, 4:20 PM

## 2019-07-28 NOTE — Progress Notes (Signed)
**Note De-Identified  Obfuscation** EKG complete and placed in patient chart,

## 2019-07-29 ENCOUNTER — Inpatient Hospital Stay (HOSPITAL_COMMUNITY): Payer: Medicare Other

## 2019-07-29 LAB — BASIC METABOLIC PANEL
Anion gap: 11 (ref 5–15)
BUN: 10 mg/dL (ref 8–23)
CO2: 27 mmol/L (ref 22–32)
Calcium: 9.4 mg/dL (ref 8.9–10.3)
Chloride: 100 mmol/L (ref 98–111)
Creatinine, Ser: 0.89 mg/dL (ref 0.44–1.00)
GFR calc Af Amer: >60 mL/min (ref >60)
GFR calc non Af Amer: >60 mL/min (ref >60)
Glucose, Bld: 178 mg/dL — ABNORMAL HIGH (ref 70–99)
Potassium: 3.7 mmol/L (ref 3.5–5.1)
Sodium: 138 mmol/L (ref 135–145)

## 2019-07-29 LAB — GLUCOSE, CAPILLARY
Glucose-Capillary: 168 mg/dL — ABNORMAL HIGH (ref 70–99)
Glucose-Capillary: 167 mg/dL — ABNORMAL HIGH (ref 70–99)
Glucose-Capillary: 182 mg/dL — ABNORMAL HIGH (ref 70–99)
Glucose-Capillary: 178 mg/dL — ABNORMAL HIGH (ref 70–99)

## 2019-07-29 LAB — CBC
HCT: 36.1 % (ref 36.0–46.0)
Hemoglobin: 11.1 g/dL — ABNORMAL LOW (ref 12.0–15.0)
MCH: 27.4 pg (ref 26.0–34.0)
MCHC: 30.7 g/dL (ref 30.0–36.0)
MCV: 89.1 fL (ref 80.0–100.0)
Platelets: 194 10*3/uL (ref 150–400)
RBC: 4.05 MIL/uL (ref 3.87–5.11)
RDW: 21.4 % — ABNORMAL HIGH (ref 11.5–15.5)
WBC: 8.8 10*3/uL (ref 4.0–10.5)
nRBC: 0 % (ref 0.0–0.2)

## 2019-07-29 NOTE — Progress Notes (Signed)
   07/29/19 1500  Assess: MEWS Score  Temp (!) 100.8 F (38.2 C) (Simultaneous filing. User may not have seen previous data.)  BP 140/79  Pulse Rate (!) 105  Resp 20  Level of Consciousness Responds to Voice  SpO2 95 %  O2 Device Room Air  Assess: MEWS Score  MEWS Temp 1  MEWS Systolic 0  MEWS Pulse 1  MEWS RR 0  MEWS LOC 1  MEWS Score 3  MEWS Score Color Yellow  Assess: if the MEWS score is Yellow or Red  Were vital signs taken at a resting state? Yes  Focused Assessment Documented focused assessment  Early Detection of Sepsis Score *See Row Information* Low  MEWS guidelines implemented *See Row Information* Yes  Treat  MEWS Interventions Administered prn meds/treatments  Take Vital Signs  Increase Vital Sign Frequency  Yellow: Q 2hr X 2 then Q 4hr X 2, if remains yellow, continue Q 4hrs  Escalate  MEWS: Escalate Yellow: discuss with charge nurse/RN and consider discussing with provider and RRT  Notify: Charge Nurse/RN  Name of Charge Nurse/RN Notified Quillian Quince   Date Charge Nurse/RN Notified 07/29/19  Time Charge Nurse/RN Notified 1520  Notify: Provider  Provider Name/Title Dr. Nevada Crane  Date Provider Notified 07/29/19  Time Provider Notified 1520  Notification Type Page  Notification Reason Change in status  Response Other (Comment) (no new orders at this time)  Date of Provider Response  (waiting for response back )  awaiting for MD to page back. Will continue to monitor throughout shift.

## 2019-07-29 NOTE — Progress Notes (Signed)
Physical Therapy Treatment Patient Details Name: Brianna Burns MRN: [id number] DOB: 1934/07/06 Today's Date: 07/29/2019    History of Present Illness Brianna Burns is a 85 y.o. female with medical history significant for hypertension, diabetes mellitus, DVT and recurrent PE.Patient was brought to the ED via EMS with reports of slurred speech and unable to follow directions with confusion.  Patient was last seen normal by her daughter at about 2:30 PM.  Daughter was gone for about 1 to 2 hours and came back and found patient on the ground confused.  Patient's daughter reported that patient speech was both garbled and confused.  This is very different from patient's baseline.  At baseline she ambulates with a walker mostly when outside the home, but holds onto objects/the walls when ambulating inside the house.  She occasionally has some occasional short-term memory loss, but is able to hold a conversation and recognize family.She reports 2 episodes of loose stools today, but this is patient's baseline, and has been attributed to patient's Metformin.  No vomiting, no cough, no difficulty breathing.  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    PT Comments    Patient very restless and requires Max verbal/tactile cueing to participate with therapy.  Patient demonstrates slow labored movement for sitting up at bedside, once seated patient frequently leaning to the right with poor trunk control, poor carryover for completing exercises and limited to a few unsteady labored steps to transfer to chair.  Patient tolerated sitting up in chair after therapy with family member present in room - nursing staff notified use mechanical lift if necessary to put patient back to bed (lift harness in place in chair).  Patient will benefit from continued physical therapy in hospital and recommended venue below to increase strength, balance,  endurance for safe ADLs and gait.     Follow Up Recommendations  SNF;Supervision for mobility/OOB;Supervision/Assistance - 24 hour     Equipment Recommendations  None recommended by PT    Recommendations for Other Services       Precautions / Restrictions Precautions Precautions: Fall Restrictions Weight Bearing Restrictions: No    Mobility  Bed Mobility Overal bed mobility: Needs Assistance Bed Mobility: Supine to Sit;Sit to Supine     Supine to sit: Mod assist;Max assist     General bed mobility comments: slow labored movement  Transfers   Equipment used: Rolling walker (2 wheeled) Transfers: Sit to/from [facility] Sit to Stand: Mod assist;Max assist Stand pivot transfers: Mod assist;Max assist       General transfer comment: increased time, labored movement, unsteady on feet  Ambulation/Gait Ambulation/Gait assistance: Max assist Gait Distance (Feet): 4 Feet Assistive device: Rolling walker (2 wheeled) Gait Pattern/deviations: Decreased step length - right;Decreased step length - left;Decreased stride length Gait velocity: slow   General Gait Details: limited to 3-4 very unsteady labored side steps requiring Max asisstance to help move RW and preventing patient from losing balance   Stairs             Wheelchair Mobility    Modified Rankin (Stroke Patients Only)       Balance Overall balance assessment: Needs assistance Sitting-balance support: Feet supported;No upper extremity supported Sitting balance-Leahy Scale: Poor Sitting balance - Comments: fair/poor seated at EOB, frequent leaning to the right Postural control: Right lateral lean Standing balance support: During functional activity;Bilateral upper extremity supported Standing balance-Leahy Scale: Poor Standing balance comment:  using RW                            Cognition Arousal/Alertness: Awake/alert Behavior During Therapy:  Restless;Impulsive Overall Cognitive Status: Impaired/Different from baseline Area of Impairment: Orientation;Attention;Following commands                 Orientation Level: Person Current Attention Level: Selective   Following Commands: Follows one step commands with increased time;Follows multi-step commands inconsistently       General Comments: requires repeated verbal/tactile cueing to complete functional tasks      Exercises General Exercises - Lower Extremity Long Arc Quad: Seated;AAROM;Strengthening;Both;10 reps    General Comments        Pertinent Vitals/Pain Pain Assessment: Faces Faces Pain Scale: Hurts little more Pain Location: pressure to BUE/LE Pain Descriptors / Indicators: Grimacing;Guarding;Sore Pain Intervention(s): Limited activity within patient's tolerance;Monitored during session;Repositioned    Home Living                      Prior Function            PT Goals (current goals can now be found in the care plan section) Acute Rehab PT Goals Patient Stated Goal: return home PT Goal Formulation: With patient/family Time For Goal Achievement: 08/09/19 Potential to Achieve Goals: Good Progress towards PT goals: Progressing toward goals    Frequency    Min 3X/week      PT Plan Current plan remains appropriate    Co-evaluation              AM-PAC PT "6 Clicks" Mobility   Outcome Measure  Help needed turning from your back to your side while in a flat bed without using bedrails?: A Lot Help needed moving from lying on your back to sitting on the side of a flat bed without using bedrails?: A Lot Help needed moving to and from a bed to a chair (including a wheelchair)?: A Lot Help needed standing up from a chair using your arms (e.g., wheelchair or bedside chair)?: A Lot Help needed to walk in hospital room?: A Lot Help needed climbing 3-5 steps with a railing? : Total 6 Click Score: 11    End of Session   Activity  Tolerance: Patient tolerated treatment well;Patient limited by fatigue Patient left: in chair;with call bell/phone within reach;with chair alarm set;with family/visitor present Nurse Communication: Mobility status PT Visit Diagnosis: Unsteadiness on feet (R26.81);Other abnormalities of gait and mobility (R26.89);Muscle weakness (generalized) (M62.81)     Time: 4163-8453 PT Time Calculation (min) (ACUTE ONLY): 22 min  Charges:  $Therapeutic Activity: 8-22 mins                     1:16 PM, 07/29/19 Lonell Grandchild, MPT Physical Therapist with [facility] [phone] office [phone] mobile phone

## 2019-07-29 NOTE — Care Management (Signed)
Spoke with son regarding placement and communicated that patient has a bed at [facility]. Pt may DC once medically stable.
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
## 2019-07-30 ENCOUNTER — Inpatient Hospital Stay (HOSPITAL_COMMUNITY): Payer: Medicare Other

## 2019-07-30 LAB — BLOOD GAS, ARTERIAL
Acid-Base Excess: 4.7 mmol/L — ABNORMAL HIGH (ref 0.0–2.0)
Bicarbonate: 28.5 mmol/L — ABNORMAL HIGH (ref 20.0–28.0)
FIO2: 21
O2 Saturation: 95.4 %
Patient temperature: 37.8
pCO2 arterial: 43.2 mmHg (ref 32.0–48.0)
pH, Arterial: 7.439 (ref 7.350–7.450)
pO2, Arterial: 84 mmHg (ref 83.0–108.0)

## 2019-07-30 LAB — CBC WITH DIFFERENTIAL/PLATELET
Abs Immature Granulocytes: 0.03 10*3/uL (ref 0.00–0.07)
Basophils Absolute: 0 10*3/uL (ref 0.0–0.1)
Basophils Relative: 0 %
Eosinophils Absolute: 0 10*3/uL (ref 0.0–0.5)
Eosinophils Relative: 0 %
HCT: 36.7 % (ref 36.0–46.0)
Hemoglobin: 11.5 g/dL — ABNORMAL LOW (ref 12.0–15.0)
Immature Granulocytes: 0 %
Lymphocytes Relative: 12 %
Lymphs Abs: 1.1 10*3/uL (ref 0.7–4.0)
MCH: 28 pg (ref 26.0–34.0)
MCHC: 31.3 g/dL (ref 30.0–36.0)
MCV: 89.5 fL (ref 80.0–100.0)
Monocytes Absolute: 0.9 10*3/uL (ref 0.1–1.0)
Monocytes Relative: 10 %
Neutro Abs: 6.6 10*3/uL (ref 1.7–7.7)
Neutrophils Relative %: 78 %
Platelets: 220 10*3/uL (ref 150–400)
RBC: 4.1 MIL/uL (ref 3.87–5.11)
RDW: 21.2 % — ABNORMAL HIGH (ref 11.5–15.5)
WBC: 8.6 10*3/uL (ref 4.0–10.5)
nRBC: 0 % (ref 0.0–0.2)

## 2019-07-30 LAB — COMPREHENSIVE METABOLIC PANEL
ALT: 25 U/L (ref 0–44)
AST: 31 U/L (ref 15–41)
Albumin: 3.4 g/dL — ABNORMAL LOW (ref 3.5–5.0)
Alkaline Phosphatase: 61 U/L (ref 38–126)
Anion gap: 13 (ref 5–15)
BUN: 12 mg/dL (ref 8–23)
CO2: 25 mmol/L (ref 22–32)
Calcium: 9.1 mg/dL (ref 8.9–10.3)
Chloride: 97 mmol/L — ABNORMAL LOW (ref 98–111)
Creatinine, Ser: 0.88 mg/dL (ref 0.44–1.00)
GFR calc Af Amer: >60 mL/min (ref >60)
GFR calc non Af Amer: >60 mL/min (ref >60)
Glucose, Bld: 160 mg/dL — ABNORMAL HIGH (ref 70–99)
Potassium: 3.7 mmol/L (ref 3.5–5.1)
Sodium: 135 mmol/L (ref 135–145)
Total Bilirubin: 1.1 mg/dL (ref 0.3–1.2)
Total Protein: 7 g/dL (ref 6.5–8.1)

## 2019-07-30 LAB — GLUCOSE, CAPILLARY
Glucose-Capillary: 150 mg/dL — ABNORMAL HIGH (ref 70–99)
Glucose-Capillary: 209 mg/dL — ABNORMAL HIGH (ref 70–99)
Glucose-Capillary: 133 mg/dL — ABNORMAL HIGH (ref 70–99)
Glucose-Capillary: 105 mg/dL — ABNORMAL HIGH (ref 70–99)
Glucose-Capillary: 139 mg/dL — ABNORMAL HIGH (ref 70–99)

## 2019-07-30 LAB — PHOSPHORUS: Phosphorus: 2.9 mg/dL (ref 2.5–4.6)

## 2019-07-30 LAB — MAGNESIUM: Magnesium: 1.5 mg/dL — ABNORMAL LOW (ref 1.7–2.4)

## 2019-07-30 LAB — LACTIC ACID, PLASMA: Lactic Acid, Venous: 1.2 mmol/L (ref 0.5–1.9)

## 2019-07-30 LAB — PROCALCITONIN: Procalcitonin: <0.1 ng/mL

## 2019-07-30 MED ORDER — DEXTROSE IN LACTATED RINGERS 5 % IV SOLN: INTRAVENOUS | Status: DC
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
## 2019-07-30 NOTE — Progress Notes (Signed)
Patients family refusing for vital signs and neuro checks to be done at this time. Patient sleeping.

## 2019-07-30 NOTE — Progress Notes (Signed)
PROGRESS NOTE  ARTHI MCDONALD IPJ:[id number] DOB: [birth year] DOA: 07/25/2019 PCP: Christain Sacramento, MD  HPI/Recap of past 24 hours:  Patient admitted to the hospital with working diagnosis of metabolic encephalopathy.  85 year old female with past medical history for hypertension, type 2 diabetes mellitus, DVT/PEand ambulatory dysfunctionwho presented auscultation slurred speech. Apparently patient was at her usual state of health, after 1 to 2 hours of been unattended she was found down on the ground, with altered mentation. Her speech was slurred and the patient was confused. On her initial physical examination her temperature was 98.4, respiratory rate 23, blood pressure 111/58,heart rate 87. She had dry mucous membranes, her lungs are clear to auscultation bilaterally, heart S1-S2, present rhythmic, soft abdomen, no lower extremity edema. She had a 0.5 cm skin laceration left knee. She was nonfocal. Sodium 140, potassium 4.0, chloride 101, bicarb 25, glucose 170, BUN 33, creatinine 1 3, AST 28, ALT 17, troponin I 274. White count 10.0, hemoglobin 11.9, hematocrit 39.3, platelets 209. INR 1.4. SARS COVID-19 negative. Urine analysis 11-20 red cells, with no pyuria.Toxicology screen negative. Head CT, head angiography CT negative for acute changes. Chest radiograph no acute infiltrate, remote rib fractures. EKG 91 bpm, normal axis, normal intervals, sinus rhythm with PAC, no ST segment or T wave changes, low voltage.  Patient received antibiotic therapy with ceftriaxone for urinary tract infection, received neuro checks, PT, OT and speech therapy. 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 Neurology was consulted, recommendations to continue anticoagulation and 81 mg aspirin x 2 weeks, then stop ASA.  07/29/19: Seen and examined at her bedside with her son and daughter present.  Daughter and son concerned about her mentation and stated that she looks  like she typically does when she has an urinary tract infection.  To note patient was treated for E. coli UTI with Rocephin x3 days.  Repeated her urine culture, pending.  Patient's son requested left knee being imaged, ordered left knee x-ray which resulted negative for any acute findings.  PT has assessed and recommended SNF.  Awaiting speech assessment.  Too drowsy to complete study.  Appreciate TOC assistance with SNF placement.  07/30/19: Drowsy, ABG shows no evidence of hypoxia or CO2 retention on RA. CXR possible left lower atelectasis vs small effusion, infiltrates could not be excluded. Will obtain procalcitonin level and cbc with differntials.  We will also obtain CT head without contrast and ammonia level.  Assessment/Plan: Principal Problem:   CVA (cerebral vascular accident) ([place]) Active Problems:   HTN (hypertension)   DM (diabetes mellitus), type 2 ([place]), NIDDM   Recurrent pulmonary embolism (HCC)   Acute metabolic encephalopathy  Acute left temporal CVA (cerebral vascular accident) ([place]), complicated with metabolic encephalopathy, likely has underlying dementia with sundowning -Patient presented with confusion, agitation, very poor sleep, had received Haldol as needed inpatient. -Started on Zyprexa night of 07/27/19, doses were reduced. -Daughter and son concerned about her mentation and stated that she looks like she typically does when she has an urinary tract infection.  Urine culture repeated on 07/29/2019, pending.  Previously treated for E. coli UTI with 3 days of Rocephin. -Patient has been seen by neurology, 2D echo showed EF of 60 to 65%, no WMA, no PFO or thrombus.  -CT angio head and neck showed no large vessel occlusion, negative perfusion study, no carotid or vertebral stenosis. -LDL 45-at goal Hemoglobin A1c 7.0-goal less than 7.0 Continue aspirin and statin -Per neurology: Continue aspirin 81 mg daily  for [redacted] weeks along with anticoagulation/Eliquis then stop ASA 81 mg  daily -30-day cardiac event monitor upon discharge. -PT recommended SNF, TOC assisting with placement. -Pending speech evaluation, too drowsy to complete. -Aspiration and fall precautions  Acute metabolic encephalopathy, likely multifactorial Recent acute left temporal CVA Repeat CT head without contrast Obtain ammonia level Chest x-ray personally reviewed showed possible left lower atelectasis vs small effusion, infiltrates could not be excluded. Will obtain procalcitonin level and cbc with differntials.  N.p.o. until she is more alert Started D5 LR at 50 cc/h due to n.p.o. Follow results of CT head  Treated UTI, E. Coli, POA -Patient has a history of recurrent UTIs, urine culture showed 10,000 CFU of E. Coli -Patient has completed 3 days of IV Rocephin -Blood cultures negative to date, continue to follow. -Urine culture was repeated on 07/29/2019 due to family concern as stated above, pending. Continue to follow cultures  Baseline gait impairment post fall -Likely multifactorial, per neurology, mild parkinsonian, recommend avoid neuroleptics -Bilateral knee x-rays, no acute findings -PT has recommended SNF -CSW assisting with SNF placement  Diabetes mellitus type 2, NIDDM, with neurological complications -Hemoglobin A1c 7.0, goal less than 7.0 -Continue sliding scale insulin while inpatient -Continue to hold off oral hypoglycemics, Metformin   HTN (hypertension) -BP is at goal -Continue to monitor vital signs   Recurrent pulmonary embolism (HCC) -Continue Eliquis -O2 saturation 95% on room air  GERD Continue PPI  Troponin elevation -Likely due to acute CVA, no chest pain, no significant EKG changes, 2D echo with EF of 60 to 65%, no wall motion abnormality. -Cardiology was consulted, seen on 6/16, recommended no further inpatient cardiac work-up. -Consider outpatient follow-up, outpatient Lexiscan Myoview to further risk stratify, will need 30-day monitor at  discharge. -Social worker consulted to assist with cardiology referral  Obesity Estimated body mass index is 31.73 kg/m as calculated from the following:   Height as of this encounter: 5\' 2"  (1.575 m).   Weight as of this encounter: 78.7 kg.  Goals of care Palliative care team has been consulted to assist with establishing goals of care  Code Status: Full CODE STATUS DVT Prophylaxis: Eliquis Family Communication: Discussed all imaging results, lab results, explained to the patient's daughter at the bedside on 07/30/2019.  Also answered her son's questions on 07/29/2019 at bedside.   Disposition Plan:     Status is: Inpatient  Not inpatient appropriate, will call UM team and downgrade to OBS.   Dispo:             Patient From: Home             Planned Disposition: SNF             Expected discharge date: 07/31/19             Medically stable for discharge: Mental status not close to baseline.   Time Spent in minutes 25 minutes  Procedures:  CTA head and neck 2D echocardiogram  Consultants:   Neurology Cardiology     Objective: Vitals:   07/29/19 1948 07/30/19 0428 07/30/19 0828 07/30/19 1213  BP: 101/85 (!) 149/95 (!) 155/81 (!) 156/82  Pulse: 86 (!) 107 96 93  Resp: 16 20 20 18   Temp: 98.6 F (37 C) 98.7 F (37.1 C) 100 F (37.8 C) 98.9 F (37.2 C)  TempSrc: Oral Oral Oral Axillary  SpO2: 90% 99% 96% 97%  Weight:      Height:        Intake/Output  Summary (Last 24 hours) at 07/30/2019 1235 Last data filed at 07/29/2019 2200 Gross per 24 hour  Intake 396.04 ml  Output --  Net 396.04 ml   Filed Weights   07/26/19 0901  Weight: 78.7 kg    Exam:  . General: 85 y.o. year-old female well developed well nourished.  Drowsy. . Cardiovascular: Regular rate and rhythm with no rubs or gallops.  No thyromegaly or JVD noted.   Marland Kitchen Respiratory: Clear to auscultation with no wheezes or rales.  Poor inspiratory effort. .   Abdomen: Soft nontender  nondistended with normal bowel sounds x4 quadrants. . Musculoskeletal: No lower extremity edema. 2/4 pulses in all 4 extremities. Marland Kitchen Psychiatry: Unable to assess mood due to somnolence.   Data Reviewed: CBC: Recent Labs  Lab 07/25/19 1707 07/25/19 1707 07/25/19 1714 07/26/19 0406 07/27/19 0629 07/28/19 0650 07/29/19 0630  WBC 10.0  --   --  10.8* 7.8 7.3 8.8  NEUTROABS 7.4  --   --   --   --  5.2  --   HGB 11.9*   < > 13.3 10.4* 9.9* 10.8* 11.1*  HCT 39.3   < > 39.0 34.5* 32.6* 35.8* 36.1  MCV 90.3  --   --  90.3 91.1 90.2 89.1  PLT 209  --   --  181 157 183 194   < > = values in this interval not displayed.   Basic Metabolic Panel: Recent Labs  Lab 07/25/19 1707 07/25/19 1707 07/25/19 1714 07/26/19 0406 07/27/19 0629 07/28/19 0650 07/29/19 0630  NA 140   < > 141 140 138 142 138  K 4.0   < > 4.3 4.3 4.0 3.9 3.7  CL 101   < > 103 105 103 101 100  CO2 25  --   --  25 25 26 27   GLUCOSE 170*   < > 168* 168* 148* 109* 178*  BUN 33*   < > 30* 23 13 9 10   CREATININE 1.33*   < > 1.40* 1.06* 0.98 0.95 0.89  CALCIUM 9.6  --   --  8.8* 8.7* 9.2 9.4  MG  --   --   --   --  1.4*  --   --    < > = values in this interval not displayed.   GFR: Estimated Creatinine Clearance: 47.3 mL/min (by C-G formula based on SCr of 0.89 mg/dL). Liver Function Tests: Recent Labs  Lab 07/25/19 1707 07/27/19 0629  AST 28 45*  ALT 17 18  ALKPHOS 44 38  BILITOT 0.5 0.9  PROT 7.4 6.2*  ALBUMIN 4.3 3.5   No results for input(s): LIPASE, AMYLASE in the last 168 hours. No results for input(s): AMMONIA in the last 168 hours. Coagulation Profile: Recent Labs  Lab 07/25/19 1707  INR 1.4*   Cardiac Enzymes: No results for input(s): CKTOTAL, CKMB, CKMBINDEX, TROPONINI in the last 168 hours. BNP (last 3 results) No results for input(s): PROBNP in the last 8760 hours. HbA1C: No results for input(s): HGBA1C in the last 72 hours. CBG: Recent Labs  Lab 07/29/19 1117 07/29/19 1632  07/29/19 2118 07/30/19 0711 07/30/19 1102  GLUCAP 167* 182* 178* 150* 209*   Lipid Profile: No results for input(s): CHOL, HDL, LDLCALC, TRIG, CHOLHDL, LDLDIRECT in the last 72 hours. Thyroid Function Tests: No results for input(s): TSH, T4TOTAL, FREET4, T3FREE, THYROIDAB in the last 72 hours. Anemia Panel: No results for input(s): VITAMINB12, FOLATE, FERRITIN, TIBC, IRON, RETICCTPCT in the last 72 hours. Urine analysis:  Component Value Date/Time   COLORURINE YELLOW 07/25/2019 1648   APPEARANCEUR HAZY (A) 07/25/2019 1648   LABSPEC 1.041 (H) 07/25/2019 1648   PHURINE 5.0 07/25/2019 1648   GLUCOSEU NEGATIVE 07/25/2019 1648   HGBUR LARGE (A) 07/25/2019 1648   BILIRUBINUR NEGATIVE 07/25/2019 1648   KETONESUR NEGATIVE 07/25/2019 1648   PROTEINUR NEGATIVE 07/25/2019 1648   NITRITE POSITIVE (A) 07/25/2019 1648   LEUKOCYTESUR NEGATIVE 07/25/2019 1648   Sepsis Labs: @LABRCNTIP (procalcitonin:4,lacticidven:4)  ) Recent Results (from the past 240 hour(s))  Culture, Urine     Status: Abnormal   Collection Time: 07/25/19  4:48 PM   Specimen: Urine, Random  Result Value Ref Range Status   Specimen Description   Final    URINE, RANDOM Performed at [facility], [address], [place], [place] [address]    Special Requests   Final    NONE Performed at [facility], [address] [place], [place] [address]    Culture 10,000 COLONIES/mL ESCHERICHIA COLI (A)  Final   Report Status 07/28/2019 FINAL  Final   Organism ID, Bacteria ESCHERICHIA COLI (A)  Final      Susceptibility   Escherichia coli - MIC*    AMPICILLIN >=32 RESISTANT Resistant     CEFAZOLIN 8 SENSITIVE Sensitive     CEFTRIAXONE <=0.25 SENSITIVE Sensitive     CIPROFLOXACIN <=0.25 SENSITIVE Sensitive     GENTAMICIN <=1 SENSITIVE Sensitive     IMIPENEM <=0.25 SENSITIVE Sensitive     NITROFURANTOIN <=16 SENSITIVE Sensitive     TRIMETH/SULFA <=20 SENSITIVE Sensitive     AMPICILLIN/SULBACTAM >=32 RESISTANT Resistant      PIP/TAZO 8 SENSITIVE Sensitive     * 10,000 COLONIES/mL ESCHERICHIA COLI  SARS Coronavirus 2 by RT PCR (hospital order, performed in [facility] hospital lab) Nasopharyngeal Nasopharyngeal Swab     Status: None   Collection Time: 07/25/19 11:18 PM   Specimen: Nasopharyngeal Swab  Result Value Ref Range Status   SARS Coronavirus 2 NEGATIVE NEGATIVE Final    Comment: (NOTE) SARS-CoV-2 target nucleic acids are NOT DETECTED.  The SARS-CoV-2 RNA is generally detectable in upper and lower respiratory specimens during the acute phase of infection. The lowest concentration of SARS-CoV-2 viral copies this assay can detect is 250 copies / mL. A negative result does not preclude SARS-CoV-2 infection and should not be used as the sole basis for treatment or other patient management decisions.  A negative result may occur with improper specimen collection / handling, submission of specimen other than nasopharyngeal swab, presence of viral mutation(s) within the areas targeted by this assay, and inadequate number of viral copies (<250 copies / mL). A negative result must be combined with clinical observations, patient history, and epidemiological information.  Fact Sheet for Patients:   StrictlyIdeas.no  Fact Sheet for Healthcare Providers: BankingDealers.co.za  This test is not yet approved or  cleared by the [place] FDA and has been authorized for detection and/or diagnosis of SARS-CoV-2 by FDA under an Emergency Use Authorization (EUA).  This EUA will remain in effect (meaning this test can be used) for the duration of the COVID-19 declaration under Section 564(b)(1) of the Act, 21 U.S.C. section 360bbb-3(b)(1), unless the authorization is terminated or revoked sooner.  Performed at [facility], [address], [place], [place] [address]   Culture, blood (routine x 2)     Status: None (Preliminary result)   Collection Time: 07/26/19   3:26 PM   Specimen: BLOOD RIGHT WRIST  Result Value Ref Range Status  Specimen Description BLOOD RIGHT WRIST  Final   Special Requests   Final    BOTTLES DRAWN AEROBIC AND ANAEROBIC Blood Culture adequate volume   Culture   Final    NO GROWTH 3 DAYS Performed at [facility], [address], [place], [place] [address]    Report Status PENDING  Incomplete  Culture, Urine     Status: None (Preliminary result)   Collection Time: 07/29/19  9:39 AM   Specimen: Urine, Clean Catch  Result Value Ref Range Status   Specimen Description   Final    URINE, CLEAN CATCH Performed at [facility], [address], [place], [place] [address]    Special Requests   Final    NONE Performed at [facility], [place] [address] [place], [place] [address]    Culture PENDING  Incomplete   Report Status PENDING  Incomplete      Studies: No results found.  Scheduled Meds: . apixaban  5 mg Oral BID  . aspirin  81 mg Oral Daily  . insulin aspart  0-9 Units Subcutaneous TID WC  . OLANZapine  2.5 mg Oral QHS  . pantoprazole  40 mg Oral Daily  . rosuvastatin  5 mg Oral Daily    Continuous Infusions: . dextrose 5% lactated ringers       LOS: 5 days     Kayleen Memos, MD [facility] Pager [phone]  If 7PM-7AM, please contact night-coverage [email or web address] Password [email or web address] 07/30/2019, 12:35 PM

## 2019-07-31 ENCOUNTER — Inpatient Hospital Stay (HOSPITAL_COMMUNITY): Payer: Medicare Other

## 2019-07-31 ENCOUNTER — Encounter (HOSPITAL_COMMUNITY): Payer: Self-pay | Admitting: Internal Medicine

## 2019-07-31 ENCOUNTER — Other Ambulatory Visit: Payer: Self-pay

## 2019-07-31 DIAGNOSIS — W19XXXA Unspecified fall, initial encounter: Secondary | ICD-10-CM

## 2019-07-31 DIAGNOSIS — Z515 Encounter for palliative care: Secondary | ICD-10-CM

## 2019-07-31 DIAGNOSIS — W19XXXD Unspecified fall, subsequent encounter: Secondary | ICD-10-CM

## 2019-07-31 DIAGNOSIS — Z7189 Other specified counseling: Secondary | ICD-10-CM

## 2019-07-31 DIAGNOSIS — I499 Cardiac arrhythmia, unspecified: Secondary | ICD-10-CM

## 2019-07-31 LAB — FOLATE: Folate: 16.9 ng/mL (ref >5.9)

## 2019-07-31 LAB — T4, FREE: Free T4: 0.93 ng/dL (ref 0.61–1.12)

## 2019-07-31 LAB — GLUCOSE, CAPILLARY
Glucose-Capillary: 160 mg/dL — ABNORMAL HIGH (ref 70–99)
Glucose-Capillary: 170 mg/dL — ABNORMAL HIGH (ref 70–99)
Glucose-Capillary: 145 mg/dL — ABNORMAL HIGH (ref 70–99)
Glucose-Capillary: 186 mg/dL — ABNORMAL HIGH (ref 70–99)

## 2019-07-31 LAB — AMMONIA: Ammonia: 24 umol/L (ref 9–35)

## 2019-07-31 LAB — PROCALCITONIN: Procalcitonin: <0.1 ng/mL

## 2019-07-31 LAB — CULTURE, BLOOD (ROUTINE X 2)
Culture: NO GROWTH
Special Requests: ADEQUATE

## 2019-07-31 LAB — VITAMIN B12: Vitamin B-12: 1918 pg/mL — ABNORMAL HIGH (ref 180–914)

## 2019-07-31 LAB — TSH: TSH: 3.214 u[IU]/mL (ref 0.350–4.500)

## 2019-07-31 NOTE — Progress Notes (Signed)
Mews yellow r/t HR=101 and LOC = rousable to voice. Pt has fluctuated between drowsy and mild alertness all shift. MD notified, will continue to monitor.

## 2019-07-31 NOTE — Care Management Important Message (Signed)
Important Message  Patient Details  Name: Brianna Burns MRN: [id number] Date of Birth: 11-02-1934   Medicare Important Message Given:  Yes     Tommy Medal 07/31/2019, 2:25 PM

## 2019-07-31 NOTE — TOC Progression Note (Addendum)
Transition of Care [facility]) - Progression Note    Patient Details  Name: Brianna Burns MRN: [id number] Date of Birth: 25-Sep-1934  Transition of [facility]) CM/SW Contact  Shade Flood, LCSW Phone Number: 07/31/2019, 3:11 PM  Clinical Narrative:     Pt's insurance auth for [place] expires today. Attempted to extend the auth with [employer] but they indicated a new auth would need to be started including updated PT notes. Spoke with Jeneen Rinks from PT who indicated he will see pt and document first thing tomorrow AM. Once PT notes are in, TOC will follow up on new auth.  Updated Talbert Forest at [facility].   1537: [place] again to start new auth and was transferred to RN CM, Olean Ree. Provided verbal update and MaryBeth provided new auth of 6/22-6/24. Updated Talbert Forest. Updated MD for new covid test needed before dc.   Assigned TOC will follow up tomorrow.  1554: Following up on consult for TOC to arrange "loop recorder" for dc. Spoke with Dr. Carles Collet who clarifies that it should be a 30 day event monitor not a loop recorder. Spoke with RN Juliann Pulse in Cardiology and she has taken the referral information and the company that provides the event monitor will send it to pt at [facility] for their staff to connect. Updated Talbert Forest who states that they are able to accommodate this need. Requested from MD that the event monitor info be added to DC summary at dc.   Expected Discharge Plan: [place] Barriers to Discharge: Continued Medical Work up  Expected Discharge Plan and Services Expected Discharge Plan: [place] Choice: [place] arrangements for the past 2 months: Single Family Home                                       Social Determinants of Health (SDOH) Interventions    Readmission Risk Interventions No flowsheet data found.

## 2019-07-31 NOTE — Consult Note (Signed)
Consultation Note Date: 07/31/2019   Patient Name: Brianna Burns  DOB: [birth year]  MRN: [id number]  Age / Sex: 85 y.o., female  PCP: Brianna Sacramento, MD Referring Physician: Orson Eva, MD  Reason for Consultation: Establishing goals of care and Psychosocial/spiritual support  HPI/Patient Profile: 86 y.o. female  with past medical history of hypertension/high cholesterol, diabetes, history of DVT and PE, multiple falls, poor mobility, legally blind with prosthetic eye, per daughter 38 surgeries admitted on 07/25/2019 with acute left temporal CVA complicated with metabolic encephalopathy and likely underlying dementia.   Clinical Assessment and Goals of Care:  I have reviewed medical records including EPIC notes, labs and imaging, received report from attending, examined the patient and met at bedside with son and daughter, Aaron Edelman and [name] to discuss diagnosis prognosis, Abanda, EOL wishes, disposition and options.  I introduced Palliative Medicine as specialized medical care for people living with serious illness. It focuses on providing relief from the symptoms and stress of a serious illness.   We discussed a brief life review of the patient.  [name], "[name]", is married and lives in her family home with her husband who is in poor health.  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  Family describes [name] is very independent.  As far as functional and nutritional status, daughter, [name], lives a few houses down and assist with meals, although [name] can get her breakfast cereal together.  [name] is able to provide her own sink bath.  [name] is holding onto furniture as she walks through the home, uses her walker at times, but is experiencing falls.  We discussed her current illness and what it means in the larger context of her on-going co-morbidities.   Natural disease trajectory and expectations at EOL were discussed.  We talked in detail about [name]'s acute and chronic health concerns and history.  I attempted to elicit values and goals of care important to the patient.  Several times during our conversation daughter [name] states that [name] is "tired".  They state that she would return to [facility] for rehab if qualified, although they feel that she would not want to.  The difference between aggressive medical intervention and comfort care was considered in light of the patient's goals of care.   We talked about the concept of "treat the treatable, but allowing natural passing".  I share that just like I will get sick again, so we will [name].  I encourage family to consider about what this time looks like and feels like for her.  We talked about how to make choices for loved ones including 1) keeping her at the center of decision-making 2) are we doing something for her or to her, can we change what is happening 3) The person [name] was 10 years ago, how would that person tell them to care for her now.  We also talked about the concept of "let nature take its course".  I share an example of this.  Advanced directives, concepts specific to code status, artifical feeding and hydration, and rehospitalization were considered and discussed.  We talked about treat the treatable but allowing natural passing.  Son Aaron Edelman states that he believes his mother has elected DNR, but they do not want to change CODE STATUS until he has reviewed paperwork.  Hospice and Palliative Care services outpatient were explained and offered.  We talked about at home treat the treatable hospice care.  Questions and concerns were addressed.  The family was encouraged to call with questions or concerns.   Conference with attending, bedside nursing staff, transition of care team related to patient condition, needs, goals of care.  PMT to continue to  follow.  HCPOA    NEXT OF KIN - [name] has not completed formal power of attorney paperwork.  Her husband is still alive, but is in poor health, frail.  Her adult children [name] and Aaron Edelman make decisions as a team with their father, and mother when she is able.   SUMMARY OF RECOMMENDATIONS   At this point continue to treat the treatable. Family is accessing CODE STATUS documents, conversations to continue At this point open to returning to [facility] for further rehab if qualified We discussed at home "treat the treatable" hospice care after rehab.   Code Status/Advance Care Planning:  Full code -we talked about the idea of treat the treatable but allowing natural passing.  We talked about some statistics related to CPR.  I shared that the medical team recommends to allow a natural death, no life support.  Aaron Edelman states that he believes [name] has completed documents that state she would desire DNR status, and he will look for these papers.  Symptom Management:   Per hospitalist, no additional needs at this time.  Palliative Prophylaxis:   Frequent Pain Assessment and Turn Reposition  Additional Recommendations (Limitations, Scope, Preferences):  Full Scope Treatment  Psycho-social/Spiritual:   Desire for further Chaplaincy support:no  Additional Recommendations: Caregiving  Support/Resources and Education on Hospice  Prognosis:   Unable to determine, based on outcomes.  6 months or less would not be surprising based on chronic illness burden, frailty, poor functional status, advancing memory loss.  Discharge Planning: To be determined, based on outcomes.  At this point agreeable to return to [facility] for further rehab.      Primary Diagnoses: Present on Admission: . HTN (hypertension) . Recurrent pulmonary embolism ([place])   I have reviewed the medical record, interviewed the patient and family, and examined the patient. The following aspects are  pertinent.  Past Medical History:  Diagnosis Date  . Arthritis   . Blood transfusion    after hysterectomy  . Blood transfusion without reported diagnosis   . Colon polyps   . Complication of anesthesia   . Concussion    fell in street   . Deep vein thrombosis ([place])    right  leg 01/2011  . Diabetes mellitus without complication (HCC)    metformin 2 x a day   . GERD (gastroesophageal reflux disease)   . Hiatal hernia 02-2014   [facility]  . Hyperlipidemia    bad cholesterol elevated on pravastatin  . Hypertension   . Panic attack    in [facility] 2 weeks for this due to knee surgery   . PONV (postoperative nausea and vomiting)    severe  . Pulmonary emboli ([place]) 12/26/2017   from broken ribs-had clot in lungs-was placed  on Eliquis   Social History   Socioeconomic History  . Marital status: Married    Spouse name: Not on file  . Number of children: Not on file  . Years of education: Not on file  . Highest education level: Not on file  Occupational History  . Not on file  Tobacco Use  . Smoking status: Never Smoker  . Smokeless tobacco: Never Used  Vaping Use  . Vaping Use: Never used  Substance and Sexual Activity  . Alcohol use: No  . Drug use: No  . Sexual activity: Not on file  Other Topics Concern  . Not on file  Social History Narrative  . Not on file   Social Determinants of Health   Financial Resource Strain:   . Difficulty of Paying Living Expenses:   Food Insecurity:   . Worried About Charity fundraiser in the Last Year:   . Arboriculturist in the Last Year:   Transportation Needs:   . Film/video editor (Medical):   Marland Kitchen Lack of Transportation (Non-Medical):   Physical Activity:   . Days of Exercise per Week:   . Minutes of Exercise per Session:   Stress:   . Feeling of Stress :   Social Connections:   . Frequency of Communication with Friends and Family:   . Frequency of Social Gatherings with Friends and Family:   . Attends  Religious Services:   . Active Member of Clubs or Organizations:   . Attends Archivist Meetings:   Marland Kitchen Marital Status:    Family History  Problem Relation Age of Onset  . Ovarian cancer Sister   . CAD Sister   . Colon cancer Neg Hx   . Stomach cancer Neg Hx   . Colon polyps Neg Hx   . Rectal cancer Neg Hx   . Esophageal cancer Neg Hx   . Breast cancer Neg Hx    Scheduled Meds: . apixaban  5 mg Oral BID  . aspirin  81 mg Oral Daily  . insulin aspart  0-9 Units Subcutaneous TID WC  . pantoprazole  40 mg Oral Daily  . rosuvastatin  5 mg Oral Daily   Continuous Infusions: . dextrose 5% lactated ringers 50 mL/hr at 07/31/19 1335   PRN Meds:.acetaminophen **OR** acetaminophen, guaiFENesin-dextromethorphan, labetalol, ondansetron **OR** ondansetron (ZOFRAN) IV Medications Prior to Admission:  Prior to Admission medications   Medication Sig Start Date End Date Taking? Authorizing Provider  acetaminophen (TYLENOL) 650 MG CR tablet Take 650 mg by mouth in the morning and at bedtime.   Yes [provider]  apixaban (ELIQUIS) 5 MG TABS tablet Take 5 mg by mouth 2 (two) times daily.   Yes [provider]  Calcium-Vitamin D-Vitamin K (CALCIUM + D + K PO) Take 1 tablet by mouth in the morning and at bedtime.   Yes [provider]  Chlorpheniramine-DM (CORICIDIN COUGH/COLD) 4-30 MG TABS Take 1 tablet by mouth daily as needed (cough).   Yes [provider]  Cholecalciferol (DIALYVITE VITAMIN D 5000) 125 MCG (5000 UT) capsule Take 5,000 Units by mouth in the morning.    Yes [provider]  dextromethorphan (DELSYM) 30 MG/5ML liquid Take 15 mg by mouth as needed for cough.   Yes [provider]  ferrous sulfate 325 (65 FE) MG tablet Take 325 mg by mouth at bedtime.   Yes [provider]  guaiFENesin (MUCINEX) 600 MG 12 hr tablet Take 600 mg by mouth  2 (two) times daily as needed for cough or to loosen phlegm.   Yes [provider]  hydrOXYzine (ATARAX/VISTARIL) 25 MG tablet Take 1 tablet (25 mg total) by mouth 3 (three) times daily as needed for anxiety. Patient taking differently: Take 12.5 mg by mouth at bedtime.  12/30/18  Yes Sheth, Devam P, MD  imipramine (TOFRANIL) 25 MG tablet Take 25 mg by mouth at bedtime.   Yes [provider]  loperamide (IMODIUM A-D) 2 MG tablet Take 2 mg by mouth 4 (four) times daily as needed for diarrhea or loose stools.   Yes [provider]  losartan (COZAAR) 25 MG tablet Take 1 tablet (25 mg total) by mouth daily. Patient taking differently: Take 25 mg by mouth in the morning.  02/13/13  Yes Deneise Lever, MD  Menthol, Topical Analgesic, (ICY HOT EX) Apply 1 application topically daily as needed (back pain).   Yes [provider]  metFORMIN (GLUCOPHAGE) 1000 MG tablet Take 1,000 mg by mouth daily with breakfast.   Yes [provider]  metoprolol succinate (TOPROL XL) 25 MG 24 hr tablet Take 1 tablet (25 mg total) by mouth daily. 12/30/18 12/30/19 Yes Sheth, Devam P, MD  omeprazole (PRILOSEC) 40 MG capsule Take 1 capsule (40 mg total) by mouth daily. Patient taking differently: Take 40 mg by mouth in the morning.  02/13/13  Yes Deneise Lever, MD  rosuvastatin (CRESTOR) 10 MG tablet Take 5 mg by mouth in the morning.    Yes [provider]  sertraline (ZOLOFT) 25 MG tablet Take 25 mg by mouth in the morning.    Yes [provider]  traMADol (ULTRAM) 50 MG tablet Take 1 tablet (50 mg total) by mouth every 6 (six) hours as needed for moderate pain (pain level 4-6). Patient taking differently: Take 50 mg by mouth 2 (two) times daily as needed for moderate pain (pain level 4-6).  12/22/18  Yes Swinteck, Aaron Edelman, MD  vitamin B-12 (CYANOCOBALAMIN) 1000 MCG tablet Take 1,000 mcg by mouth in the morning.   Yes [provider]  Carboxymethylcellulose Sod PF (REFRESH PLUS) 0.5 % SOLN Place 1 drop into the left eye 3 (three)  times daily as needed (dry eye).     [provider]   Allergies  Allergen Reactions  . Ativan [Lorazepam] Other (See Comments)    Hallucinations   . Baclofen     Confusion, altered mental state   . Hydrocodone-Acetaminophen Diarrhea  . Other Nausea And Vomiting    general anesthesia  . Percocet [Oxycodone-Acetaminophen] Nausea And Vomiting   Review of Systems  Unable to perform ROS: Dementia    Physical Exam Vitals and nursing note reviewed.  Constitutional:      General: She is not in acute distress.    Appearance: She is obese. She is ill-appearing.  HENT:     Head: Normocephalic and atraumatic.     Mouth/Throat:     Mouth: Mucous membranes are moist.  Cardiovascular:     Rate and Rhythm: Normal rate.  Pulmonary:     Effort: Pulmonary effort is normal. No respiratory distress.  Abdominal:     Palpations: Abdomen is soft.  Musculoskeletal:        General: No swelling.  Skin:    General: Skin is warm and dry.  Neurological:     Mental Status: She is alert.     Comments: Oriented to self only at this time  Psychiatric:     Comments:  Calm,     Vital Signs: BP (!) 153/92 (BP Location: Left Arm)   Pulse (!) 101   Temp 98.6 F (37 C) (Oral)   Resp 16   Ht _0  (1.575 m)   Wt 78.7 kg   SpO2 96%   BMI 31.73 kg/m  Pain Scale: 0-10   Pain Score: 0-No pain   SpO2: SpO2: 96 % O2 Device:SpO2: 96 % O2 Flow Rate: .O2 Flow Rate (L/min): 3 L/min  IO: Intake/output summary:   Intake/Output Summary (Last 24 hours) at 07/31/2019 1343 Last data filed at 07/31/2019 6219 Gross per 24 hour  Intake 354.08 ml  Output 750 ml  Net -395.92 ml    LBM: Last BM Date: 07/28/19 Baseline Weight: Weight: 78.7 kg Most recent weight: Weight: 78.7 kg     Palliative Assessment/Data:   Flowsheet Rows     Most Recent Value  Intake Tab  Referral Department Hospitalist  Unit at Time of Referral Med/Surg Unit  Palliative Care Primary Diagnosis Neurology  Date  Notified 07/30/19  Palliative Care Type New Palliative care  Reason for referral Clarify Goals of Care  Date of Admission 07/25/19  Date first seen by Palliative Care 07/31/19  # of days Palliative referral response time 1 Day(s)  # of days IP prior to Palliative referral 5  Clinical Assessment  Palliative Performance Scale Score 20%  Pain Max last 24 hours Not able to report  Pain Min Last 24 hours Not able to report  Dyspnea Max Last 24 Hours Not able to report  Dyspnea Min Last 24 hours Not able to report  Psychosocial & Spiritual Assessment  Palliative Care Outcomes      Time In: 1120 Time Out: 1240 Time Total: 80 minutes  Greater than 50%  of this time was spent counseling and coordinating care related to the above assessment and plan.  Signed by: Drue Novel, NP   Please contact Palliative Medicine Team phone at [phone] for questions and concerns.  For individual provider: See Shea Evans

## 2019-07-31 NOTE — Evaluation (Signed)
Occupational Therapy Evaluation Patient Details Name: Brianna Burns MRN: [id number] DOB: 1934/07/12 Today's Date: 07/31/2019    History of Present Illness 85 y.o. female present to ED via EMS with slurred speech and AMS. MRI revealing questionable restricted diffusion in the left temporal lobe, but this is not a reliable. PMH including HTN, diabetes mellitus, DVT, and recurrent PE.    Clinical Impression   PTA, pt was living with her husband and performed BADLs and used RW for mobility; daughter supervising bathing and assisting with IADLs. Pt's daughter reports they would go on walks each day (i.e. to the mailbox or around the house) and that pt is very active. Pt currently requiring Max A for UB ADls, Total A for LB ADLs, and Max A for bed mobility. Pt presenting with decreased ROM, strength, balance, cognition, and activity tolerance. Facilitating BLE ROM exercises in preparation for mobility as well as to decrease pain. Pt would benefit from further acute OT to facilitate safe dc. Recommend dc to SNF for further OT to optimize safety, independence with ADLs, and return to PLOF.    Follow Up Recommendations  SNF    Equipment Recommendations  Other (comment) (Defer to next venue)    Recommendations for Other Services PT consult     Precautions / Restrictions Precautions Precautions: Fall Precaution Comments: Legally blind Restrictions Weight Bearing Restrictions: No      Mobility Bed Mobility Overal bed mobility: Needs Assistance Bed Mobility: Supine to Sit;Sit to Supine     Supine to sit: Max assist;HOB elevated Sit to supine: Max assist   General bed mobility comments: Max A to bring BLEs towards EOB and then elevate trunk. Once at EOB, pt able to maintain sitting balance. Pt with lateral lean to right bopth supine in bed as well as at EOB. Max A to return to supine  Transfers                 General transfer comment: Not attempted for safety and due to pain     Balance Overall balance assessment: Needs assistance Sitting-balance support: Feet supported;No upper extremity supported Sitting balance-Leahy Scale: Fair Sitting balance - Comments: able to maintain sitting balance at EOB Postural control: Right lateral lean                                 ADL either performed or assessed with clinical judgement   ADL Overall ADL's : Needs assistance/impaired Eating/Feeding: Minimal assistance;Sitting Eating/Feeding Details (indicate cue type and reason): Min A for support at elbow to bring cup of water to mouth Grooming: Wash/dry face;Minimal assistance;Bed level;Maximal assistance;Brushing hair Grooming Details (indicate cue type and reason): Min A to bring wash clothe to face with support at elbow. Max A to brush hair Upper Body Bathing: Maximal assistance;Sitting   Lower Body Bathing: Total assistance;Bed level   Upper Body Dressing : Maximal assistance;Sitting   Lower Body Dressing: Total assistance;Bed level                 General ADL Comments: Pt presenting with increased pain at movement of BLEs and LUE, poor balance, decreased activity tolerance, and decrease cognition.      Vision Baseline Vision/History: Legally blind;Wears glasses (Prosthetic R eye) Wears Glasses: At all times Patient Visual Report: No change from baseline       Perception     Praxis      Pertinent Vitals/Pain Pain Assessment: Faces Faces  Pain Scale: Hurts even more Pain Location: Movement of BLEs Pain Descriptors / Indicators: Grimacing;Guarding;Sore Pain Intervention(s): Limited activity within patient's tolerance;Monitored during session;Repositioned     Hand Dominance Right   Extremity/Trunk Assessment Upper Extremity Assessment Upper Extremity Assessment: LUE deficits/detail;RUE deficits/detail RUE Deficits / Details: Decreased groos and fine motor strength. Able to bring hand to face to scratch nose and wipe mouth.  Diffiiculty bringing cup of water to mouth and requiring support at elbow. RUE Coordination: decreased gross motor;decreased fine motor LUE Deficits / Details: Pain with ROM and noting increased edema.  LUE: Unable to fully assess due to pain LUE Coordination: decreased fine motor;decreased gross motor   Lower Extremity Assessment Lower Extremity Assessment: Defer to PT evaluation   Cervical / Trunk Assessment Cervical / Trunk Assessment: Kyphotic;Other exceptions Cervical / Trunk Exceptions: lateral neck flexion to right   Communication Communication Communication: HOH   Cognition Arousal/Alertness: Awake/alert Behavior During Therapy: WFL for tasks assessed/performed Overall Cognitive Status: Impaired/Different from baseline Area of Impairment: Orientation;Attention;Memory;Safety/judgement;Following commands;Awareness;Problem solving                 Orientation Level: Disoriented to;Situation;Time Current Attention Level: Sustained Memory: Decreased short-term memory Following Commands: Follows one step commands inconsistently;Follows one step commands with increased time Safety/Judgement: Decreased awareness of safety;Decreased awareness of deficits Awareness: Intellectual Problem Solving: Slow processing;Difficulty sequencing;Requires verbal cues General Comments: Pt hyperfocused on pain with movement. Able to distract her with conversation and then able to perform ROM without as much pain. Pt with a decreased awareness of deficits and stating "Aaron Edelman did you bring my crutches so I can walk out of here." Pt stating he is unable to sit at EOB because she can't "let her legs hang".   General Comments  Daughter and son present throughout session    Exercises Exercises: General Lower Extremity;Other exercises General Exercises - Lower Extremity Hip ABduction/ADduction: PROM;15 reps;Supine Hip Flexion/Marching: PROM;15 reps;Supine Other Exercises Other Exercises: Use of  music (Bon Jovi) to distract from pain during ROM exercises   Shoulder Instructions      Home Living Family/patient expects to be discharged to:: Private residence Living Arrangements: Spouse/significant other Available Help at Discharge: Family Type of Home: House Home Access: Ramped entrance     Home Layout: One level     Bathroom Shower/Tub: Teacher, early years/pre: Standard Bathroom Accessibility: Yes   Home Equipment: Grab bars - tub/shower;Tub bench;Bedside commode;Hand held shower head;Walker - 2 wheels;Wheelchair - manual          Prior Functioning/Environment Level of Independence: Needs assistance  Gait / Transfers Assistance Needed: Use of RW for short distances with family ADL's / Homemaking Assistance Needed: Pt performs BADLs; daughter supervises tub bathes with bench. Otherwise, pt performs sponge bathes while sitting on toilet. Family performs all IADLs            OT Problem List: Decreased strength;Decreased range of motion;Decreased activity tolerance;Impaired balance (sitting and/or standing);Decreased cognition;Decreased safety awareness;Decreased knowledge of use of DME or AE;Decreased knowledge of precautions;Pain;Impaired UE functional use      OT Treatment/Interventions: Self-care/ADL training;Therapeutic exercise;Energy conservation;DME and/or AE instruction;Therapeutic activities;Patient/family education    OT Goals(Current goals can be found in the care plan section) Acute Rehab OT Goals Patient Stated Goal: return home OT Goal Formulation: With patient Time For Goal Achievement: 08/14/19 Potential to Achieve Goals: Good  OT Frequency: Min 2X/week   Barriers to D/C:            Co-evaluation  AM-PAC OT "6 Clicks" Daily Activity     Outcome Measure Help from another person eating meals?: A Little Help from another person taking care of personal grooming?: A Little Help from another person toileting, which  includes using toliet, bedpan, or urinal?: Total Help from another person bathing (including washing, rinsing, drying)?: A Lot Help from another person to put on and taking off regular upper body clothing?: A Lot Help from another person to put on and taking off regular lower body clothing?: Total 6 Click Score: 12   End of Session Nurse Communication: Mobility status  Activity Tolerance: Patient limited by pain Patient left: in bed;with call bell/phone within reach;with family/visitor present  OT Visit Diagnosis: Unsteadiness on feet (R26.81);Other abnormalities of gait and mobility (R26.89);Muscle weakness (generalized) (M62.81);Pain Pain - Right/Left:  (bilateral) Pain - part of body: Leg                Time: 0823-0907 OT Time Calculation (min): 44 min Charges:  OT General Charges $OT Visit: 1 Visit OT Evaluation $OT Eval Moderate Complexity: 1 Mod OT Treatments $Self Care/Home Management : 23-37 mins  [place], OTR/L Acute Rehab Pager: [phone] Office: [place] 07/31/2019, 9:24 AM

## 2019-07-31 NOTE — Progress Notes (Signed)
°  Speech Language Pathology Treatment: Dysphagia  Patient Details Name: Brianna Burns MRN: [id number] DOB: Mar 25, 1934 Today's Date: 07/31/2019 Time: 9150-5697 SLP Time Calculation (min) (ACUTE ONLY): 34 min  Assessment / Plan / Recommendation Clinical Impression  Ongoing diagnostic dysphagia therapy provided; Pt with improved alertness today. Pt was sitting upright after OT treatment and demonstrated R labial droop and poor containment of secretions on the R which is seemingly worse than previously noted. Also note constant verbal output during treatment with non-sensical phrases and no awareness of word substitutions or topical maintenance. Pt consumed ice chips with poor awareness of bolus and some wet vocal quality noted when talking while ice chip was still in her mouth; with max cues to finish swallowing the ice chip Pt did swallow without overt s/sx of aspiration. She then consumed thin (diet ginger ale, refused water) via straw with a seemingly timely swallow trigger upon palpation of swallow and consecutive sips consumed without overt s/sx of aspiration. Puree trials were consumed with good AP mobility and a seemingly timely swallowing trigger with minimal oral residuals; when provided regular textures, Pt demonstrated prolonged mastication with significant anterior spillage of cracker and talking while chewing despite max verbal cues and moderate oral and R pocketing after the swallow which continued to fall out of the R side of her mouth. Despite Pt's swallow appearing to be functional, it is negatively compounded by her poor oral control, decreased oral containment of solids and cognitive impairment/lack of awareness. These risk factors increase her risk of aspiraiton from mild to moderate, further note Pt is also at risk for dehydration and malnutrition even with initiation of a diet. Recommend initiate D1/puree diet with thin liquids with precautions re: ONE bite at a time, followed by a straw  sip of thin liquids, ensure Pt is not talking while food/liquid is in her mouth. Recommend meds be crushed in puree. Above to RN and Palliative NP. ST will continue to follow    HPI HPI: Patient received antibiotic therapy with ceftriaxone for urinary tract infection, received neuro checks, PT, OT and speech therapy.  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 BSE requested due to suspicion for aspiration.      SLP Plan  Continue with current plan of care       Recommendations  Diet recommendations: Dysphagia 1 (puree);Thin liquid Liquids provided via: Straw Medication Administration: Crushed with puree Supervision: Staff to assist with self feeding;Full supervision/cueing for compensatory strategies;Trained caregiver to feed patient Compensations: Slow rate;Small sips/bites;Follow solids with liquid;Multiple dry swallows after each bite/sip Postural Changes and/or Swallow Maneuvers: Seated upright 90 degrees;Upright 30-60 min after meal                Oral Care Recommendations: Oral care BID Follow up Recommendations: Skilled Nursing facility SLP Visit Diagnosis: Dysphagia, unspecified (R13.10) Plan: Continue with current plan of care       Stavroula Rohde H. Roddie Mc, CCC-SLP Speech Language Pathologist   [name] 07/31/2019, 11:34 AM

## 2019-07-31 NOTE — Care Management Important Message (Signed)
Important Message  Patient Details  Name: Brianna Burns MRN: [id number] Date of Birth: 07-14-1934   Medicare Important Message Given:  Yes     Tommy Medal 07/31/2019, 4:49 PM

## 2019-07-31 NOTE — Progress Notes (Signed)
PROGRESS NOTE  Brianna Burns:[id number] DOB: [birth year] DOA: 07/25/2019 PCP: Christain Sacramento, MD  Brief History:  85 year old female with past medical history for hypertension, type 2 diabetes mellitus, DVT/PEand ambulatory dysfunctionwho presented auscultation slurred speech. Apparently patient was at her usual state of health, after 1 to 2 hours of been unattended she was found down on the ground, with altered mentation. Her speech was slurred and the patient was confused. On her initial physical examination her temperature was 98.4, respiratory rate 23, blood pressure 111/58,heart rate 87. She had dry mucous membranes, her lungs are clear to auscultation bilaterally, heart S1-S2, present rhythmic, soft abdomen, no lower extremity edema. She had a 0.5 cm skin laceration left knee. She was nonfocal. Sodium 140, potassium 4.0, chloride 101, bicarb 25, glucose 170, BUN 33, creatinine 1 3, AST 28, ALT 17, troponin I 274. White count 10.0, hemoglobin 11.9, hematocrit 39.3, platelets 209. INR 1.4. SARS COVID-19 negative. Urine analysis 11-20 red cells, with no pyuria.Toxicology screen negative. Head CT, head angiography CT negative for acute changes. Chest radiograph no acute infiltrate, remote rib fractures. EKG 91 bpm, normal axis, normal intervals, sinus rhythm with PAC, no ST segment or T wave changes, low voltage.  Patient received antibiotic therapy with ceftriaxone for urinary tract infection, received neuro checks, PT, OT and speech therapy. 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 Neurology was consulted, recommendations to continue anticoagulation and 81 mg aspirin x 2 weeks, then stop ASA.  07/29/19: Seen and examined at her bedside with her son and daughter present.  Daughter and son concerned about her mentation and stated that she looks like she typically does when she has an urinary tract infection.  To note patient was treated  for E. coli UTI with Rocephin x3 days.  Repeated her urine culture, pending.  Patient's son requested left knee being imaged, ordered left knee x-ray which resulted negative for any acute findings.  PT has assessed and recommended SNF.  Awaiting speech assessment.  Too drowsy to complete study.  Appreciate TOC assistance with SNF placement.  07/30/19: Drowsy, ABG shows no evidence of hypoxia or CO2 retention on RA. CXR possible left lower atelectasis vs small effusion, infiltrates could not be excluded. Will obtain procalcitonin level and cbc with differntials.  We will also obtain CT head without contrast and ammonia level.   6/21 /21: Pt more alert but with nonsensical speech.  She is unable to voice her needs or concerns.  Speech therapy eval validates language dysfunction and continued dysphagia and expressed concerns that pt will not be able to sustain herself with adequate po intake and is high risk for dehydration.  Family continues to have unrealistic expectations.  Advanced to dys 1 with thin liquids per speech  Assessment/Plan: Acute left temporalCVA (cerebral vascular accident) ([place]), complicated with metabolic encephalopathy, likely has underlying dementia with sundowning -Patient presented with confusion, agitation, very poor sleep, had received Haldol as needed inpatient. -Started on Zyprexa night of 07/27/19, doses were reduced. -Daughter and son concerned about her mentation and stated that she looks like she typically does when she has an urinary tract infection.  Urine culture repeated on 07/29/2019, pending.  Previously treated for E. coli UTI with 3 days of Rocephin. -Patient has been seen by neurology, 2D echo showed EF of 60 to 65%, noWMA, no PFO or thrombus.  -CT angio head and neck showed no large vessel occlusion, negative perfusion study,  no carotid or vertebralstenosis. -LDL 45-at goal -Hemoglobin A1c 7.0-goal less than 7.0 Continue aspirin and statin -Per neurology:  Continue aspirin 81 mg daily for [redacted] weeks along with anticoagulation/Eliquis then stop ASA 81 mg daily -30-day cardiac event monitor upon discharge. -PT recommended SNF, TOC assisting with placement. -Aspiration and fall precautions  Acute metabolic encephalopathy, likely multifactorial Recent acute left temporal CVA Repeat CT head without contrast Obtain ammonia level -check B12 -check folate -TSH/Free T4 -CT chest -PCT -continue IVF  Treated UTI, E. Coli, POA -Patient has a history of recurrent UTIs, urine culture showed 10,000 CFU of E. Coli -Patient has completed 3 days of IV Rocephin -Blood cultures negative to date, -Urine culture was repeated on 07/29/2019 due to family concern as stated above, pending. Continue to follow cultures  Baseline gait impairment post fall -Likely multifactorial, per neurology, mild parkinsonian, recommend avoid neuroleptics -Bilateral knee x-rays, no acute findings -PT has recommended SNF -CSW assisting with SNF placement  Diabetes mellitus type 2, NIDDM, with neurological complications -Hemoglobin A1c 7.0, goal less than 7.0 -Continue sliding scale insulin while inpatient -Continue to hold off oral hypoglycemics, Metformin  HTN (hypertension) -BP is at goal -Continue to monitor vital signs  Recurrent pulmonary embolism (HCC) -Continue Eliquis -O2 saturation 95% on room air  GERD Continue PPI  Troponin elevation -Likely due to acute CVA, no chest pain, no significant EKG changes, 2D echo with EF of 60 to 65%, no wall motion abnormality. -Cardiology was consulted, seen on 6/16, recommended no further inpatient cardiac work-up. -Consider outpatient follow-up, outpatient Lexiscan Myoview to further risk stratify, will need 30-day monitor at discharge. -Social worker consulted to assist with cardiology referral  Obesity Estimated body mass index is 31.73 kg/m as calculated from the following: Height as of this encounter:  5\' 2"  (1.575 m). Weight as of this encounter: 78.7 kg.  Goals of care Palliative care team has been consulted to assist with establishing goals of care  Code Status:Full CODE STATUS DVT Prophylaxis:Eliquis Family Communication:Discussed all imaging results, lab results, explained to the patient'sdaughter and son at the bedside on 07/31/2019.     Disposition Plan:Status is: Inpatient  Patient continues to have very poor po intake--high risk for dehydration and readmission.  Also dysphagia and acute encephalopathy have compounded issus  Dispo: Patient From: Home Planned Disposition: SNF Expected discharge date: 08/01/19 Medically stable for discharge: Mental status not close to baseline.   Time Spentin minutes45 minutes  Procedures: CTA head and neck 2D echocardiogram  Consultants: Neurology Cardiology             Subjective: Patient with nonsensical speech.  Difficult to understand.  ROS no possible.  No reports of vomiting, diarrhea, resp distress, uncontrolled pain  Objective: Vitals:   07/30/19 1213 07/30/19 1647 07/30/19 2207 07/31/19 0523  BP: (!) 156/82 132/66 (!) 122/56 (!) 153/92  Pulse: 93 95 95 (!) 101  Resp: 18 16 16 16   Temp: 98.9 F (37.2 C) 98.9 F (37.2 C) 98.9 F (37.2 C) 98.6 F (37 C)  TempSrc: Axillary Axillary Axillary Oral  SpO2: 97% 94% 95% 96%  Weight:      Height:        Intake/Output Summary (Last 24 hours) at 07/31/2019 1156 Last data filed at 07/31/2019 0528 Gross per 24 hour  Intake 354.08 ml  Output 750 ml  Net -395.92 ml   Weight change:  Exam:   General:  Pt is alert, follows commands appropriately, not in acute distress  HEENT: No icterus,  No thrush, No neck mass, [place]/AT  Cardiovascular: RRR, S1/S2, no rubs, no gallops  Respiratory: bibasilar crackles. Poor inspiratory effor  Abdomen: Soft/+BS, non tender, non distended, no  guarding  Extremities: trace LE edema, No lymphangitis, No petechiae, No rashes, no synovitis   Data Reviewed: I have personally reviewed following labs and imaging studies Basic Metabolic Panel: Recent Labs  Lab 07/26/19 0406 07/27/19 0629 07/28/19 0650 07/29/19 0630 07/30/19 1311  NA 140 138 142 138 135  K 4.3 4.0 3.9 3.7 3.7  CL 105 103 101 100 97*  CO2 25 25 26 27 25   GLUCOSE 168* 148* 109* 178* 160*  BUN 23 13 9 10 12   CREATININE 1.06* 0.98 0.95 0.89 0.88  CALCIUM 8.8* 8.7* 9.2 9.4 9.1  MG  --  1.4*  --   --  1.5*  PHOS  --   --   --   --  2.9   Liver Function Tests: Recent Labs  Lab 07/25/19 1707 07/27/19 0629 07/30/19 1311  AST 28 45* 31  ALT 17 18 25   ALKPHOS 44 38 61  BILITOT 0.5 0.9 1.1  PROT 7.4 6.2* 7.0  ALBUMIN 4.3 3.5 3.4*   No results for input(s): LIPASE, AMYLASE in the last 168 hours. No results for input(s): AMMONIA in the last 168 hours. Coagulation Profile: Recent Labs  Lab 07/25/19 1707  INR 1.4*   CBC: Recent Labs  Lab 07/25/19 1707 07/25/19 1714 07/26/19 0406 07/27/19 0629 07/28/19 0650 07/29/19 0630 07/30/19 1311  WBC 10.0   < > 10.8* 7.8 7.3 8.8 8.6  NEUTROABS 7.4  --   --   --  5.2  --  6.6  HGB 11.9*   < > 10.4* 9.9* 10.8* 11.1* 11.5*  HCT 39.3   < > 34.5* 32.6* 35.8* 36.1 36.7  MCV 90.3   < > 90.3 91.1 90.2 89.1 89.5  PLT 209   < > 181 157 183 194 220   < > = values in this interval not displayed.   Cardiac Enzymes: No results for input(s): CKTOTAL, CKMB, CKMBINDEX, TROPONINI in the last 168 hours. BNP: Invalid input(s): POCBNP CBG: Recent Labs  Lab 07/30/19 1436 07/30/19 1647 07/30/19 2204 07/31/19 0753 07/31/19 1124  GLUCAP 133* 105* 139* 160* 170*   HbA1C: No results for input(s): HGBA1C in the last 72 hours. Urine analysis:    Component Value Date/Time   COLORURINE YELLOW 07/25/2019 1648   APPEARANCEUR HAZY (A) 07/25/2019 1648   LABSPEC 1.041 (H) 07/25/2019 1648   PHURINE 5.0 07/25/2019 1648    GLUCOSEU NEGATIVE 07/25/2019 1648   HGBUR LARGE (A) 07/25/2019 1648   BILIRUBINUR NEGATIVE 07/25/2019 1648   KETONESUR NEGATIVE 07/25/2019 1648   PROTEINUR NEGATIVE 07/25/2019 1648   NITRITE POSITIVE (A) 07/25/2019 1648   LEUKOCYTESUR NEGATIVE 07/25/2019 1648   Sepsis Labs: @LABRCNTIP (procalcitonin:4,lacticidven:4) ) Recent Results (from the past 240 hour(s))  Culture, Urine     Status: Abnormal   Collection Time: 07/25/19  4:48 PM   Specimen: Urine, Random  Result Value Ref Range Status   Specimen Description   Final    URINE, RANDOM Performed at [facility], [address], [place], [place] [address]    Special Requests   Final    NONE Performed at [facility], [address] [place], [place] [address]    Culture 10,000 COLONIES/mL ESCHERICHIA COLI (A)  Final   Report Status 07/28/2019 FINAL  Final   Organism ID, Bacteria ESCHERICHIA COLI (A)  Final  Susceptibility   Escherichia coli - MIC*    AMPICILLIN >=32 RESISTANT Resistant     CEFAZOLIN 8 SENSITIVE Sensitive     CEFTRIAXONE <=0.25 SENSITIVE Sensitive     CIPROFLOXACIN <=0.25 SENSITIVE Sensitive     GENTAMICIN <=1 SENSITIVE Sensitive     IMIPENEM <=0.25 SENSITIVE Sensitive     NITROFURANTOIN <=16 SENSITIVE Sensitive     TRIMETH/SULFA <=20 SENSITIVE Sensitive     AMPICILLIN/SULBACTAM >=32 RESISTANT Resistant     PIP/TAZO 8 SENSITIVE Sensitive     * 10,000 COLONIES/mL ESCHERICHIA COLI  SARS Coronavirus 2 by RT PCR (hospital order, performed in [place] hospital lab) Nasopharyngeal Nasopharyngeal Swab     Status: None   Collection Time: 07/25/19 11:18 PM   Specimen: Nasopharyngeal Swab  Result Value Ref Range Status   SARS Coronavirus 2 NEGATIVE NEGATIVE Final    Comment: (NOTE) SARS-CoV-2 target nucleic acids are NOT DETECTED.  The SARS-CoV-2 RNA is generally detectable in upper and lower respiratory specimens during the acute phase of infection. The lowest concentration of SARS-CoV-2 viral copies this  assay can detect is 250 copies / mL. A negative result does not preclude SARS-CoV-2 infection and should not be used as the sole basis for treatment or other patient management decisions.  A negative result may occur with improper specimen collection / handling, submission of specimen other than nasopharyngeal swab, presence of viral mutation(s) within the areas targeted by this assay, and inadequate number of viral copies (<250 copies / mL). A negative result must be combined with clinical observations, patient history, and epidemiological information.  Fact Sheet for Patients:   StrictlyIdeas.no  Fact Sheet for Healthcare Providers: BankingDealers.co.za  This test is not yet approved or  cleared by the [place] FDA and has been authorized for detection and/or diagnosis of SARS-CoV-2 by FDA under an Emergency Use Authorization (EUA).  This EUA will remain in effect (meaning this test can be used) for the duration of the COVID-19 declaration under Section 564(b)(1) of the Act, 21 U.S.C. section 360bbb-3(b)(1), unless the authorization is terminated or revoked sooner.  Performed at [facility], [address], [place], [place] [address]   Culture, blood (routine x 2)     Status: None   Collection Time: 07/26/19  3:26 PM   Specimen: BLOOD RIGHT WRIST  Result Value Ref Range Status   Specimen Description BLOOD RIGHT WRIST  Final   Special Requests   Final    BOTTLES DRAWN AEROBIC AND ANAEROBIC Blood Culture adequate volume   Culture   Final    NO GROWTH 5 DAYS Performed at [facility], [address], [place], [place] [address]    Report Status 07/31/2019 FINAL  Final  Culture, Urine     Status: None (Preliminary result)   Collection Time: 07/29/19  9:39 AM   Specimen: Urine, Clean Catch  Result Value Ref Range Status   Specimen Description   Final    URINE, CLEAN CATCH Performed at [facility], [address],  [place], [place] [address]    Special Requests NONE  Final   Culture   Final    CULTURE REINCUBATED FOR BETTER GROWTH Performed at [facility], [place] [address], [place], [place] [address]    Report Status PENDING  Incomplete     Scheduled Meds: . apixaban  5 mg Oral BID  . aspirin  81 mg Oral Daily  . insulin aspart  0-9 Units Subcutaneous TID WC  . pantoprazole  40 mg Oral  Daily  . rosuvastatin  5 mg Oral Daily   Continuous Infusions: . dextrose 5% lactated ringers 50 mL/hr at 07/30/19 1655    Procedures/Studies: CT ANGIO HEAD W OR WO CONTRAST  Result Date: 07/25/2019 CLINICAL DATA:  Dysarthria and confusion. EXAM: CT ANGIOGRAPHY HEAD AND NECK CT PERFUSION BRAIN TECHNIQUE: Multidetector CT imaging of the head and neck was performed using the standard protocol during bolus administration of intravenous contrast. Multiplanar CT image reconstructions and MIPs were obtained to evaluate the vascular anatomy. Carotid stenosis measurements (when applicable) are obtained utilizing NASCET criteria, using the distal internal carotid diameter as the denominator. Multiphase CT imaging of the brain was performed following IV bolus contrast injection. Subsequent parametric perfusion maps were calculated using RAPID software. CONTRAST:  174mL OMNIPAQUE IOHEXOL 350 MG/ML SOLN COMPARISON:  Head CT earlier same day. FINDINGS: CTA NECK FINDINGS Aortic arch: Mild aortic atherosclerosis. Right carotid system: Normal.  Tortuous vessels but no stenosis. Left carotid system: Normal tortuous vessels but no stenosis. Vertebral arteries: Both vertebral artery origins are widely patent. Both vertebral arteries are widely patent through the cervical region to the foramen magnum. Skeleton: Negative Other neck: No mass or adenopathy. Upper chest: Negative Review of the MIP images confirms the above findings CTA HEAD FINDINGS Anterior circulation: Both internal carotid arteries are widely patent through the skull base and  siphon regions. The anterior and middle cerebral vessels are patent. No large or medium vessel occlusion. No proximal stenosis. Posterior circulation: Both vertebral arteries widely patent to the basilar. No basilar stenosis. Posterior circulation branch vessels are normal. Fetal origin right PCA. Venous sinuses: Normal Anatomic variants: None Review of the MIP images confirms the above findings CT Brain Perfusion Findings: ASPECTS: 10 CBF (<30%) Volume: 84mL Perfusion (Tmax>6.0s) volume: 28mL Mismatch Volume: 29mL Infarction Location:None IMPRESSION: No large vessel occlusion.  Negative perfusion study. No carotid or vertebral stenosis. Tortuous vessels suggesting hypertension. These results were called by telephone at the time of interpretation on 07/25/2019 at 5:52 pm to provider [facility] ZAMMIT , who verbally acknowledged these results. Electronically Signed   By: Nelson Chimes M.D.   On: 07/25/2019 17:52   CT ANGIO NECK W OR WO CONTRAST  Result Date: 07/25/2019 CLINICAL DATA:  Dysarthria and confusion. EXAM: CT ANGIOGRAPHY HEAD AND NECK CT PERFUSION BRAIN TECHNIQUE: Multidetector CT imaging of the head and neck was performed using the standard protocol during bolus administration of intravenous contrast. Multiplanar CT image reconstructions and MIPs were obtained to evaluate the vascular anatomy. Carotid stenosis measurements (when applicable) are obtained utilizing NASCET criteria, using the distal internal carotid diameter as the denominator. Multiphase CT imaging of the brain was performed following IV bolus contrast injection. Subsequent parametric perfusion maps were calculated using RAPID software. CONTRAST:  160mL OMNIPAQUE IOHEXOL 350 MG/ML SOLN COMPARISON:  Head CT earlier same day. FINDINGS: CTA NECK FINDINGS Aortic arch: Mild aortic atherosclerosis. Right carotid system: Normal.  Tortuous vessels but no stenosis. Left carotid system: Normal tortuous vessels but no stenosis. Vertebral arteries: Both  vertebral artery origins are widely patent. Both vertebral arteries are widely patent through the cervical region to the foramen magnum. Skeleton: Negative Other neck: No mass or adenopathy. Upper chest: Negative Review of the MIP images confirms the above findings CTA HEAD FINDINGS Anterior circulation: Both internal carotid arteries are widely patent through the skull base and siphon regions. The anterior and middle cerebral vessels are patent. No large or medium vessel occlusion. No proximal stenosis. Posterior circulation: Both vertebral arteries widely patent to  the basilar. No basilar stenosis. Posterior circulation branch vessels are normal. Fetal origin right PCA. Venous sinuses: Normal Anatomic variants: None Review of the MIP images confirms the above findings CT Brain Perfusion Findings: ASPECTS: 10 CBF (<30%) Volume: 76mL Perfusion (Tmax>6.0s) volume: 63mL Mismatch Volume: 46mL Infarction Location:None IMPRESSION: No large vessel occlusion.  Negative perfusion study. No carotid or vertebral stenosis. Tortuous vessels suggesting hypertension. These results were called by telephone at the time of interpretation on 07/25/2019 at 5:52 pm to provider [facility] ZAMMIT , who verbally acknowledged these results. Electronically Signed   By: Nelson Chimes M.D.   On: 07/25/2019 17:52   MR BRAIN WO CONTRAST  Result Date: 07/25/2019 CLINICAL DATA:  Altered mental status. Slurred speech and confusion. EXAM: MRI HEAD WITHOUT CONTRAST TECHNIQUE: Multiplanar, multiecho pulse sequences of the brain and surrounding structures were obtained without intravenous contrast. COMPARISON:  CT studies same day FINDINGS: Brain: Severely motion degraded exam. Diffusion imaging only was obtained, and this is markedly suboptimal. One could question some restricted diffusion in the left temporal lobe. This is not a reliable finding. Vascular: No data Skull and upper cervical spine: No data Sinuses/Orbits: No data Other: No data IMPRESSION:  Severely motion degraded exam. Only diffusion imaging was achieved, and this is of very limited utility. One could question some restricted diffusion in the left temporal lobe, but this is not a reliable finding at this quality. Electronically Signed   By: Nelson Chimes M.D.   On: 07/25/2019 20:44   DG Pelvis Portable  Result Date: 07/25/2019 CLINICAL DATA:  Fall, found at bottom of steps EXAM: PORTABLE PELVIS 1-2 VIEWS COMPARISON:  None. FINDINGS: Contrast material within the urinary bladder obscures portions of the pelvic bones. The SI joints are non widened. The pubic symphysis is non widened. Inferior pubic rami appear intact. The femoral heads project in joint. IMPRESSION: No acute osseous abnormality Electronically Signed   By: Donavan Foil M.D.   On: 07/25/2019 18:53   CT CEREBRAL PERFUSION W CONTRAST  Result Date: 07/25/2019 CLINICAL DATA:  Dysarthria and confusion. EXAM: CT ANGIOGRAPHY HEAD AND NECK CT PERFUSION BRAIN TECHNIQUE: Multidetector CT imaging of the head and neck was performed using the standard protocol during bolus administration of intravenous contrast. Multiplanar CT image reconstructions and MIPs were obtained to evaluate the vascular anatomy. Carotid stenosis measurements (when applicable) are obtained utilizing NASCET criteria, using the distal internal carotid diameter as the denominator. Multiphase CT imaging of the brain was performed following IV bolus contrast injection. Subsequent parametric perfusion maps were calculated using RAPID software. CONTRAST:  125mL OMNIPAQUE IOHEXOL 350 MG/ML SOLN COMPARISON:  Head CT earlier same day. FINDINGS: CTA NECK FINDINGS Aortic arch: Mild aortic atherosclerosis. Right carotid system: Normal.  Tortuous vessels but no stenosis. Left carotid system: Normal tortuous vessels but no stenosis. Vertebral arteries: Both vertebral artery origins are widely patent. Both vertebral arteries are widely patent through the cervical region to the foramen  magnum. Skeleton: Negative Other neck: No mass or adenopathy. Upper chest: Negative Review of the MIP images confirms the above findings CTA HEAD FINDINGS Anterior circulation: Both internal carotid arteries are widely patent through the skull base and siphon regions. The anterior and middle cerebral vessels are patent. No large or medium vessel occlusion. No proximal stenosis. Posterior circulation: Both vertebral arteries widely patent to the basilar. No basilar stenosis. Posterior circulation branch vessels are normal. Fetal origin right PCA. Venous sinuses: Normal Anatomic variants: None Review of the MIP images confirms the above findings CT  Brain Perfusion Findings: ASPECTS: 10 CBF (<30%) Volume: 85mL Perfusion (Tmax>6.0s) volume: 22mL Mismatch Volume: 42mL Infarction Location:None IMPRESSION: No large vessel occlusion.  Negative perfusion study. No carotid or vertebral stenosis. Tortuous vessels suggesting hypertension. These results were called by telephone at the time of interpretation on 07/25/2019 at 5:52 pm to provider [facility] ZAMMIT , who verbally acknowledged these results. Electronically Signed   By: Nelson Chimes M.D.   On: 07/25/2019 17:52   DG CHEST PORT 1 VIEW  Result Date: 07/30/2019 CLINICAL DATA:  Altered mental status. EXAM: PORTABLE CHEST 1 VIEW COMPARISON:  July 25, 2019 FINDINGS: Rounded opacity projected near the left first costochondral junction was not present on the March 05, 2017 study. There is a small left effusion with underlying opacity, new since July 25, 2019. Healed right-sided rib fractures are again noted. No pneumothorax. The right lung is clear. The cardiomediastinal silhouette is stable. IMPRESSION: 1. The rounded opacity in the region of the left first costochondral junction may represent degenerative change in this region. An infiltrate or developing nodule are considered less likely. Recommend a PA and lateral chest x-ray before discharge to ensure resolution of this  finding. 2. Small left effusion with underlying atelectasis, new since July 25, 2019. 3. No other acute abnormalities. Electronically Signed   By: Dorise Bullion III M.D   On: 07/30/2019 13:28   DG Chest Port 1 View  Result Date: 07/25/2019 CLINICAL DATA:  Fall EXAM: PORTABLE CHEST 1 VIEW COMPARISON:  Radiograph 03/05/2017, 11/25/2017 FINDINGS: Low lung volumes with streaky areas of atelectatic change in central vascular crowding. More bandlike opacity present in the left lung base likely reflect further atelectasis or scarring. No focal consolidation or convincing features of edema. No pneumothorax or visible effusion. Cardiomediastinal contours are similar to prior accounting for differences in technique. Remote lower thoracic compression deformity and vertebroplasty changes as well as several healed right posterolateral rib deformities. Multilevel degenerative changes are present in the imaged portions of the spine. No acute osseous or soft tissue abnormality. Telemetry leads overlie the chest. IMPRESSION: Low lung volumes with streaky areas of atelectatic change and central vascular crowding. More bandlike opacity present in the left lung base likely reflect further atelectasis or scarring. Remote rib fractures and a prior lower thoracic vertebroplasty. Similar to prior. Electronically Signed   By: Lovena Le M.D.   On: 07/25/2019 18:54   DG Knee Complete 4 Views Left  Result Date: 07/29/2019 CLINICAL DATA:  Arthritis, LEFT knee pain.  Fall. EXAM: LEFT KNEE - COMPLETE 4+ VIEW COMPARISON:  None. FINDINGS: There is narrowing of the medial and lateral compartment. Mild osteophytosis. Patellofemoral spurring additionally. No joint effusion. No acute fracture dislocation. IMPRESSION: Tricompartmental osteoarthritis of the knee.  No acute findings. Electronically Signed   By: Suzy Bouchard M.D.   On: 07/29/2019 10:21   DG Knee Complete 4 Views Right  Result Date: 07/26/2019 CLINICAL DATA:  [place],  found down EXAM: RIGHT KNEE - COMPLETE 4+ VIEW COMPARISON:  12/21/2018 FINDINGS: Frontal, bilateral oblique, lateral views of the right knee are obtained. Two cannulated screws traverse the patella consistent with previous fracture repair. No acute fracture, subluxation, or dislocation. Three compartmental osteoarthritis, with chondrocalcinosis in the medial and lateral compartments. No joint effusion. IMPRESSION: 1. No acute bony abnormality. 2. ORIF previous patellar fracture. 3. Three compartmental osteoarthritis. Electronically Signed   By: Randa Ngo M.D.   On: 07/26/2019 20:44   ECHOCARDIOGRAM COMPLETE  Result Date: 07/26/2019    ECHOCARDIOGRAM REPORT  Patient Name:   Brianna Burns Date of Exam: 07/26/2019 Medical Rec #:  [id number]       Height:       62.0 in Accession #:    [id number]      Weight:       173.5 lb Date of Birth:  Sep 05, 1934        BSA:          1.800 m Patient Age:    15 years        BP:           125/52 mmHg Patient Gender: F               HR:           103 bpm. Exam Location:  [facility] Procedure: 2D Echo Indications:    Elevated Troponin  History:        Patient has prior history of Echocardiogram examinations, most                 recent 12/25/2018. Risk Factors:Hypertension, Diabetes and                 Non-Smoker. GERD, Recurrent pulmonary embolism , UTI.  Sonographer:    Leavy Cella RDCS (AE) Referring Phys: [place]  1. Left ventricular ejection fraction, by estimation, is 60 to 65%. The left ventricle has normal function. The left ventricle has no regional wall motion abnormalities. Left ventricular diastolic parameters are consistent with age-related delayed relaxation (normal).  2. Right ventricular systolic function is normal. The right ventricular size is normal. There is mildly elevated pulmonary artery systolic pressure.  3. The mitral valve is normal in structure. Trivial mitral valve regurgitation. No evidence of mitral stenosis.  4.  The aortic valve is tricuspid. Aortic valve regurgitation is not visualized. Mild to moderate aortic valve sclerosis/calcification is present, without any evidence of aortic stenosis.  5. The inferior vena cava is normal in size with greater than 50% respiratory variability, suggesting right atrial pressure of 3 mmHg. FINDINGS  Left Ventricle: Left ventricular ejection fraction, by estimation, is 60 to 65%. The left ventricle has normal function. The left ventricle has no regional wall motion abnormalities. The left ventricular internal cavity size was normal in size. There is  no left ventricular hypertrophy. Left ventricular diastolic parameters are consistent with age-related delayed relaxation (normal). Right Ventricle: The right ventricular size is normal. No increase in right ventricular wall thickness. Right ventricular systolic function is normal. There is mildly elevated pulmonary artery systolic pressure. The tricuspid regurgitant velocity is 2.86  m/s, and with an assumed right atrial pressure of 10 mmHg, the estimated right ventricular systolic pressure is 14.9 mmHg. Left Atrium: Left atrial size was normal in size. Right Atrium: Right atrial size was normal in size. Pericardium: There is no evidence of pericardial effusion. Mitral Valve: The mitral valve is normal in structure. Normal mobility of the mitral valve leaflets. Trivial mitral valve regurgitation. No evidence of mitral valve stenosis. Tricuspid Valve: The tricuspid valve is normal in structure. Tricuspid valve regurgitation is mild . No evidence of tricuspid stenosis. Aortic Valve: The aortic valve is tricuspid. Aortic valve regurgitation is not visualized. Mild to moderate aortic valve sclerosis/calcification is present, without any evidence of aortic stenosis. Pulmonic Valve: The pulmonic valve was normal in structure. Pulmonic valve regurgitation is not visualized. No evidence of pulmonic stenosis. Aorta: The aortic root is normal in size  and structure. Venous: The inferior vena  cava is normal in size with greater than 50% respiratory variability, suggesting right atrial pressure of 3 mmHg. IAS/Shunts: No atrial level shunt detected by color flow Doppler.  LEFT VENTRICLE PLAX 2D LVIDd:         4.21 cm  Diastology LVIDs:         2.71 cm  LV e' lateral:   10.70 cm/s LV PW:         1.05 cm  LV E/e' lateral: 8.0 LV IVS:        0.87 cm  LV e' medial:    6.74 cm/s LVOT diam:     1.80 cm  LV E/e' medial:  12.7 LVOT Area:     2.54 cm  RIGHT VENTRICLE RV S prime:     15.40 cm/s TAPSE (M-mode): 2.8 cm LEFT ATRIUM             Index       RIGHT ATRIUM          Index LA diam:        2.80 cm 1.56 cm/m  RA Area:     9.11 cm LA Vol (A2C):   63.9 ml 35.51 ml/m RA Volume:   16.40 ml 9.11 ml/m LA Vol (A4C):   42.6 ml 23.67 ml/m LA Biplane Vol: 54.6 ml 30.34 ml/m   AORTA Ao Root diam: 2.90 cm MITRAL VALVE               TRICUSPID VALVE MV Area (PHT): 3.53 cm    TR Peak grad:   32.7 mmHg MV Decel Time: 215 msec    TR Vmax:        286.00 cm/s MV E velocity: 85.90 cm/s MV A velocity: 59.20 cm/s  SHUNTS MV E/A ratio:  1.45        Systemic Diam: 1.80 cm Jenkins Rouge MD Electronically signed by Jenkins Rouge MD Signature Date/Time: 07/26/2019/12:09:47 PM    Final    CT HEAD CODE STROKE WO CONTRAST  Result Date: 07/25/2019 CLINICAL DATA:  Code stroke.  Slurred speech. EXAM: CT HEAD WITHOUT CONTRAST TECHNIQUE: Contiguous axial images were obtained from the base of the skull through the vertex without intravenous contrast. COMPARISON:  12/24/2018 FINDINGS: Brain: Age related atrophy. Chronic small-vessel ischemic changes of the cerebral hemispheric white matter. No sign of acute infarction, mass lesion, hemorrhage, hydrocephalus or extra-axial collection. Vascular: There is atherosclerotic calcification of the major vessels at the base of the brain. Skull: Negative Sinuses/Orbits: Clear sinuses.  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 Other: None ASPECTS ([facility] Stroke Program  Early CT Score) - Ganglionic level infarction (caudate, lentiform nuclei, internal capsule, insula, M1-M3 cortex): 7 - Supraganglionic infarction (M4-M6 cortex): 3 Total score (0-10 with 10 being normal): 10 IMPRESSION: 1. No acute finding by CT. Atrophy and chronic small-vessel ischemic changes of the white matter. 2. ASPECTS is 10 3. These results were called by telephone at the time of interpretation on 07/25/2019 at 5:12 pm to provider [facility] ZAMMIT , who verbally acknowledged these results. Electronically Signed   By: Nelson Chimes M.D.   On: 07/25/2019 17:12    Orson Eva, DO  [facility]  If 7PM-7AM, please contact night-coverage [email or web address] Password [facility] 07/31/2019, 11:56 AM   LOS: 6 days

## 2019-08-01 DIAGNOSIS — J690 Pneumonitis due to inhalation of food and vomit: Secondary | ICD-10-CM

## 2019-08-01 DIAGNOSIS — R338 Other retention of urine: Secondary | ICD-10-CM

## 2019-08-01 DIAGNOSIS — Z7189 Other specified counseling: Secondary | ICD-10-CM

## 2019-08-01 DIAGNOSIS — I639 Cerebral infarction, unspecified: Secondary | ICD-10-CM

## 2019-08-01 LAB — COMPREHENSIVE METABOLIC PANEL
ALT: 36 U/L (ref 0–44)
AST: 36 U/L (ref 15–41)
Albumin: 2.6 g/dL — ABNORMAL LOW (ref 3.5–5.0)
Alkaline Phosphatase: 70 U/L (ref 38–126)
Anion gap: 8 (ref 5–15)
BUN: 19 mg/dL (ref 8–23)
CO2: 27 mmol/L (ref 22–32)
Calcium: 8.8 mg/dL — ABNORMAL LOW (ref 8.9–10.3)
Chloride: 102 mmol/L (ref 98–111)
Creatinine, Ser: 0.99 mg/dL (ref 0.44–1.00)
GFR calc Af Amer: >60 mL/min (ref >60)
GFR calc non Af Amer: 53 mL/min — ABNORMAL LOW (ref >60)
Glucose, Bld: 178 mg/dL — ABNORMAL HIGH (ref 70–99)
Potassium: 3.4 mmol/L — ABNORMAL LOW (ref 3.5–5.1)
Sodium: 137 mmol/L (ref 135–145)
Total Bilirubin: 0.6 mg/dL (ref 0.3–1.2)
Total Protein: 6 g/dL — ABNORMAL LOW (ref 6.5–8.1)

## 2019-08-01 LAB — GLUCOSE, CAPILLARY
Glucose-Capillary: 157 mg/dL — ABNORMAL HIGH (ref 70–99)
Glucose-Capillary: 162 mg/dL — ABNORMAL HIGH (ref 70–99)
Glucose-Capillary: 150 mg/dL — ABNORMAL HIGH (ref 70–99)

## 2019-08-01 LAB — URINE CULTURE: Culture: 20000 — AB

## 2019-08-01 LAB — CBC
HCT: 33 % — ABNORMAL LOW (ref 36.0–46.0)
Hemoglobin: 10.1 g/dL — ABNORMAL LOW (ref 12.0–15.0)
MCH: 27.6 pg (ref 26.0–34.0)
MCHC: 30.6 g/dL (ref 30.0–36.0)
MCV: 90.2 fL (ref 80.0–100.0)
Platelets: 225 10*3/uL (ref 150–400)
RBC: 3.66 MIL/uL — ABNORMAL LOW (ref 3.87–5.11)
RDW: 20.4 % — ABNORMAL HIGH (ref 11.5–15.5)
WBC: 7.3 10*3/uL (ref 4.0–10.5)
nRBC: 0 % (ref 0.0–0.2)

## 2019-08-01 LAB — SARS CORONAVIRUS 2 BY RT PCR (HOSPITAL ORDER, PERFORMED IN ~~LOC~~ HOSPITAL LAB): SARS Coronavirus 2: NEGATIVE

## 2019-08-01 LAB — MAGNESIUM: Magnesium: 1.6 mg/dL — ABNORMAL LOW (ref 1.7–2.4)

## 2019-08-01 MED ORDER — POTASSIUM CHLORIDE 10 MEQ/100ML IV SOLN
10.0000 meq | INTRAVENOUS | Status: AC
  Administered 2019-08-01 (×2): 10 meq via INTRAVENOUS
  Filled 2019-08-01 (×2): qty 100

## 2019-08-01 MED ORDER — AMOXICILLIN-POT CLAVULANATE 400-57 MG/5ML PO SUSR: 800.0000 mg | Freq: Two times a day (BID) | ORAL | 0 refills | Status: AC

## 2019-08-01 MED ORDER — MAGNESIUM SULFATE 2 GM/50ML IV SOLN
2.0000 g | Freq: Once | INTRAVENOUS | Status: AC
  Administered 2019-08-01: 2 g via INTRAVENOUS
  Filled 2019-08-01: qty 50

## 2019-08-01 NOTE — Progress Notes (Signed)
Physical Therapy Treatment Patient Details Name: Brianna Burns MRN: [id number] DOB: Apr 25, 1934 Today's Date: 08/01/2019    History of Present Illness 85 y.o. female present to ED via EMS with slurred speech and AMS. MRI revealing questionable restricted diffusion in the left temporal lobe, but this is not a reliable. PMH including HTN, diabetes mellitus, DVT, and recurrent PE.     PT Comments    Patient presents with stiffness BLE requiring Mod assist to move legs during supine to sitting, once seated frequently leans to the right and has difficulty attempting to laterally flex neck to the left due to stiffness, able to take a few side steps to transfer to chair with Max assist due to limited use of hands to hold onto RW and buckling of knees.  Patient tolerated sitting up in chair with family member present in room after therapy - RN aware.  Patient will benefit from continued physical therapy in hospital and recommended venue below to increase strength, balance, endurance for safe ADLs and gait.    Follow Up Recommendations  SNF;Supervision for mobility/OOB;Supervision/Assistance - 24 hour     Equipment Recommendations  None recommended by PT    Recommendations for Other Services       Precautions / Restrictions Precautions Precautions: Fall Precaution Comments: Legally blind Restrictions Weight Bearing Restrictions: No    Mobility  Bed Mobility Overal bed mobility: Needs Assistance Bed Mobility: Supine to Sit     Supine to sit: Max assist;HOB elevated     General bed mobility comments: requires much time, has diffiuclty moving extremities secondary to c/o pain and stiffness  Transfers Overall transfer level: Needs assistance Equipment used: Rolling walker (2 wheeled) Transfers: Sit to/from [facility] Sit to Stand: Mod assist;Max assist Stand pivot transfers: Mod assist;Max assist          Ambulation/Gait Ambulation/Gait assistance: Max  assist Gait Distance (Feet): 3 Feet Assistive device: Rolling walker (2 wheeled) Gait Pattern/deviations: Decreased step length - right;Decreased step length - left;Decreased stride length Gait velocity: slow   General Gait Details: limited to 3=-4 unsteady labored side steps with poor carryover for holding onto RW with hands, buckling of knees due weakness after a couple of steps   Stairs             Wheelchair Mobility    Modified Rankin (Stroke Patients Only)       Balance Overall balance assessment: Needs assistance Sitting-balance support: Feet supported;No upper extremity supported Sitting balance-Leahy Scale: Poor Sitting balance - Comments: fair/poor with frequent leaning to the right Postural control: Right lateral lean Standing balance support: During functional activity;Bilateral upper extremity supported Standing balance-Leahy Scale: Poor Standing balance comment: using RW                            Cognition Arousal/Alertness: Awake/alert Behavior During Therapy: WFL for tasks assessed/performed Overall Cognitive Status: Impaired/Different from baseline Area of Impairment: Orientation;Attention;Safety/judgement;Following commands;Problem solving                 Orientation Level: Disoriented to;Situation;Time     Following Commands: Follows one step commands inconsistently;Follows one step commands with increased time     Problem Solving: Slow processing;Difficulty sequencing;Requires verbal cues        Exercises General Exercises - Lower Extremity Long Arc Quad: Seated;AAROM;Strengthening;Both;10 reps    General Comments        Pertinent Vitals/Pain Pain Assessment: Faces Faces Pain Scale: Hurts little more  Pain Location: movement/pressure to extremities Pain Descriptors / Indicators: Grimacing;Guarding;Sore Pain Intervention(s): Limited activity within patient's tolerance;Monitored during session    Home Living                       Prior Function            PT Goals (current goals can now be found in the care plan section) Acute Rehab PT Goals Patient Stated Goal: return home PT Goal Formulation: With patient/family Time For Goal Achievement: 08/09/19 Potential to Achieve Goals: Good Progress towards PT goals: Progressing toward goals    Frequency    Min 3X/week      PT Plan Current plan remains appropriate    Co-evaluation              AM-PAC PT "6 Clicks" Mobility   Outcome Measure  Help needed turning from your back to your side while in a flat bed without using bedrails?: A Lot Help needed moving from lying on your back to sitting on the side of a flat bed without using bedrails?: A Lot Help needed moving to and from a bed to a chair (including a wheelchair)?: A Lot Help needed standing up from a chair using your arms (e.g., wheelchair or bedside chair)?: A Lot Help needed to walk in hospital room?: A Lot Help needed climbing 3-5 steps with a railing? : Total 6 Click Score: 11    End of Session   Activity Tolerance: Patient tolerated treatment well;Patient limited by fatigue;Patient limited by pain Patient left: in chair;with call bell/phone within reach;with chair alarm set;with family/visitor present Nurse Communication: Mobility status PT Visit Diagnosis: Unsteadiness on feet (R26.81);Other abnormalities of gait and mobility (R26.89);Muscle weakness (generalized) (M62.81)     Time: 2482-5003 PT Time Calculation (min) (ACUTE ONLY): 29 min  Charges:  $Therapeutic Activity: 23-37 mins                     9:39 AM, 08/01/19 Lonell Grandchild, MPT Physical Therapist with [facility] [phone] office [phone] mobile phone

## 2019-08-01 NOTE — TOC Transition Note (Signed)
Transition of Care [facility]) - CM/SW Discharge Note   Patient Details  Name: Brianna Burns MRN: [id number] Date of Birth: April 01, 1934  [facility] CM/SW Contact:  Ihor Gully, LCSW Phone Number: 08/01/2019, 2:30 PM   Clinical Narrative:    Janee Morn at [facility] notified of discharge. Discharge clinicals sent to facility. TOC signing off.      Barriers to Discharge: No Barriers Identified   Patient Goals and CMS Choice     Choice offered to / list presented to : Adult Children  Discharge Placement              Patient chooses bed at: [facility] Patient to be transferred to facility by: [facility] Name of family member notified: Aaron Edelman, son Patient and family notified of of transfer: 08/01/19  Discharge Plan and Services     Post Acute Care Choice: [place]                               Social Determinants of Health (SDOH) Interventions     Readmission Risk Interventions No flowsheet data found.

## 2019-08-01 NOTE — Progress Notes (Signed)
Palliative: [name] is sitting up in the [facility] chair in her room asleep.  She does not respond to me when I call her name and gently touch her arm.  [name] is at bedside.  Brianna Burns and I talked in detail about, including but not limited to, Brianna Burns current labs and images, the treatment plan, disposition, social work/director of nursing at [facility] as they are "go to" for any issues or problems and also next steps.   We talked about physical therapy rehab, time for outcomes.  We also talked about aspiration pneumonia risks.  We also talked about urinary retention, in and out cath, risk for recurrent UTIs.  Brianna Burns states that they are open to in-home hospice care if/when Brianna Burns declines and her health.  He shares his concerns that Brianna Burns will not be able to walk again.   Brianna Burns and I talked about CODE STATUS.  Brianna Burns states that he and his father talked about CODE STATUS yesterday.  He shares that they both agree for DNR.  Brianna Burns states that he feels his Sister Brianna Burns may be reluctant for DNR.  He is request that No Name discussions continue with Brianna Burns.  We talked about outpatient palliative/hospice services, encouraged the service to continue talking about the "what if's and maybe's".  Conference with attending, bedside nursing staff, transition of care team related to patient condition, needs, goals of care, disposition.  Plan:   Discharged to [facility] for rehab.  Outpatient palliative services to follow.  Family is open to at home hospice when the time is ready.  Continue CODE STATUS discussions.  62 minutes Quinn Axe, NP Palliative Medicine Team Team Phone # [phone] Greater than 50% of this time was spent counseling and coordinating care related to the above assessment and plan.

## 2019-08-01 NOTE — Progress Notes (Signed)
Bladder scan reading 598 ml in bladder. In and out cath done. 752ml of amber, cloudy urine in return. MD notified. New orders to repeat and collect urine culture.

## 2019-08-01 NOTE — H&P (Addendum)
Physician Discharge Summary  Brianna Burns OEV:[id number] DOB: December 16, 1934 DOA: 07/25/2019  PCP: Christain Sacramento, MD  Admit date: 07/25/2019 Discharge date: 08/01/2019  Admitted From: Home Disposition:  SNF  Recommendations for Outpatient Follow-up:  1. Follow up with PCP in 1-2 weeks 2. Please obtain BMP/CBC in one week 3. Please place 30-day event monitor on patient once it arrives from cardiology office    Discharge Condition: Stable CODE STATUS: FULL Diet recommendation: dysphagia 1 with thin liquids  Brief History:  85 year old female with past medical history for hypertension, type 2 diabetes mellitus, DVT/PEand ambulatory dysfunctionwho presented auscultation slurred speech. Apparently patient was at her usual state of health, after 1 to 2 hours of been unattended she was found down on the ground, with altered mentation. Her speech was slurred and the patient was confused. On her initial physical examination her temperature was 98.4, respiratory rate 23, blood pressure 111/58,heart rate 87. She had dry mucous membranes, her lungs are clear to auscultation bilaterally, heart S1-S2, present rhythmic, soft abdomen, no lower extremity edema. She had a 0.5 cm skin laceration left knee. She was nonfocal. Sodium 140, potassium 4.0, chloride 101, bicarb 25, glucose 170, BUN 33, creatinine 1 3, AST 28, ALT 17, troponin I 274. White count 10.0, hemoglobin 11.9, hematocrit 39.3, platelets 209. INR 1.4. SARS COVID-19 negative. Urine analysis 11-20 red cells, with no pyuria.Toxicology screen negative. Head CT, head angiography CT negative for acute changes. Chest radiograph no acute infiltrate, remote rib fractures. EKG 91 bpm, normal axis, normal intervals, sinus rhythm with PAC, no ST segment or T wave changes, low voltage.  Patient received antibiotic therapy with ceftriaxone for urinary tract infection, received neuro checks, PT, OT and speech therapy. Brain MRI with left  temporal restricted diffusion/ left temporal infarct. Neurology was consulted, recommendations to continue anticoagulation and81 mgaspirin x 2 weeks, then stop ASA.  07/29/19:Seen and examined at her bedside with her son and daughter present. Daughter and son concerned about her mentation and stated that she looks like she typically does when she has an urinary tract infection. To note patient was treated for E. coli UTI with Rocephin x3 days. Repeatedher urine culture, pending. Patient's son requested left knee being imaged, ordered left knee x-ray which resulted negative for any acute findings. PT has assessed and recommended SNF. Awaiting speech assessment. Too drowsy to complete study.Appreciate TOC assistance with SNF placement.  07/30/19: Drowsy, ABGshowsno evidence of hypoxia or CO2 retention on RA. CXR possible left lower atelectasis vs small effusion, infiltratescouldnot be excluded. Will obtain procalcitonin level and cbc with differntials.We will also obtain CT head without contrast and ammonia level.   6/21 /21: Pt more alert but with nonsensical speech.  She is unable to voice her needs or concerns.  Speech therapy eval validates language dysfunction and continued dysphagia and expressed concerns that pt will not be able to sustain herself with adequate po intake and is high risk for dehydration.  Family continues to have unrealistic expectations.  Advanced to dys 1 with thin liquids per speech  6/21 /21: pt is pleasantly confused, but family feels she is improving from cognitive/mental standpoint.  Her po intake is fair, but she is eating only 25% of meals.  Palliative following pt and pt remains full code for now.  CT chest suggests aspiration pneumonitits--start amox/clav x 5 days.   Discharge Diagnoses:  Acute left temporalCVA (cerebral vascular accident) ([place]), complicated with metabolic encephalopathy, likely has underlying dementia with sundowning -Patient  presented with confusion,  agitation, very poor sleep, had received Haldol as needed inpatient. -Started on Zyprexa night of 07/27/19, doses were reduced. -Daughter and son concerned about her mentation and stated that she looks like she typically does when she has an urinary tract infection. Urine culture repeated on 07/29/2019, pending. Previously treated for E. coli UTI with 3 days of Rocephin. -Patient has been seen by neurology, 2D echo showed EF of 60 to 65%, noWMA, noPFO or thrombus.  -CT angio head and neck showed no large vessel occlusion, negative perfusion study, no carotid or vertebralstenosis. -LDL 45-at goal -Hemoglobin A1c 7.0-goal less than 7.0 Continue aspirin and statin -Per neurology: Continue aspirin 81 mg daily for [redacted] weeks along with anticoagulation/Eliquis then stop ASA 81 mg daily -30-day cardiac event monitor upon discharge. -PT recommended SNF, TOC assisting with placement. -Aspiration and fall precautions  Acute metabolic encephalopathy, likely multifactorial Recent acute left temporal CVA Repeat CT head without contrast Obtain ammonia level--24 -check B12--1918 -check folate--16.9 -TSH--3.214 -CT chest--small amount of fluid in LLL bronchi with material in patulous esophagus concerning for aspiration -PCT <0.10 -continue IVF -overall slowly improving -family ok with transition to snf -neurology recommended 30-day event monitor after d/c--please place on patient once it is shipped to SNF  Aspiration pneumonitits -amox/clav x 5 days after d/c  TreatedUTI, E. Coli, POA -Patient has a history of recurrent UTIs, urine culture showed 10,000 CFU of E. Coli -Patient has completed 3 days of IV Rocephin -Blood cultures negative to date, -Urine culture was repeated on 07/29/2019 due to family concern as stated above, pending. Continue to follow cultures  Baseline gait impairment post fall -Likely multifactorial, per neurology, mild parkinsonian, recommend  avoid neuroleptics -Bilateral knee x-rays, no acute findings -PT has recommended SNF -CSW assisting with SNF placement  Diabetes mellitus type 2, NIDDM, with neurological complications -Hemoglobin A1c 7.0, goal less than 7.0 -Continue sliding scale insulin while inpatient -Continue to holdofforal hypoglycemics, Metformin  HTN (hypertension) -BP is at goal -Continue to monitor vital signs  Recurrent pulmonary embolism (HCC) -Continue Eliquis -O2 saturation 95% on room air  GERD Continue PPI  Troponin elevation -Likely due to acute CVA, no chest pain, no significant EKG changes, 2D echo with EF of 60 to 65%, no wall motion abnormality. -Cardiology was consulted, seen on 6/16, recommended no further inpatient cardiac work-up. -Consider outpatient follow-up, outpatient Lexiscan Myoview to further risk stratify, will need 30-day monitor at discharge. -Social worker consulted to assist with cardiology referral  Obesity Estimated body mass index is 31.73 kg/m as calculated from the following: Height as of this encounter: 5\' 2"  (1.575 m). Weight as of this encounter: 78.7 kg.  Goals of care Palliative care team has been consulted to assist with establishing goals of care    Discharge Instructions   Allergies as of 08/01/2019      Reactions   Ativan [lorazepam] Other (See Comments)   Hallucinations    Baclofen    Confusion, altered mental state    Hydrocodone-acetaminophen Diarrhea   Other Nausea And Vomiting   general anesthesia   Percocet [oxycodone-acetaminophen] Nausea And Vomiting      Medication List    STOP taking these medications   imipramine 25 MG tablet Commonly known as: TOFRANIL   loperamide 2 MG tablet Commonly known as: IMODIUM A-D   losartan 25 MG tablet Commonly known as: COZAAR   traMADol 50 MG tablet Commonly known as: ULTRAM     TAKE these medications   acetaminophen 650 MG CR tablet Commonly  known as: TYLENOL Take 650  mg by mouth in the morning and at bedtime.   amoxicillin-clavulanate 400-57 MG/5ML suspension Commonly known as: AUGMENTIN Take 10 mLs (800 mg total) by mouth 2 (two) times daily. X 5 days   CALCIUM + D + K PO Take 1 tablet by mouth in the morning and at bedtime.   CORICIDIN COUGH/COLD 4-30 MG Tabs Generic drug: Chlorpheniramine-DM Take 1 tablet by mouth daily as needed (cough).   Delsym 30 MG/5ML liquid Generic drug: dextromethorphan Take 15 mg by mouth as needed for cough.   Dialyvite Vitamin D 5000 125 MCG (5000 UT) capsule Generic drug: Cholecalciferol Take 5,000 Units by mouth in the morning.   Eliquis 5 MG Tabs tablet Generic drug: apixaban Take 5 mg by mouth 2 (two) times daily.   ferrous sulfate 325 (65 FE) MG tablet Take 325 mg by mouth at bedtime.   guaiFENesin 600 MG 12 hr tablet Commonly known as: MUCINEX Take 600 mg by mouth 2 (two) times daily as needed for cough or to loosen phlegm.   hydrOXYzine 25 MG tablet Commonly known as: ATARAX/VISTARIL Take 1 tablet (25 mg total) by mouth 3 (three) times daily as needed for anxiety. What changed:   how much to take  when to take this   [place] 1 application topically daily as needed (back pain).   metFORMIN 1000 MG tablet Commonly known as: GLUCOPHAGE Take 1,000 mg by mouth daily with breakfast.   metoprolol succinate 25 MG 24 hr tablet Commonly known as: Toprol XL Take 1 tablet (25 mg total) by mouth daily.   omeprazole 40 MG capsule Commonly known as: PRILOSEC Take 1 capsule (40 mg total) by mouth daily. What changed: when to take this   Refresh Plus 0.5 % Soln Generic drug: Carboxymethylcellulose Sod PF Place 1 drop into the left eye 3 (three) times daily as needed (dry eye).   rosuvastatin 10 MG tablet Commonly known as: CRESTOR Take 5 mg by mouth in the morning.   sertraline 25 MG tablet Commonly known as: ZOLOFT Take 25 mg by mouth in the morning.   vitamin B-12 1000 MCG  tablet Commonly known as: CYANOCOBALAMIN Take 1,000 mcg by mouth in the morning.       Contact information for after-discharge care    Destination    [facility] .   Service: Skilled Nursing Contact information: [address] [place] [phone]                 Allergies  Allergen Reactions  . Ativan [Lorazepam] Other (See Comments)    Hallucinations   . Baclofen     Confusion, altered mental state   . Hydrocodone-Acetaminophen Diarrhea  . Other Nausea And Vomiting    general anesthesia  . Percocet [Oxycodone-Acetaminophen] Nausea And Vomiting    Consultations:  Palliative medicine  neurology   Procedures/Studies: CT ANGIO HEAD W OR WO CONTRAST  Result Date: 07/25/2019 CLINICAL DATA:  Dysarthria and confusion. EXAM: CT ANGIOGRAPHY HEAD AND NECK CT PERFUSION BRAIN TECHNIQUE: Multidetector CT imaging of the head and neck was performed using the standard protocol during bolus administration of intravenous contrast. Multiplanar CT image reconstructions and MIPs were obtained to evaluate the vascular anatomy. Carotid stenosis measurements (when applicable) are obtained utilizing NASCET criteria, using the distal internal carotid diameter as the denominator. Multiphase CT imaging of the brain was performed following IV bolus contrast injection. Subsequent parametric perfusion  maps were calculated using RAPID software. CONTRAST:  19mL OMNIPAQUE IOHEXOL 350 MG/ML SOLN COMPARISON:  Head CT earlier same day. FINDINGS: CTA NECK FINDINGS Aortic arch: Mild aortic atherosclerosis. Right carotid system: Normal.  Tortuous vessels but no stenosis. Left carotid system: Normal tortuous vessels but no stenosis. Vertebral arteries: Both vertebral artery origins are widely patent. Both vertebral arteries are widely patent through the cervical region to the foramen magnum. Skeleton: Negative Other neck: No mass or  adenopathy. Upper chest: Negative Review of the MIP images confirms the above findings CTA HEAD FINDINGS Anterior circulation: Both internal carotid arteries are widely patent through the skull base and siphon regions. The anterior and middle cerebral vessels are patent. No large or medium vessel occlusion. No proximal stenosis. Posterior circulation: Both vertebral arteries widely patent to the basilar. No basilar stenosis. Posterior circulation branch vessels are normal. Fetal origin right PCA. Venous sinuses: Normal Anatomic variants: None Review of the MIP images confirms the above findings CT Brain Perfusion Findings: ASPECTS: 10 CBF (<30%) Volume: 69mL Perfusion (Tmax>6.0s) volume: 10mL Mismatch Volume: 41mL Infarction Location:None IMPRESSION: No large vessel occlusion.  Negative perfusion study. No carotid or vertebral stenosis. Tortuous vessels suggesting hypertension. These results were called by telephone at the time of interpretation on 07/25/2019 at 5:52 pm to provider [facility] ZAMMIT , who verbally acknowledged these results. Electronically Signed   By: Nelson Chimes M.D.   On: 07/25/2019 17:52   CT ANGIO NECK W OR WO CONTRAST  Result Date: 07/25/2019 CLINICAL DATA:  Dysarthria and confusion. EXAM: CT ANGIOGRAPHY HEAD AND NECK CT PERFUSION BRAIN TECHNIQUE: Multidetector CT imaging of the head and neck was performed using the standard protocol during bolus administration of intravenous contrast. Multiplanar CT image reconstructions and MIPs were obtained to evaluate the vascular anatomy. Carotid stenosis measurements (when applicable) are obtained utilizing NASCET criteria, using the distal internal carotid diameter as the denominator. Multiphase CT imaging of the brain was performed following IV bolus contrast injection. Subsequent parametric perfusion maps were calculated using RAPID software. CONTRAST:  158mL OMNIPAQUE IOHEXOL 350 MG/ML SOLN COMPARISON:  Head CT earlier same day. FINDINGS: CTA NECK  FINDINGS Aortic arch: Mild aortic atherosclerosis. Right carotid system: Normal.  Tortuous vessels but no stenosis. Left carotid system: Normal tortuous vessels but no stenosis. Vertebral arteries: Both vertebral artery origins are widely patent. Both vertebral arteries are widely patent through the cervical region to the foramen magnum. Skeleton: Negative Other neck: No mass or adenopathy. Upper chest: Negative Review of the MIP images confirms the above findings CTA HEAD FINDINGS Anterior circulation: Both internal carotid arteries are widely patent through the skull base and siphon regions. The anterior and middle cerebral vessels are patent. No large or medium vessel occlusion. No proximal stenosis. Posterior circulation: Both vertebral arteries widely patent to the basilar. No basilar stenosis. Posterior circulation branch vessels are normal. Fetal origin right PCA. Venous sinuses: Normal Anatomic variants: None Review of the MIP images confirms the above findings CT Brain Perfusion Findings: ASPECTS: 10 CBF (<30%) Volume: 70mL Perfusion (Tmax>6.0s) volume: 54mL Mismatch Volume: 36mL Infarction Location:None IMPRESSION: No large vessel occlusion.  Negative perfusion study. No carotid or vertebral stenosis. Tortuous vessels suggesting hypertension. These results were called by telephone at the time of interpretation on 07/25/2019 at 5:52 pm to provider [facility] ZAMMIT , who verbally acknowledged these results. Electronically Signed   By: Nelson Chimes M.D.   On: 07/25/2019 17:52   CT CHEST WO CONTRAST  Result Date: 07/31/2019 CLINICAL DATA:  Cough, persistent  pneumonia with effusion EXAM: CT CHEST WITHOUT CONTRAST TECHNIQUE: Multidetector CT imaging of the chest was performed following the standard protocol without IV contrast. COMPARISON:  Prior chest CT from 2008 and recent chest x-rays from June of 2021 FINDINGS: Cardiovascular: Heart size mildly enlarged without pericardial effusion. Aorta with scattered  atherosclerotic calcifications without aneurysmal dilation. Central pulmonary vasculature is of normal caliber. Vessels not well assessed given the lack of intravenous contrast. Mediastinum/Nodes: Large hiatal hernia extending into the chest, approximately 50% of the stomach herniated into the chest. No thoracic inlet adenopathy. No axillary lymphadenopathy. Lungs/Pleura: Anterior RIGHT upper lobe pulmonary nodule (image 68, series 4) 10 x 10 mm unchanged since 2008. Parenchymal assessment is limited by respiratory motion. Airways, large airways are patent. Smaller airways in the LEFT lower lobe may have some material within the lumen. Small LEFT-sided pleural effusion and basilar volume loss overlying the hiatal hernia. Upper Abdomen: Post cholecystectomy. Limited assessment of upper abdominal contents shows renal parenchymal scarring and mild perinephric stranding along with hiatal hernia mentioned above. Musculoskeletal: No chest wall mass. No acute bone process. Signs of prior trauma to RIGHT-sided ribs with some callus formation about posterolateral seventh and eighth ribs. Signs of cement augmentation of the T12 vertebral level. IMPRESSION: 1. Small LEFT-sided pleural effusion and basilar volume loss adjacent to hiatal hernia. Correlate with any risk factors for aspiration given the potential for some fluid in LEFT lower lobe bronchi and material in the mildly patulous esophagus in the setting of large hiatal hernia. Esophagus. 2. Stable RIGHT upper lobe pulmonary nodule since 2008. Compatible with benign finding. 3. Signs of prior trauma to RIGHT-sided ribs with some callus formation about posterolateral seventh and eighth ribs. 4. Aortic atherosclerosis. Aortic Atherosclerosis (ICD10-I70.0). Electronically Signed   By: Zetta Bills M.D.   On: 07/31/2019 16:15   MR BRAIN WO CONTRAST  Result Date: 07/25/2019 CLINICAL DATA:  Altered mental status. Slurred speech and confusion. EXAM: MRI HEAD WITHOUT  CONTRAST TECHNIQUE: Multiplanar, multiecho pulse sequences of the brain and surrounding structures were obtained without intravenous contrast. COMPARISON:  CT studies same day FINDINGS: Brain: Severely motion degraded exam. Diffusion imaging only was obtained, and this is markedly suboptimal. One could question some restricted diffusion in the left temporal lobe. This is not a reliable finding. Vascular: No data Skull and upper cervical spine: No data Sinuses/Orbits: No data Other: No data IMPRESSION: Severely motion degraded exam. Only diffusion imaging was achieved, and this is of very limited utility. One could question some restricted diffusion in the left temporal lobe, but this is not a reliable finding at this quality. Electronically Signed   By: Nelson Chimes M.D.   On: 07/25/2019 20:44   DG Pelvis Portable  Result Date: 07/25/2019 CLINICAL DATA:  Fall, found at bottom of steps EXAM: PORTABLE PELVIS 1-2 VIEWS COMPARISON:  None. FINDINGS: Contrast material within the urinary bladder obscures portions of the pelvic bones. The SI joints are non widened. The pubic symphysis is non widened. Inferior pubic rami appear intact. The femoral heads project in joint. IMPRESSION: No acute osseous abnormality Electronically Signed   By: Donavan Foil M.D.   On: 07/25/2019 18:53   CT CEREBRAL PERFUSION W CONTRAST  Result Date: 07/25/2019 CLINICAL DATA:  Dysarthria and confusion. EXAM: CT ANGIOGRAPHY HEAD AND NECK CT PERFUSION BRAIN TECHNIQUE: Multidetector CT imaging of the head and neck was performed using the standard protocol during bolus administration of intravenous contrast. Multiplanar CT image reconstructions and MIPs were obtained to evaluate the vascular  anatomy. Carotid stenosis measurements (when applicable) are obtained utilizing NASCET criteria, using the distal internal carotid diameter as the denominator. Multiphase CT imaging of the brain was performed following IV bolus contrast injection.  Subsequent parametric perfusion maps were calculated using RAPID software. CONTRAST:  162mL OMNIPAQUE IOHEXOL 350 MG/ML SOLN COMPARISON:  Head CT earlier same day. FINDINGS: CTA NECK FINDINGS Aortic arch: Mild aortic atherosclerosis. Right carotid system: Normal.  Tortuous vessels but no stenosis. Left carotid system: Normal tortuous vessels but no stenosis. Vertebral arteries: Both vertebral artery origins are widely patent. Both vertebral arteries are widely patent through the cervical region to the foramen magnum. Skeleton: Negative Other neck: No mass or adenopathy. Upper chest: Negative Review of the MIP images confirms the above findings CTA HEAD FINDINGS Anterior circulation: Both internal carotid arteries are widely patent through the skull base and siphon regions. The anterior and middle cerebral vessels are patent. No large or medium vessel occlusion. No proximal stenosis. Posterior circulation: Both vertebral arteries widely patent to the basilar. No basilar stenosis. Posterior circulation branch vessels are normal. Fetal origin right PCA. Venous sinuses: Normal Anatomic variants: None Review of the MIP images confirms the above findings CT Brain Perfusion Findings: ASPECTS: 10 CBF (<30%) Volume: 52mL Perfusion (Tmax>6.0s) volume: 43mL Mismatch Volume: 34mL Infarction Location:None IMPRESSION: No large vessel occlusion.  Negative perfusion study. No carotid or vertebral stenosis. Tortuous vessels suggesting hypertension. These results were called by telephone at the time of interpretation on 07/25/2019 at 5:52 pm to provider [facility] ZAMMIT , who verbally acknowledged these results. Electronically Signed   By: Nelson Chimes M.D.   On: 07/25/2019 17:52   DG CHEST PORT 1 VIEW  Result Date: 07/30/2019 CLINICAL DATA:  Altered mental status. EXAM: PORTABLE CHEST 1 VIEW COMPARISON:  July 25, 2019 FINDINGS: Rounded opacity projected near the left first costochondral junction was not present on the March 05, 2017  study. There is a small left effusion with underlying opacity, new since July 25, 2019. Healed right-sided rib fractures are again noted. No pneumothorax. The right lung is clear. The cardiomediastinal silhouette is stable. IMPRESSION: 1. The rounded opacity in the region of the left first costochondral junction may represent degenerative change in this region. An infiltrate or developing nodule are considered less likely. Recommend a PA and lateral chest x-ray before discharge to ensure resolution of this finding. 2. Small left effusion with underlying atelectasis, new since July 25, 2019. 3. No other acute abnormalities. Electronically Signed   By: Dorise Bullion III M.D   On: 07/30/2019 13:28   DG Chest Port 1 View  Result Date: 07/25/2019 CLINICAL DATA:  Fall EXAM: PORTABLE CHEST 1 VIEW COMPARISON:  Radiograph 03/05/2017, 11/25/2017 FINDINGS: Low lung volumes with streaky areas of atelectatic change in central vascular crowding. More bandlike opacity present in the left lung base likely reflect further atelectasis or scarring. No focal consolidation or convincing features of edema. No pneumothorax or visible effusion. Cardiomediastinal contours are similar to prior accounting for differences in technique. Remote lower thoracic compression deformity and vertebroplasty changes as well as several healed right posterolateral rib deformities. Multilevel degenerative changes are present in the imaged portions of the spine. No acute osseous or soft tissue abnormality. Telemetry leads overlie the chest. IMPRESSION: Low lung volumes with streaky areas of atelectatic change and central vascular crowding. More bandlike opacity present in the left lung base likely reflect further atelectasis or scarring. Remote rib fractures and a prior lower thoracic vertebroplasty. Similar to prior. Electronically Signed  By: Lovena Le M.D.   On: 07/25/2019 18:54   DG Knee Complete 4 Views Left  Result Date:  07/29/2019 CLINICAL DATA:  Arthritis, LEFT knee pain.  Fall. EXAM: LEFT KNEE - COMPLETE 4+ VIEW COMPARISON:  None. FINDINGS: There is narrowing of the medial and lateral compartment. Mild osteophytosis. Patellofemoral spurring additionally. No joint effusion. No acute fracture dislocation. IMPRESSION: Tricompartmental osteoarthritis of the knee.  No acute findings. Electronically Signed   By: Suzy Bouchard M.D.   On: 07/29/2019 10:21   DG Knee Complete 4 Views Right  Result Date: 07/26/2019 CLINICAL DATA:  [place], found down EXAM: RIGHT KNEE - COMPLETE 4+ VIEW COMPARISON:  12/21/2018 FINDINGS: Frontal, bilateral oblique, lateral views of the right knee are obtained. Two cannulated screws traverse the patella consistent with previous fracture repair. No acute fracture, subluxation, or dislocation. Three compartmental osteoarthritis, with chondrocalcinosis in the medial and lateral compartments. No joint effusion. IMPRESSION: 1. No acute bony abnormality. 2. ORIF previous patellar fracture. 3. Three compartmental osteoarthritis. Electronically Signed   By: Randa Ngo M.D.   On: 07/26/2019 20:44   ECHOCARDIOGRAM COMPLETE  Result Date: 07/26/2019    ECHOCARDIOGRAM REPORT   Patient Name:   SHELLI PORTILLA Date of Exam: 07/26/2019 Medical Rec #:  [id number]       Height:       62.0 in Accession #:    [id number]      Weight:       173.5 lb Date of Birth:  May 04, 1934        BSA:          1.800 m Patient Age:    85 years        BP:           125/52 mmHg Patient Gender: F               HR:           103 bpm. Exam Location:  [facility] Procedure: 2D Echo Indications:    Elevated Troponin  History:        Patient has prior history of Echocardiogram examinations, most                 recent 12/25/2018. Risk Factors:Hypertension, Diabetes and                 Non-Smoker. GERD, Recurrent pulmonary embolism , UTI.  Sonographer:    Leavy Cella RDCS (AE) Referring Phys: [place]  1. Left  ventricular ejection fraction, by estimation, is 60 to 65%. The left ventricle has normal function. The left ventricle has no regional wall motion abnormalities. Left ventricular diastolic parameters are consistent with age-related delayed relaxation (normal).  2. Right ventricular systolic function is normal. The right ventricular size is normal. There is mildly elevated pulmonary artery systolic pressure.  3. The mitral valve is normal in structure. Trivial mitral valve regurgitation. No evidence of mitral stenosis.  4. The aortic valve is tricuspid. Aortic valve regurgitation is not visualized. Mild to moderate aortic valve sclerosis/calcification is present, without any evidence of aortic stenosis.  5. The inferior vena cava is normal in size with greater than 50% respiratory variability, suggesting right atrial pressure of 3 mmHg. FINDINGS  Left Ventricle: Left ventricular ejection fraction, by estimation, is 60 to 65%. The left ventricle has normal function. The left ventricle has no regional wall motion abnormalities. The left ventricular internal cavity size was normal in size. There is  no left  ventricular hypertrophy. Left ventricular diastolic parameters are consistent with age-related delayed relaxation (normal). Right Ventricle: The right ventricular size is normal. No increase in right ventricular wall thickness. Right ventricular systolic function is normal. There is mildly elevated pulmonary artery systolic pressure. The tricuspid regurgitant velocity is 2.86  m/s, and with an assumed right atrial pressure of 10 mmHg, the estimated right ventricular systolic pressure is 30.0 mmHg. Left Atrium: Left atrial size was normal in size. Right Atrium: Right atrial size was normal in size. Pericardium: There is no evidence of pericardial effusion. Mitral Valve: The mitral valve is normal in structure. Normal mobility of the mitral valve leaflets. Trivial mitral valve regurgitation. No evidence of mitral valve  stenosis. Tricuspid Valve: The tricuspid valve is normal in structure. Tricuspid valve regurgitation is mild . No evidence of tricuspid stenosis. Aortic Valve: The aortic valve is tricuspid. Aortic valve regurgitation is not visualized. Mild to moderate aortic valve sclerosis/calcification is present, without any evidence of aortic stenosis. Pulmonic Valve: The pulmonic valve was normal in structure. Pulmonic valve regurgitation is not visualized. No evidence of pulmonic stenosis. Aorta: The aortic root is normal in size and structure. Venous: The inferior vena cava is normal in size with greater than 50% respiratory variability, suggesting right atrial pressure of 3 mmHg. IAS/Shunts: No atrial level shunt detected by color flow Doppler.  LEFT VENTRICLE PLAX 2D LVIDd:         4.21 cm  Diastology LVIDs:         2.71 cm  LV e' lateral:   10.70 cm/s LV PW:         1.05 cm  LV E/e' lateral: 8.0 LV IVS:        0.87 cm  LV e' medial:    6.74 cm/s LVOT diam:     1.80 cm  LV E/e' medial:  12.7 LVOT Area:     2.54 cm  RIGHT VENTRICLE RV S prime:     15.40 cm/s TAPSE (M-mode): 2.8 cm LEFT ATRIUM             Index       RIGHT ATRIUM          Index LA diam:        2.80 cm 1.56 cm/m  RA Area:     9.11 cm LA Vol (A2C):   63.9 ml 35.51 ml/m RA Volume:   16.40 ml 9.11 ml/m LA Vol (A4C):   42.6 ml 23.67 ml/m LA Biplane Vol: 54.6 ml 30.34 ml/m   AORTA Ao Root diam: 2.90 cm MITRAL VALVE               TRICUSPID VALVE MV Area (PHT): 3.53 cm    TR Peak grad:   32.7 mmHg MV Decel Time: 215 msec    TR Vmax:        286.00 cm/s MV E velocity: 85.90 cm/s MV A velocity: 59.20 cm/s  SHUNTS MV E/A ratio:  1.45        Systemic Diam: 1.80 cm Jenkins Rouge MD Electronically signed by Jenkins Rouge MD Signature Date/Time: 07/26/2019/12:09:47 PM    Final    CT HEAD CODE STROKE WO CONTRAST  Result Date: 07/25/2019 CLINICAL DATA:  Code stroke.  Slurred speech. EXAM: CT HEAD WITHOUT CONTRAST TECHNIQUE: Contiguous axial images were obtained from  the base of the skull through the vertex without intravenous contrast. COMPARISON:  12/24/2018 FINDINGS: Brain: Age related atrophy. Chronic small-vessel ischemic changes of the cerebral hemispheric white matter. No  sign of acute infarction, mass lesion, hemorrhage, hydrocephalus or extra-axial collection. Vascular: There is atherosclerotic calcification of the major vessels at the base of the brain. Skull: Negative Sinuses/Orbits: Clear sinuses.  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 Other: None ASPECTS ([place] Stroke Program Early CT Score) - Ganglionic level infarction (caudate, lentiform nuclei, internal capsule, insula, M1-M3 cortex): 7 - Supraganglionic infarction (M4-M6 cortex): 3 Total score (0-10 with 10 being normal): 10 IMPRESSION: 1. No acute finding by CT. Atrophy and chronic small-vessel ischemic changes of the white matter. 2. ASPECTS is 10 3. These results were called by telephone at the time of interpretation on 07/25/2019 at 5:12 pm to provider [facility] ZAMMIT , who verbally acknowledged these results. Electronically Signed   By: Nelson Chimes M.D.   On: 07/25/2019 17:12        Discharge Exam: Vitals:   08/01/19 0433 08/01/19 1321  BP: (!) 105/52 (!) 113/48  Pulse: 79 71  Resp: 16 20  Temp: 98 F (36.7 C) 97.6 F (36.4 C)  SpO2: 95% 96%   Vitals:   07/31/19 2117 08/01/19 0013 08/01/19 0433 08/01/19 1321  BP: (!) 149/60 (!) 143/73 (!) 105/52 (!) 113/48  Pulse: (!) 108 (!) 109 79 71  Resp: 17 20 16 20   Temp: (!) 97.5 F (36.4 C) 98.8 F (37.1 C) 98 F (36.7 C) 97.6 F (36.4 C)  TempSrc: Oral Oral  Oral  SpO2: 96% 93% 95% 96%  Weight:      Height:        General: Pt is alert, awake, not in acute distress Cardiovascular: RRR, S1/S2 +, no rubs, no gallops Respiratory: bibasilar rales. No wheeze Abdominal: Soft, NT, ND, bowel sounds + Extremities: no edema, no cyanosis   The results of significant diagnostics from this hospitalization (including imaging, microbiology,  ancillary and laboratory) are listed below for reference.    Significant Diagnostic Studies: CT ANGIO HEAD W OR WO CONTRAST  Result Date: 07/25/2019 CLINICAL DATA:  Dysarthria and confusion. EXAM: CT ANGIOGRAPHY HEAD AND NECK CT PERFUSION BRAIN TECHNIQUE: Multidetector CT imaging of the head and neck was performed using the standard protocol during bolus administration of intravenous contrast. Multiplanar CT image reconstructions and MIPs were obtained to evaluate the vascular anatomy. Carotid stenosis measurements (when applicable) are obtained utilizing NASCET criteria, using the distal internal carotid diameter as the denominator. Multiphase CT imaging of the brain was performed following IV bolus contrast injection. Subsequent parametric perfusion maps were calculated using RAPID software. CONTRAST:  172mL OMNIPAQUE IOHEXOL 350 MG/ML SOLN COMPARISON:  Head CT earlier same day. FINDINGS: CTA NECK FINDINGS Aortic arch: Mild aortic atherosclerosis. Right carotid system: Normal.  Tortuous vessels but no stenosis. Left carotid system: Normal tortuous vessels but no stenosis. Vertebral arteries: Both vertebral artery origins are widely patent. Both vertebral arteries are widely patent through the cervical region to the foramen magnum. Skeleton: Negative Other neck: No mass or adenopathy. Upper chest: Negative Review of the MIP images confirms the above findings CTA HEAD FINDINGS Anterior circulation: Both internal carotid arteries are widely patent through the skull base and siphon regions. The anterior and middle cerebral vessels are patent. No large or medium vessel occlusion. No proximal stenosis. Posterior circulation: Both vertebral arteries widely patent to the basilar. No basilar stenosis. Posterior circulation branch vessels are normal. Fetal origin right PCA. Venous sinuses: Normal Anatomic variants: None Review of the MIP images confirms the above findings CT Brain Perfusion Findings: ASPECTS: 10 CBF  (<30%) Volume: 63mL Perfusion (Tmax>6.0s) volume: 38mL  Mismatch Volume: 59mL Infarction Location:None IMPRESSION: No large vessel occlusion.  Negative perfusion study. No carotid or vertebral stenosis. Tortuous vessels suggesting hypertension. These results were called by telephone at the time of interpretation on 07/25/2019 at 5:52 pm to provider [facility] ZAMMIT , who verbally acknowledged these results. Electronically Signed   By: Nelson Chimes M.D.   On: 07/25/2019 17:52   CT ANGIO NECK W OR WO CONTRAST  Result Date: 07/25/2019 CLINICAL DATA:  Dysarthria and confusion. EXAM: CT ANGIOGRAPHY HEAD AND NECK CT PERFUSION BRAIN TECHNIQUE: Multidetector CT imaging of the head and neck was performed using the standard protocol during bolus administration of intravenous contrast. Multiplanar CT image reconstructions and MIPs were obtained to evaluate the vascular anatomy. Carotid stenosis measurements (when applicable) are obtained utilizing NASCET criteria, using the distal internal carotid diameter as the denominator. Multiphase CT imaging of the brain was performed following IV bolus contrast injection. Subsequent parametric perfusion maps were calculated using RAPID software. CONTRAST:  111mL OMNIPAQUE IOHEXOL 350 MG/ML SOLN COMPARISON:  Head CT earlier same day. FINDINGS: CTA NECK FINDINGS Aortic arch: Mild aortic atherosclerosis. Right carotid system: Normal.  Tortuous vessels but no stenosis. Left carotid system: Normal tortuous vessels but no stenosis. Vertebral arteries: Both vertebral artery origins are widely patent. Both vertebral arteries are widely patent through the cervical region to the foramen magnum. Skeleton: Negative Other neck: No mass or adenopathy. Upper chest: Negative Review of the MIP images confirms the above findings CTA HEAD FINDINGS Anterior circulation: Both internal carotid arteries are widely patent through the skull base and siphon regions. The anterior and middle cerebral vessels are  patent. No large or medium vessel occlusion. No proximal stenosis. Posterior circulation: Both vertebral arteries widely patent to the basilar. No basilar stenosis. Posterior circulation branch vessels are normal. Fetal origin right PCA. Venous sinuses: Normal Anatomic variants: None Review of the MIP images confirms the above findings CT Brain Perfusion Findings: ASPECTS: 10 CBF (<30%) Volume: 33mL Perfusion (Tmax>6.0s) volume: 63mL Mismatch Volume: 57mL Infarction Location:None IMPRESSION: No large vessel occlusion.  Negative perfusion study. No carotid or vertebral stenosis. Tortuous vessels suggesting hypertension. These results were called by telephone at the time of interpretation on 07/25/2019 at 5:52 pm to provider [facility] ZAMMIT , who verbally acknowledged these results. Electronically Signed   By: Nelson Chimes M.D.   On: 07/25/2019 17:52   CT CHEST WO CONTRAST  Result Date: 07/31/2019 CLINICAL DATA:  Cough, persistent pneumonia with effusion EXAM: CT CHEST WITHOUT CONTRAST TECHNIQUE: Multidetector CT imaging of the chest was performed following the standard protocol without IV contrast. COMPARISON:  Prior chest CT from 2008 and recent chest x-rays from June of 2021 FINDINGS: Cardiovascular: Heart size mildly enlarged without pericardial effusion. Aorta with scattered atherosclerotic calcifications without aneurysmal dilation. Central pulmonary vasculature is of normal caliber. Vessels not well assessed given the lack of intravenous contrast. Mediastinum/Nodes: Large hiatal hernia extending into the chest, approximately 50% of the stomach herniated into the chest. No thoracic inlet adenopathy. No axillary lymphadenopathy. Lungs/Pleura: Anterior RIGHT upper lobe pulmonary nodule (image 68, series 4) 10 x 10 mm unchanged since 2008. Parenchymal assessment is limited by respiratory motion. Airways, large airways are patent. Smaller airways in the LEFT lower lobe may have some material within the lumen. Small  LEFT-sided pleural effusion and basilar volume loss overlying the hiatal hernia. Upper Abdomen: Post cholecystectomy. Limited assessment of upper abdominal contents shows renal parenchymal scarring and mild perinephric stranding along with hiatal hernia mentioned above. Musculoskeletal: No chest  wall mass. No acute bone process. Signs of prior trauma to RIGHT-sided ribs with some callus formation about posterolateral seventh and eighth ribs. Signs of cement augmentation of the T12 vertebral level. IMPRESSION: 1. Small LEFT-sided pleural effusion and basilar volume loss adjacent to hiatal hernia. Correlate with any risk factors for aspiration given the potential for some fluid in LEFT lower lobe bronchi and material in the mildly patulous esophagus in the setting of large hiatal hernia. Esophagus. 2. Stable RIGHT upper lobe pulmonary nodule since 2008. Compatible with benign finding. 3. Signs of prior trauma to RIGHT-sided ribs with some callus formation about posterolateral seventh and eighth ribs. 4. Aortic atherosclerosis. Aortic Atherosclerosis (ICD10-I70.0). Electronically Signed   By: Zetta Bills M.D.   On: 07/31/2019 16:15   MR BRAIN WO CONTRAST  Result Date: 07/25/2019 CLINICAL DATA:  Altered mental status. Slurred speech and confusion. EXAM: MRI HEAD WITHOUT CONTRAST TECHNIQUE: Multiplanar, multiecho pulse sequences of the brain and surrounding structures were obtained without intravenous contrast. COMPARISON:  CT studies same day FINDINGS: Brain: Severely motion degraded exam. Diffusion imaging only was obtained, and this is markedly suboptimal. One could question some restricted diffusion in the left temporal lobe. This is not a reliable finding. Vascular: No data Skull and upper cervical spine: No data Sinuses/Orbits: No data Other: No data IMPRESSION: Severely motion degraded exam. Only diffusion imaging was achieved, and this is of very limited utility. One could question some restricted  diffusion in the left temporal lobe, but this is not a reliable finding at this quality. Electronically Signed   By: Nelson Chimes M.D.   On: 07/25/2019 20:44   DG Pelvis Portable  Result Date: 07/25/2019 CLINICAL DATA:  Fall, found at bottom of steps EXAM: PORTABLE PELVIS 1-2 VIEWS COMPARISON:  None. FINDINGS: Contrast material within the urinary bladder obscures portions of the pelvic bones. The SI joints are non widened. The pubic symphysis is non widened. Inferior pubic rami appear intact. The femoral heads project in joint. IMPRESSION: No acute osseous abnormality Electronically Signed   By: Donavan Foil M.D.   On: 07/25/2019 18:53   CT CEREBRAL PERFUSION W CONTRAST  Result Date: 07/25/2019 CLINICAL DATA:  Dysarthria and confusion. EXAM: CT ANGIOGRAPHY HEAD AND NECK CT PERFUSION BRAIN TECHNIQUE: Multidetector CT imaging of the head and neck was performed using the standard protocol during bolus administration of intravenous contrast. Multiplanar CT image reconstructions and MIPs were obtained to evaluate the vascular anatomy. Carotid stenosis measurements (when applicable) are obtained utilizing NASCET criteria, using the distal internal carotid diameter as the denominator. Multiphase CT imaging of the brain was performed following IV bolus contrast injection. Subsequent parametric perfusion maps were calculated using RAPID software. CONTRAST:  143mL OMNIPAQUE IOHEXOL 350 MG/ML SOLN COMPARISON:  Head CT earlier same day. FINDINGS: CTA NECK FINDINGS Aortic arch: Mild aortic atherosclerosis. Right carotid system: Normal.  Tortuous vessels but no stenosis. Left carotid system: Normal tortuous vessels but no stenosis. Vertebral arteries: Both vertebral artery origins are widely patent. Both vertebral arteries are widely patent through the cervical region to the foramen magnum. Skeleton: Negative Other neck: No mass or adenopathy. Upper chest: Negative Review of the MIP images confirms the above findings CTA  HEAD FINDINGS Anterior circulation: Both internal carotid arteries are widely patent through the skull base and siphon regions. The anterior and middle cerebral vessels are patent. No large or medium vessel occlusion. No proximal stenosis. Posterior circulation: Both vertebral arteries widely patent to the basilar. No basilar stenosis. Posterior circulation branch  vessels are normal. Fetal origin right PCA. Venous sinuses: Normal Anatomic variants: None Review of the MIP images confirms the above findings CT Brain Perfusion Findings: ASPECTS: 10 CBF (<30%) Volume: 28mL Perfusion (Tmax>6.0s) volume: 20mL Mismatch Volume: 15mL Infarction Location:None IMPRESSION: No large vessel occlusion.  Negative perfusion study. No carotid or vertebral stenosis. Tortuous vessels suggesting hypertension. These results were called by telephone at the time of interpretation on 07/25/2019 at 5:52 pm to provider [facility] ZAMMIT , who verbally acknowledged these results. Electronically Signed   By: Nelson Chimes M.D.   On: 07/25/2019 17:52   DG CHEST PORT 1 VIEW  Result Date: 07/30/2019 CLINICAL DATA:  Altered mental status. EXAM: PORTABLE CHEST 1 VIEW COMPARISON:  July 25, 2019 FINDINGS: Rounded opacity projected near the left first costochondral junction was not present on the March 05, 2017 study. There is a small left effusion with underlying opacity, new since July 25, 2019. Healed right-sided rib fractures are again noted. No pneumothorax. The right lung is clear. The cardiomediastinal silhouette is stable. IMPRESSION: 1. The rounded opacity in the region of the left first costochondral junction may represent degenerative change in this region. An infiltrate or developing nodule are considered less likely. Recommend a PA and lateral chest x-ray before discharge to ensure resolution of this finding. 2. Small left effusion with underlying atelectasis, new since July 25, 2019. 3. No other acute abnormalities. Electronically Signed    By: Dorise Bullion III M.D   On: 07/30/2019 13:28   DG Chest Port 1 View  Result Date: 07/25/2019 CLINICAL DATA:  Fall EXAM: PORTABLE CHEST 1 VIEW COMPARISON:  Radiograph 03/05/2017, 11/25/2017 FINDINGS: Low lung volumes with streaky areas of atelectatic change in central vascular crowding. More bandlike opacity present in the left lung base likely reflect further atelectasis or scarring. No focal consolidation or convincing features of edema. No pneumothorax or visible effusion. Cardiomediastinal contours are similar to prior accounting for differences in technique. Remote lower thoracic compression deformity and vertebroplasty changes as well as several healed right posterolateral rib deformities. Multilevel degenerative changes are present in the imaged portions of the spine. No acute osseous or soft tissue abnormality. Telemetry leads overlie the chest. IMPRESSION: Low lung volumes with streaky areas of atelectatic change and central vascular crowding. More bandlike opacity present in the left lung base likely reflect further atelectasis or scarring. Remote rib fractures and a prior lower thoracic vertebroplasty. Similar to prior. Electronically Signed   By: Lovena Le M.D.   On: 07/25/2019 18:54   DG Knee Complete 4 Views Left  Result Date: 07/29/2019 CLINICAL DATA:  Arthritis, LEFT knee pain.  Fall. EXAM: LEFT KNEE - COMPLETE 4+ VIEW COMPARISON:  None. FINDINGS: There is narrowing of the medial and lateral compartment. Mild osteophytosis. Patellofemoral spurring additionally. No joint effusion. No acute fracture dislocation. IMPRESSION: Tricompartmental osteoarthritis of the knee.  No acute findings. Electronically Signed   By: Suzy Bouchard M.D.   On: 07/29/2019 10:21   DG Knee Complete 4 Views Right  Result Date: 07/26/2019 CLINICAL DATA:  [place], found down EXAM: RIGHT KNEE - COMPLETE 4+ VIEW COMPARISON:  12/21/2018 FINDINGS: Frontal, bilateral oblique, lateral views of the right knee are  obtained. Two cannulated screws traverse the patella consistent with previous fracture repair. No acute fracture, subluxation, or dislocation. Three compartmental osteoarthritis, with chondrocalcinosis in the medial and lateral compartments. No joint effusion. IMPRESSION: 1. No acute bony abnormality. 2. ORIF previous patellar fracture. 3. Three compartmental osteoarthritis. Electronically Signed   By: Legrand Como  Ow[place] Shark M.D.   On: 07/26/2019 20:44   ECHOCARDIOGRAM COMPLETE  Result Date: 07/26/2019    ECHOCARDIOGRAM REPORT   Patient Name:   SELETHA ZIMMERMANN Date of Exam: 07/26/2019 Medical Rec #:  [id number]       Height:       62.0 in Accession #:    [id number]      Weight:       173.5 lb Date of Birth:  1935-01-17        BSA:          1.800 m Patient Age:    49 years        BP:           125/52 mmHg Patient Gender: F               HR:           103 bpm. Exam Location:  [facility] Procedure: 2D Echo Indications:    Elevated Troponin  History:        Patient has prior history of Echocardiogram examinations, most                 recent 12/25/2018. Risk Factors:Hypertension, Diabetes and                 Non-Smoker. GERD, Recurrent pulmonary embolism , UTI.  Sonographer:    Leavy Cella RDCS (AE) Referring Phys: [place]  1. Left ventricular ejection fraction, by estimation, is 60 to 65%. The left ventricle has normal function. The left ventricle has no regional wall motion abnormalities. Left ventricular diastolic parameters are consistent with age-related delayed relaxation (normal).  2. Right ventricular systolic function is normal. The right ventricular size is normal. There is mildly elevated pulmonary artery systolic pressure.  3. The mitral valve is normal in structure. Trivial mitral valve regurgitation. No evidence of mitral stenosis.  4. The aortic valve is tricuspid. Aortic valve regurgitation is not visualized. Mild to moderate aortic valve sclerosis/calcification is present,  without any evidence of aortic stenosis.  5. The inferior vena cava is normal in size with greater than 50% respiratory variability, suggesting right atrial pressure of 3 mmHg. FINDINGS  Left Ventricle: Left ventricular ejection fraction, by estimation, is 60 to 65%. The left ventricle has normal function. The left ventricle has no regional wall motion abnormalities. The left ventricular internal cavity size was normal in size. There is  no left ventricular hypertrophy. Left ventricular diastolic parameters are consistent with age-related delayed relaxation (normal). Right Ventricle: The right ventricular size is normal. No increase in right ventricular wall thickness. Right ventricular systolic function is normal. There is mildly elevated pulmonary artery systolic pressure. The tricuspid regurgitant velocity is 2.86  m/s, and with an assumed right atrial pressure of 10 mmHg, the estimated right ventricular systolic pressure is 80.9 mmHg. Left Atrium: Left atrial size was normal in size. Right Atrium: Right atrial size was normal in size. Pericardium: There is no evidence of pericardial effusion. Mitral Valve: The mitral valve is normal in structure. Normal mobility of the mitral valve leaflets. Trivial mitral valve regurgitation. No evidence of mitral valve stenosis. Tricuspid Valve: The tricuspid valve is normal in structure. Tricuspid valve regurgitation is mild . No evidence of tricuspid stenosis. Aortic Valve: The aortic valve is tricuspid. Aortic valve regurgitation is not visualized. Mild to moderate aortic valve sclerosis/calcification is present, without any evidence of aortic stenosis. Pulmonic Valve: The pulmonic valve was normal in structure. Pulmonic valve regurgitation is  not visualized. No evidence of pulmonic stenosis. Aorta: The aortic root is normal in size and structure. Venous: The inferior vena cava is normal in size with greater than 50% respiratory variability, suggesting right atrial pressure  of 3 mmHg. IAS/Shunts: No atrial level shunt detected by color flow Doppler.  LEFT VENTRICLE PLAX 2D LVIDd:         4.21 cm  Diastology LVIDs:         2.71 cm  LV e' lateral:   10.70 cm/s LV PW:         1.05 cm  LV E/e' lateral: 8.0 LV IVS:        0.87 cm  LV e' medial:    6.74 cm/s LVOT diam:     1.80 cm  LV E/e' medial:  12.7 LVOT Area:     2.54 cm  RIGHT VENTRICLE RV S prime:     15.40 cm/s TAPSE (M-mode): 2.8 cm LEFT ATRIUM             Index       RIGHT ATRIUM          Index LA diam:        2.80 cm 1.56 cm/m  RA Area:     9.11 cm LA Vol (A2C):   63.9 ml 35.51 ml/m RA Volume:   16.40 ml 9.11 ml/m LA Vol (A4C):   42.6 ml 23.67 ml/m LA Biplane Vol: 54.6 ml 30.34 ml/m   AORTA Ao Root diam: 2.90 cm MITRAL VALVE               TRICUSPID VALVE MV Area (PHT): 3.53 cm    TR Peak grad:   32.7 mmHg MV Decel Time: 215 msec    TR Vmax:        286.00 cm/s MV E velocity: 85.90 cm/s MV A velocity: 59.20 cm/s  SHUNTS MV E/A ratio:  1.45        Systemic Diam: 1.80 cm Jenkins Rouge MD Electronically signed by Jenkins Rouge MD Signature Date/Time: 07/26/2019/12:09:47 PM    Final    CT HEAD CODE STROKE WO CONTRAST  Result Date: 07/25/2019 CLINICAL DATA:  Code stroke.  Slurred speech. EXAM: CT HEAD WITHOUT CONTRAST TECHNIQUE: Contiguous axial images were obtained from the base of the skull through the vertex without intravenous contrast. COMPARISON:  12/24/2018 FINDINGS: Brain: Age related atrophy. Chronic small-vessel ischemic changes of the cerebral hemispheric white matter. No sign of acute infarction, mass lesion, hemorrhage, hydrocephalus or extra-axial collection. Vascular: There is atherosclerotic calcification of the major vessels at the base of the brain. Skull: Negative Sinuses/Orbits: Clear sinuses.  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 Other: None ASPECTS ([place] Stroke Program Early CT Score) - Ganglionic level infarction (caudate, lentiform nuclei, internal capsule, insula, M1-M3 cortex): 7 - Supraganglionic  infarction (M4-M6 cortex): 3 Total score (0-10 with 10 being normal): 10 IMPRESSION: 1. No acute finding by CT. Atrophy and chronic small-vessel ischemic changes of the white matter. 2. ASPECTS is 10 3. These results were called by telephone at the time of interpretation on 07/25/2019 at 5:12 pm to provider [facility] , who verbally acknowledged these results. Electronically Signed   By: Nelson Chimes M.D.   On: 07/25/2019 17:12     Microbiology: Recent Results (from the past 240 hour(s))  Culture, Urine     Status: Abnormal   Collection Time: 07/25/19  4:48 PM   Specimen: Urine, Random  Result Value Ref Range Status   Specimen Description  Final    URINE, RANDOM Performed at [facility], [address] [place], [place] [address]    Special Requests   Final    NONE Performed at [facility], [address] [place], [place] [address]    Culture 10,000 COLONIES/mL ESCHERICHIA COLI (A)  Final   Report Status 07/28/2019 FINAL  Final   Organism ID, Bacteria ESCHERICHIA COLI (A)  Final      Susceptibility   Escherichia coli - MIC*    AMPICILLIN >=32 RESISTANT Resistant     CEFAZOLIN 8 SENSITIVE Sensitive     CEFTRIAXONE <=0.25 SENSITIVE Sensitive     CIPROFLOXACIN <=0.25 SENSITIVE Sensitive     GENTAMICIN <=1 SENSITIVE Sensitive     IMIPENEM <=0.25 SENSITIVE Sensitive     NITROFURANTOIN <=16 SENSITIVE Sensitive     TRIMETH/SULFA <=20 SENSITIVE Sensitive     AMPICILLIN/SULBACTAM >=32 RESISTANT Resistant     PIP/TAZO 8 SENSITIVE Sensitive     * 10,000 COLONIES/mL ESCHERICHIA COLI  SARS Coronavirus 2 by RT PCR (hospital order, performed in [place] hospital lab) Nasopharyngeal Nasopharyngeal Swab     Status: None   Collection Time: 07/25/19 11:18 PM   Specimen: Nasopharyngeal Swab  Result Value Ref Range Status   SARS Coronavirus 2 NEGATIVE NEGATIVE Final    Comment: (NOTE) SARS-CoV-2 target nucleic acids are NOT DETECTED.  The SARS-CoV-2 RNA is generally detectable in upper  and lower respiratory specimens during the acute phase of infection. The lowest concentration of SARS-CoV-2 viral copies this assay can detect is 250 copies / mL. A negative result does not preclude SARS-CoV-2 infection and should not be used as the sole basis for treatment or other patient management decisions.  A negative result may occur with improper specimen collection / handling, submission of specimen other than nasopharyngeal swab, presence of viral mutation(s) within the areas targeted by this assay, and inadequate number of viral copies (<250 copies / mL). A negative result must be combined with clinical observations, patient history, and epidemiological information.  Fact Sheet for Patients:   StrictlyIdeas.no  Fact Sheet for Healthcare Providers: BankingDealers.co.za  This test is not yet approved or  cleared by the [place] FDA and has been authorized for detection and/or diagnosis of SARS-CoV-2 by FDA under an Emergency Use Authorization (EUA).  This EUA will remain in effect (meaning this test can be used) for the duration of the COVID-19 declaration under Section 564(b)(1) of the Act, 21 U.S.C. section 360bbb-3(b)(1), unless the authorization is terminated or revoked sooner.  Performed at [facility], [address], [place], [place] [address]   Culture, blood (routine x 2)     Status: None   Collection Time: 07/26/19  3:26 PM   Specimen: BLOOD RIGHT WRIST  Result Value Ref Range Status   Specimen Description BLOOD RIGHT WRIST  Final   Special Requests   Final    BOTTLES DRAWN AEROBIC AND ANAEROBIC Blood Culture adequate volume   Culture   Final    NO GROWTH 5 DAYS Performed at [facility], [address] [place], [place] [address]    Report Status 07/31/2019 FINAL  Final  Culture, Urine     Status: Abnormal   Collection Time: 07/29/19  9:39 AM   Specimen: Urine, Clean Catch  Result Value Ref Range Status    Specimen Description   Final    URINE, CLEAN CATCH Performed at [facility], [address] [place], [place] [address]    Special Requests   Final  NONE Performed at [facility], [address], [place], [place] [address]    Culture 20,000 COLONIES/mL ENTEROCOCCUS FAECALIS (A)  Final   Report Status 08/01/2019 FINAL  Final   Organism ID, Bacteria ENTEROCOCCUS FAECALIS (A)  Final      Susceptibility   Enterococcus faecalis - MIC*    AMPICILLIN <=2 SENSITIVE Sensitive     NITROFURANTOIN <=16 SENSITIVE Sensitive     VANCOMYCIN 1 SENSITIVE Sensitive     * 20,000 COLONIES/mL ENTEROCOCCUS FAECALIS  SARS Coronavirus 2 by RT PCR (hospital order, performed in [place] hospital lab) Nasopharyngeal Nasopharyngeal Swab     Status: None   Collection Time: 08/01/19 12:02 PM   Specimen: Nasopharyngeal Swab  Result Value Ref Range Status   SARS Coronavirus 2 NEGATIVE NEGATIVE Final    Comment: (NOTE) SARS-CoV-2 target nucleic acids are NOT DETECTED.  The SARS-CoV-2 RNA is generally detectable in upper and lower respiratory specimens during the acute phase of infection. The lowest concentration of SARS-CoV-2 viral copies this assay can detect is 250 copies / mL. A negative result does not preclude SARS-CoV-2 infection and should not be used as the sole basis for treatment or other patient management decisions.  A negative result may occur with improper specimen collection / handling, submission of specimen other than nasopharyngeal swab, presence of viral mutation(s) within the areas targeted by this assay, and inadequate number of viral copies (<250 copies / mL). A negative result must be combined with clinical observations, patient history, and epidemiological information.  Fact Sheet for Patients:   StrictlyIdeas.no  Fact Sheet for Healthcare Providers: BankingDealers.co.za  This test is not yet approved or  cleared by the  [place] FDA and has been authorized for detection and/or diagnosis of SARS-CoV-2 by FDA under an Emergency Use Authorization (EUA).  This EUA will remain in effect (meaning this test can be used) for the duration of the COVID-19 declaration under Section 564(b)(1) of the Act, 21 U.S.C. section 360bbb-3(b)(1), unless the authorization is terminated or revoked sooner.  Performed at [facility], [address], [place], [place] [address]      Labs: Basic Metabolic Panel: Recent Labs  Lab 07/27/19 [phone] 07/27/19 0629 07/28/19 0650 07/28/19 0650 07/29/19 0630 07/29/19 0630 07/30/19 1311 08/01/19 0428  NA 138  --  142  --  138  --  135 137  K 4.0   < > 3.9   < > 3.7   < > 3.7 3.4*  CL 103  --  101  --  100  --  97* 102  CO2 25  --  26  --  27  --  25 27  GLUCOSE 148*  --  109*  --  178*  --  160* 178*  BUN 13  --  9  --  10  --  12 19  CREATININE 0.98  --  0.95  --  0.89  --  0.88 0.99  CALCIUM 8.7*  --  9.2  --  9.4  --  9.1 8.8*  MG 1.4*  --   --   --   --   --  1.5* 1.6*  PHOS  --   --   --   --   --   --  2.9  --    < > = values in this interval not displayed.   Liver Function Tests: Recent Labs  Lab 07/25/19 1707 07/27/19 0629 07/30/19 1311 08/01/19 0428  AST 28 45* 31 36  ALT 17 18 25  36  ALKPHOS 44 38 61 70  BILITOT 0.5 0.9 1.1 0.6  PROT 7.4 6.2* 7.0 6.0*  ALBUMIN 4.3 3.5 3.4* 2.6*   No results for input(s): LIPASE, AMYLASE in the last 168 hours. Recent Labs  Lab 07/31/19 1312  AMMONIA 24   CBC: Recent Labs  Lab 07/25/19 1707 07/25/19 1714 07/27/19 0629 07/28/19 0650 07/29/19 0630 07/30/19 1311 08/01/19 0428  WBC 10.0   < > 7.8 7.3 8.8 8.6 7.3  NEUTROABS 7.4  --   --  5.2  --  6.6  --   HGB 11.9*   < > 9.9* 10.8* 11.1* 11.5* 10.1*  HCT 39.3   < > 32.6* 35.8* 36.1 36.7 33.0*  MCV 90.3   < > 91.1 90.2 89.1 89.5 90.2  PLT 209   < > 157 183 194 220 225   < > = values in this interval not displayed.   Cardiac Enzymes: No results for  input(s): CKTOTAL, CKMB, CKMBINDEX, TROPONINI in the last 168 hours. BNP: Invalid input(s): POCBNP CBG: Recent Labs  Lab 07/31/19 1124 07/31/19 1642 07/31/19 2124 08/01/19 0727 08/01/19 1120  GLUCAP 170* 145* 186* 157* 162*    Time coordinating discharge:  36 minutes  Signed:  Orson Eva, DO [facility] Pager: [phone] 08/01/2019, 2:24 PM

## 2019-08-01 NOTE — Progress Notes (Signed)
[facility] night shift.  The staff reports that the patient had urinary retention with 598 mL of urine seen on the bladder scan.  In and out cath and urinalysis ordered after the staff reported that the urine looked abnormally dark.  Tennis Must, MD

## 2019-08-01 NOTE — Progress Notes (Signed)
IV removed and report called to [place] at [facility].  EMS to arrive shortly.  Family has been at bedside today.  Bladder scan at 1500 showed 278. Dr. Carles Collet gave no order for in and out.  This information passed on in report to [facility]. Received two K runs and Mag piggyback.  More alert today and making appropriate statements and requests.

## 2019-08-02 NOTE — Discharge Summary (Signed)
Physician Discharge Summary  LATEEFA CROSBY ZHY:[id number] DOB: [birth year] DOA: 07/25/2019  PCP: Christain Sacramento, MD  Admit date: 07/25/2019 Discharge date: 08/01/2019  Admitted From: Home Disposition:  SNF  Recommendations for Outpatient Follow-up:  1. Follow up with PCP in 1-2 weeks 2. Please obtain BMP/CBC in one week 3. Please place 30-day event monitor on patient once it arrives from cardiology office    Discharge Condition: Stable CODE STATUS: FULL Diet recommendation: dysphagia 1 with thin liquids  Brief History: 85 year old female with past medical history for hypertension, type 2 diabetes mellitus, DVT/PEand ambulatory dysfunctionwho presented auscultation slurred speech. Apparently patient was at her usual state of health, after 1 to 2 hours of been unattended she was found down on the ground, with altered mentation. Her speech was slurred and the patient was confused. On her initial physical examination her temperature was 98.4, respiratory rate 23, blood pressure 111/58,heart rate 87. She had dry mucous membranes, her lungs are clear to auscultation bilaterally, heart S1-S2, present rhythmic, soft abdomen, no lower extremity edema. She had a 0.5 cm skin laceration left knee. She was nonfocal. Sodium 140, potassium 4.0, chloride 101, bicarb 25, glucose 170, BUN 33, creatinine 1 3, AST 28, ALT 17, troponin I 274. White count 10.0, hemoglobin 11.9, hematocrit 39.3, platelets 209. INR 1.4. SARS COVID-19 negative. Urine analysis 11-20 red cells, with no pyuria.Toxicology screen negative. Head CT, head angiography CT negative for acute changes. Chest radiograph no acute infiltrate, remote rib fractures. EKG 91 bpm, normal axis, normal intervals, sinus rhythm with PAC, no ST segment or T wave changes, low voltage.  Patient received antibiotic therapy with ceftriaxone for urinary tract infection, received neuro checks, PT, OT and speech therapy. Brain MRI with left  temporal restricted diffusion/ left temporal infarct. Neurology was consulted, recommendations to continue anticoagulation and81 mgaspirin x 2 weeks, then stop ASA.  07/29/19:Seen and examined at her bedside with her son and daughter present. Daughter and son concerned about her mentation and stated that she looks like she typically does when she has an urinary tract infection. To note patient was treated for E. coli UTI with Rocephin x3 days. Repeatedher urine culture, pending. Patient's son requested left knee being imaged, ordered left knee x-ray which resulted negative for any acute findings. PT has assessed and recommended SNF. Awaiting speech assessment. Too drowsy to complete study.Appreciate TOC assistance with SNF placement.  07/30/19: Drowsy, ABGshowsno evidence of hypoxia or CO2 retention on RA. CXR possible left lower atelectasis vs small effusion, infiltratescouldnot be excluded. Will obtain procalcitonin level and cbc with differntials.We will also obtain CT head without contrast and ammonia level.   07/31/19:Pt more alert but with nonsensical speech. She is unable to voice her needs or concerns. Speech therapy eval validates language dysfunction and continued dysphagia and expressed concerns that pt will not be able to sustain herself with adequate po intake and is high risk for dehydration. Family continues to have unrealistic expectations.Advanced to dys 1 with thin liquids per speech  07/31/19: pt is pleasantly confused, but family feels she is improving from cognitive/mental standpoint.  Her po intake is fair, but she is eating only 25% of meals.  Palliative following pt and pt remains full code for now.  CT chest suggests aspiration pneumonitits--start amox/clav x 5 days.    Discharge Diagnoses:  Acute left temporalCVA (cerebral vascular accident) ([place]), complicated with metabolic encephalopathy, likely has underlying dementia with  sundowning -Patient presented with confusion, agitation, very poor sleep, had received Haldol as  needed inpatient. -Started on Zyprexa night of 07/27/19, doses were reduced. -Daughter and son concerned about her mentation and stated that she looks like she typically does when she has an urinary tract infection. Urine culture repeated on 07/29/2019, pending. Previously treated for E. coli UTI with 3 days of Rocephin. -Patient has been seen by neurology, 2D echo showed EF of 60 to 65%, noWMA, noPFO or thrombus.  -CT angio head and neck showed no large vessel occlusion, negative perfusion study, no carotid or vertebralstenosis. -LDL 45-at goal -Hemoglobin A1c 7.0-goal less than 7.0 Continue aspirin and statin -Per neurology: Continue aspirin 81 mg daily for [redacted] weeks along with anticoagulation/Eliquis then stop ASA 81 mg daily -30-day cardiac event monitor upon discharge. -PT recommended SNF, TOC assisting with placement. -Aspiration and fall precautions  Acute metabolic encephalopathy, likely multifactorial Recent acute left temporal CVA Repeat CT head without contrast Obtain ammonia level--24 -check B12--1918 -check folate--16.9 -TSH--3.214 -CT chest--small amount of fluid in LLL bronchi with material in patulous esophagus concerning for aspiration -PCT <0.10 -continue IVF -overall slowly improving -family ok with transition to snf -neurology recommended 30-day event monitor after d/c--please place on patient once it is shipped to SNF  Aspiration pneumonitits -amox/clav x 5 days after d/c  TreatedUTI, E. Coli, POA -Patient has a history of recurrent UTIs, urine culture showed 10,000 CFU of E. Coli -Patient has completed 3 days of IV Rocephin -Blood cultures negative to date, -Urine culture was repeated on 07/29/2019 due to family concern as stated above, pending. Continue to follow cultures  Baseline gait impairment post fall -Likely multifactorial, per neurology, mild  parkinsonian, recommend avoid neuroleptics -Bilateral knee x-rays, no acute findings -PT has recommended SNF -CSW assisting with SNF placement  Diabetes mellitus type 2, NIDDM, with neurological complications -Hemoglobin A1c 7.0, goal less than 7.0 -Continue sliding scale insulin while inpatient -Continue to holdofforal hypoglycemics, Metformin  HTN (hypertension) -BP is at goal -Continue to monitor vital signs  Recurrent pulmonary embolism (HCC) -Continue Eliquis -O2 saturation 95% on room air  GERD Continue PPI  Troponin elevation -Likely due to acute CVA, no chest pain, no significant EKG changes, 2D echo with EF of 60 to 65%, no wall motion abnormality. -Cardiology was consulted, seen on 6/16, recommended no further inpatient cardiac work-up. -Consider outpatient follow-up, outpatient Lexiscan Myoview to further risk stratify, will need 30-day monitor at discharge. -Social worker consulted to assist with cardiology referral  Obesity Estimated body mass index is 31.73 kg/m as calculated from the following: Height as of this encounter: 5\' 2"  (1.575 m). Weight as of this encounter: 78.7 kg.  Goals of care Palliative care team has been consulted to assist with establishing goals of care      Discharge Instructions   Allergies as of 08/01/2019      Reactions   Ativan [lorazepam] Other (See Comments)   Hallucinations    Baclofen    Confusion, altered mental state    Hydrocodone-acetaminophen Diarrhea   Other Nausea And Vomiting   general anesthesia   Percocet [oxycodone-acetaminophen] Nausea And Vomiting      Medication List    STOP taking these medications   imipramine 25 MG tablet Commonly known as: TOFRANIL   loperamide 2 MG tablet Commonly known as: IMODIUM A-D   losartan 25 MG tablet Commonly known as: COZAAR   traMADol 50 MG tablet Commonly known as: ULTRAM     TAKE these medications   acetaminophen 650 MG CR tablet Commonly  known as: TYLENOL Take 650  mg by mouth in the morning and at bedtime.   amoxicillin-clavulanate 400-57 MG/5ML suspension Commonly known as: AUGMENTIN Take 10 mLs (800 mg total) by mouth 2 (two) times daily. X 5 days   CALCIUM + D + K PO Take 1 tablet by mouth in the morning and at bedtime.   CORICIDIN COUGH/COLD 4-30 MG Tabs Generic drug: Chlorpheniramine-DM Take 1 tablet by mouth daily as needed (cough).   Delsym 30 MG/5ML liquid Generic drug: dextromethorphan Take 15 mg by mouth as needed for cough.   Dialyvite Vitamin D 5000 125 MCG (5000 UT) capsule Generic drug: Cholecalciferol Take 5,000 Units by mouth in the morning.   Eliquis 5 MG Tabs tablet Generic drug: apixaban Take 5 mg by mouth 2 (two) times daily.   ferrous sulfate 325 (65 FE) MG tablet Take 325 mg by mouth at bedtime.   guaiFENesin 600 MG 12 hr tablet Commonly known as: MUCINEX Take 600 mg by mouth 2 (two) times daily as needed for cough or to loosen phlegm.   hydrOXYzine 25 MG tablet Commonly known as: ATARAX/VISTARIL Take 1 tablet (25 mg total) by mouth 3 (three) times daily as needed for anxiety. What changed:   how much to take  when to take this   [place] 1 application topically daily as needed (back pain).   metFORMIN 1000 MG tablet Commonly known as: GLUCOPHAGE Take 1,000 mg by mouth daily with breakfast.   metoprolol succinate 25 MG 24 hr tablet Commonly known as: Toprol XL Take 1 tablet (25 mg total) by mouth daily.   omeprazole 40 MG capsule Commonly known as: PRILOSEC Take 1 capsule (40 mg total) by mouth daily. What changed: when to take this   Refresh Plus 0.5 % Soln Generic drug: Carboxymethylcellulose Sod PF Place 1 drop into the left eye 3 (three) times daily as needed (dry eye).   rosuvastatin 10 MG tablet Commonly known as: CRESTOR Take 5 mg by mouth in the morning.   sertraline 25 MG tablet Commonly known as: ZOLOFT Take 25 mg by mouth in the morning.    vitamin B-12 1000 MCG tablet Commonly known as: CYANOCOBALAMIN Take 1,000 mcg by mouth in the morning.       Contact information for after-discharge care    Destination    [facility] .   Service: Skilled Nursing Contact information: [address] [place] [phone]                 Allergies  Allergen Reactions  . Ativan [Lorazepam] Other (See Comments)    Hallucinations   . Baclofen     Confusion, altered mental state   . Hydrocodone-Acetaminophen Diarrhea  . Other Nausea And Vomiting    general anesthesia  . Percocet [Oxycodone-Acetaminophen] Nausea And Vomiting    Consultations:  Neuro  palliative   Procedures/Studies: CT ANGIO HEAD W OR WO CONTRAST  Result Date: 07/25/2019 CLINICAL DATA:  Dysarthria and confusion. EXAM: CT ANGIOGRAPHY HEAD AND NECK CT PERFUSION BRAIN TECHNIQUE: Multidetector CT imaging of the head and neck was performed using the standard protocol during bolus administration of intravenous contrast. Multiplanar CT image reconstructions and MIPs were obtained to evaluate the vascular anatomy. Carotid stenosis measurements (when applicable) are obtained utilizing NASCET criteria, using the distal internal carotid diameter as the denominator. Multiphase CT imaging of the brain was performed following IV bolus contrast injection. Subsequent parametric perfusion maps were calculated using RAPID software. CONTRAST:  179mL OMNIPAQUE IOHEXOL 350 MG/ML SOLN COMPARISON:  Head CT earlier same day. FINDINGS: CTA NECK FINDINGS Aortic arch: Mild aortic atherosclerosis. Right carotid system: Normal.  Tortuous vessels but no stenosis. Left carotid system: Normal tortuous vessels but no stenosis. Vertebral arteries: Both vertebral artery origins are widely patent. Both vertebral arteries are widely patent through the cervical region to the foramen magnum. Skeleton: Negative Other neck: No  mass or adenopathy. Upper chest: Negative Review of the MIP images confirms the above findings CTA HEAD FINDINGS Anterior circulation: Both internal carotid arteries are widely patent through the skull base and siphon regions. The anterior and middle cerebral vessels are patent. No large or medium vessel occlusion. No proximal stenosis. Posterior circulation: Both vertebral arteries widely patent to the basilar. No basilar stenosis. Posterior circulation branch vessels are normal. Fetal origin right PCA. Venous sinuses: Normal Anatomic variants: None Review of the MIP images confirms the above findings CT Brain Perfusion Findings: ASPECTS: 10 CBF (<30%) Volume: 13mL Perfusion (Tmax>6.0s) volume: 78mL Mismatch Volume: 74mL Infarction Location:None IMPRESSION: No large vessel occlusion.  Negative perfusion study. No carotid or vertebral stenosis. Tortuous vessels suggesting hypertension. These results were called by telephone at the time of interpretation on 07/25/2019 at 5:52 pm to provider [facility] ZAMMIT , who verbally acknowledged these results. Electronically Signed   By: Nelson Chimes M.D.   On: 07/25/2019 17:52   CT ANGIO NECK W OR WO CONTRAST  Result Date: 07/25/2019 CLINICAL DATA:  Dysarthria and confusion. EXAM: CT ANGIOGRAPHY HEAD AND NECK CT PERFUSION BRAIN TECHNIQUE: Multidetector CT imaging of the head and neck was performed using the standard protocol during bolus administration of intravenous contrast. Multiplanar CT image reconstructions and MIPs were obtained to evaluate the vascular anatomy. Carotid stenosis measurements (when applicable) are obtained utilizing NASCET criteria, using the distal internal carotid diameter as the denominator. Multiphase CT imaging of the brain was performed following IV bolus contrast injection. Subsequent parametric perfusion maps were calculated using RAPID software. CONTRAST:  147mL OMNIPAQUE IOHEXOL 350 MG/ML SOLN COMPARISON:  Head CT earlier same day. FINDINGS: CTA  NECK FINDINGS Aortic arch: Mild aortic atherosclerosis. Right carotid system: Normal.  Tortuous vessels but no stenosis. Left carotid system: Normal tortuous vessels but no stenosis. Vertebral arteries: Both vertebral artery origins are widely patent. Both vertebral arteries are widely patent through the cervical region to the foramen magnum. Skeleton: Negative Other neck: No mass or adenopathy. Upper chest: Negative Review of the MIP images confirms the above findings CTA HEAD FINDINGS Anterior circulation: Both internal carotid arteries are widely patent through the skull base and siphon regions. The anterior and middle cerebral vessels are patent. No large or medium vessel occlusion. No proximal stenosis. Posterior circulation: Both vertebral arteries widely patent to the basilar. No basilar stenosis. Posterior circulation branch vessels are normal. Fetal origin right PCA. Venous sinuses: Normal Anatomic variants: None Review of the MIP images confirms the above findings CT Brain Perfusion Findings: ASPECTS: 10 CBF (<30%) Volume: 19mL Perfusion (Tmax>6.0s) volume: 83mL Mismatch Volume: 41mL Infarction Location:None IMPRESSION: No large vessel occlusion.  Negative perfusion study. No carotid or vertebral stenosis. Tortuous vessels suggesting hypertension. These results were called by telephone at the time of interpretation on 07/25/2019 at 5:52 pm to provider [facility] ZAMMIT , who verbally acknowledged these results. Electronically Signed   By: Nelson Chimes M.D.   On: 07/25/2019 17:52   CT CHEST WO CONTRAST  Result Date: 07/31/2019 CLINICAL DATA:  Cough, persistent pneumonia with effusion EXAM: CT CHEST WITHOUT CONTRAST  TECHNIQUE: Multidetector CT imaging of the chest was performed following the standard protocol without IV contrast. COMPARISON:  Prior chest CT from 2008 and recent chest x-rays from June of 2021 FINDINGS: Cardiovascular: Heart size mildly enlarged without pericardial effusion. Aorta with scattered  atherosclerotic calcifications without aneurysmal dilation. Central pulmonary vasculature is of normal caliber. Vessels not well assessed given the lack of intravenous contrast. Mediastinum/Nodes: Large hiatal hernia extending into the chest, approximately 50% of the stomach herniated into the chest. No thoracic inlet adenopathy. No axillary lymphadenopathy. Lungs/Pleura: Anterior RIGHT upper lobe pulmonary nodule (image 68, series 4) 10 x 10 mm unchanged since 2008. Parenchymal assessment is limited by respiratory motion. Airways, large airways are patent. Smaller airways in the LEFT lower lobe may have some material within the lumen. Small LEFT-sided pleural effusion and basilar volume loss overlying the hiatal hernia. Upper Abdomen: Post cholecystectomy. Limited assessment of upper abdominal contents shows renal parenchymal scarring and mild perinephric stranding along with hiatal hernia mentioned above. Musculoskeletal: No chest wall mass. No acute bone process. Signs of prior trauma to RIGHT-sided ribs with some callus formation about posterolateral seventh and eighth ribs. Signs of cement augmentation of the T12 vertebral level. IMPRESSION: 1. Small LEFT-sided pleural effusion and basilar volume loss adjacent to hiatal hernia. Correlate with any risk factors for aspiration given the potential for some fluid in LEFT lower lobe bronchi and material in the mildly patulous esophagus in the setting of large hiatal hernia. Esophagus. 2. Stable RIGHT upper lobe pulmonary nodule since 2008. Compatible with benign finding. 3. Signs of prior trauma to RIGHT-sided ribs with some callus formation about posterolateral seventh and eighth ribs. 4. Aortic atherosclerosis. Aortic Atherosclerosis (ICD10-I70.0). Electronically Signed   By: Zetta Bills M.D.   On: 07/31/2019 16:15   MR BRAIN WO CONTRAST  Result Date: 07/25/2019 CLINICAL DATA:  Altered mental status. Slurred speech and confusion. EXAM: MRI HEAD WITHOUT  CONTRAST TECHNIQUE: Multiplanar, multiecho pulse sequences of the brain and surrounding structures were obtained without intravenous contrast. COMPARISON:  CT studies same day FINDINGS: Brain: Severely motion degraded exam. Diffusion imaging only was obtained, and this is markedly suboptimal. One could question some restricted diffusion in the left temporal lobe. This is not a reliable finding. Vascular: No data Skull and upper cervical spine: No data Sinuses/Orbits: No data Other: No data IMPRESSION: Severely motion degraded exam. Only diffusion imaging was achieved, and this is of very limited utility. One could question some restricted diffusion in the left temporal lobe, but this is not a reliable finding at this quality. Electronically Signed   By: Nelson Chimes M.D.   On: 07/25/2019 20:44   DG Pelvis Portable  Result Date: 07/25/2019 CLINICAL DATA:  Fall, found at bottom of steps EXAM: PORTABLE PELVIS 1-2 VIEWS COMPARISON:  None. FINDINGS: Contrast material within the urinary bladder obscures portions of the pelvic bones. The SI joints are non widened. The pubic symphysis is non widened. Inferior pubic rami appear intact. The femoral heads project in joint. IMPRESSION: No acute osseous abnormality Electronically Signed   By: Donavan Foil M.D.   On: 07/25/2019 18:53   CT CEREBRAL PERFUSION W CONTRAST  Result Date: 07/25/2019 CLINICAL DATA:  Dysarthria and confusion. EXAM: CT ANGIOGRAPHY HEAD AND NECK CT PERFUSION BRAIN TECHNIQUE: Multidetector CT imaging of the head and neck was performed using the standard protocol during bolus administration of intravenous contrast. Multiplanar CT image reconstructions and MIPs were obtained to evaluate the vascular anatomy. Carotid stenosis measurements (when applicable) are obtained  utilizing NASCET criteria, using the distal internal carotid diameter as the denominator. Multiphase CT imaging of the brain was performed following IV bolus contrast injection.  Subsequent parametric perfusion maps were calculated using RAPID software. CONTRAST:  165mL OMNIPAQUE IOHEXOL 350 MG/ML SOLN COMPARISON:  Head CT earlier same day. FINDINGS: CTA NECK FINDINGS Aortic arch: Mild aortic atherosclerosis. Right carotid system: Normal.  Tortuous vessels but no stenosis. Left carotid system: Normal tortuous vessels but no stenosis. Vertebral arteries: Both vertebral artery origins are widely patent. Both vertebral arteries are widely patent through the cervical region to the foramen magnum. Skeleton: Negative Other neck: No mass or adenopathy. Upper chest: Negative Review of the MIP images confirms the above findings CTA HEAD FINDINGS Anterior circulation: Both internal carotid arteries are widely patent through the skull base and siphon regions. The anterior and middle cerebral vessels are patent. No large or medium vessel occlusion. No proximal stenosis. Posterior circulation: Both vertebral arteries widely patent to the basilar. No basilar stenosis. Posterior circulation branch vessels are normal. Fetal origin right PCA. Venous sinuses: Normal Anatomic variants: None Review of the MIP images confirms the above findings CT Brain Perfusion Findings: ASPECTS: 10 CBF (<30%) Volume: 8mL Perfusion (Tmax>6.0s) volume: 30mL Mismatch Volume: 63mL Infarction Location:None IMPRESSION: No large vessel occlusion.  Negative perfusion study. No carotid or vertebral stenosis. Tortuous vessels suggesting hypertension. These results were called by telephone at the time of interpretation on 07/25/2019 at 5:52 pm to provider [facility] ZAMMIT , who verbally acknowledged these results. Electronically Signed   By: Nelson Chimes M.D.   On: 07/25/2019 17:52   DG CHEST PORT 1 VIEW  Result Date: 07/30/2019 CLINICAL DATA:  Altered mental status. EXAM: PORTABLE CHEST 1 VIEW COMPARISON:  July 25, 2019 FINDINGS: Rounded opacity projected near the left first costochondral junction was not present on the March 05, 2017  study. There is a small left effusion with underlying opacity, new since July 25, 2019. Healed right-sided rib fractures are again noted. No pneumothorax. The right lung is clear. The cardiomediastinal silhouette is stable. IMPRESSION: 1. The rounded opacity in the region of the left first costochondral junction may represent degenerative change in this region. An infiltrate or developing nodule are considered less likely. Recommend a PA and lateral chest x-ray before discharge to ensure resolution of this finding. 2. Small left effusion with underlying atelectasis, new since July 25, 2019. 3. No other acute abnormalities. Electronically Signed   By: Dorise Bullion III M.D   On: 07/30/2019 13:28   DG Chest Port 1 View  Result Date: 07/25/2019 CLINICAL DATA:  Fall EXAM: PORTABLE CHEST 1 VIEW COMPARISON:  Radiograph 03/05/2017, 11/25/2017 FINDINGS: Low lung volumes with streaky areas of atelectatic change in central vascular crowding. More bandlike opacity present in the left lung base likely reflect further atelectasis or scarring. No focal consolidation or convincing features of edema. No pneumothorax or visible effusion. Cardiomediastinal contours are similar to prior accounting for differences in technique. Remote lower thoracic compression deformity and vertebroplasty changes as well as several healed right posterolateral rib deformities. Multilevel degenerative changes are present in the imaged portions of the spine. No acute osseous or soft tissue abnormality. Telemetry leads overlie the chest. IMPRESSION: Low lung volumes with streaky areas of atelectatic change and central vascular crowding. More bandlike opacity present in the left lung base likely reflect further atelectasis or scarring. Remote rib fractures and a prior lower thoracic vertebroplasty. Similar to prior. Electronically Signed   By: Elwin Sleight.D.  On: 07/25/2019 18:54   DG Knee Complete 4 Views Left  Result Date:  07/29/2019 CLINICAL DATA:  Arthritis, LEFT knee pain.  Fall. EXAM: LEFT KNEE - COMPLETE 4+ VIEW COMPARISON:  None. FINDINGS: There is narrowing of the medial and lateral compartment. Mild osteophytosis. Patellofemoral spurring additionally. No joint effusion. No acute fracture dislocation. IMPRESSION: Tricompartmental osteoarthritis of the knee.  No acute findings. Electronically Signed   By: Suzy Bouchard M.D.   On: 07/29/2019 10:21   DG Knee Complete 4 Views Right  Result Date: 07/26/2019 CLINICAL DATA:  [place], found down EXAM: RIGHT KNEE - COMPLETE 4+ VIEW COMPARISON:  12/21/2018 FINDINGS: Frontal, bilateral oblique, lateral views of the right knee are obtained. Two cannulated screws traverse the patella consistent with previous fracture repair. No acute fracture, subluxation, or dislocation. Three compartmental osteoarthritis, with chondrocalcinosis in the medial and lateral compartments. No joint effusion. IMPRESSION: 1. No acute bony abnormality. 2. ORIF previous patellar fracture. 3. Three compartmental osteoarthritis. Electronically Signed   By: Randa Ngo M.D.   On: 07/26/2019 20:44   ECHOCARDIOGRAM COMPLETE  Result Date: 07/26/2019    ECHOCARDIOGRAM REPORT   Patient Name:   JESLIE LOWE Date of Exam: 07/26/2019 Medical Rec #:  [id number]       Height:       62.0 in Accession #:    [id number]      Weight:       173.5 lb Date of Birth:  1934/12/25        BSA:          1.800 m Patient Age:    85 years        BP:           125/52 mmHg Patient Gender: F               HR:           103 bpm. Exam Location:  [facility] Procedure: 2D Echo Indications:    Elevated Troponin  History:        Patient has prior history of Echocardiogram examinations, most                 recent 12/25/2018. Risk Factors:Hypertension, Diabetes and                 Non-Smoker. GERD, Recurrent pulmonary embolism , UTI.  Sonographer:    Leavy Cella RDCS (AE) Referring Phys: [place]  1. Left  ventricular ejection fraction, by estimation, is 60 to 65%. The left ventricle has normal function. The left ventricle has no regional wall motion abnormalities. Left ventricular diastolic parameters are consistent with age-related delayed relaxation (normal).  2. Right ventricular systolic function is normal. The right ventricular size is normal. There is mildly elevated pulmonary artery systolic pressure.  3. The mitral valve is normal in structure. Trivial mitral valve regurgitation. No evidence of mitral stenosis.  4. The aortic valve is tricuspid. Aortic valve regurgitation is not visualized. Mild to moderate aortic valve sclerosis/calcification is present, without any evidence of aortic stenosis.  5. The inferior vena cava is normal in size with greater than 50% respiratory variability, suggesting right atrial pressure of 3 mmHg. FINDINGS  Left Ventricle: Left ventricular ejection fraction, by estimation, is 60 to 65%. The left ventricle has normal function. The left ventricle has no regional wall motion abnormalities. The left ventricular internal cavity size was normal in size. There is  no left ventricular hypertrophy. Left ventricular diastolic parameters are  consistent with age-related delayed relaxation (normal). Right Ventricle: The right ventricular size is normal. No increase in right ventricular wall thickness. Right ventricular systolic function is normal. There is mildly elevated pulmonary artery systolic pressure. The tricuspid regurgitant velocity is 2.86  m/s, and with an assumed right atrial pressure of 10 mmHg, the estimated right ventricular systolic pressure is 25.3 mmHg. Left Atrium: Left atrial size was normal in size. Right Atrium: Right atrial size was normal in size. Pericardium: There is no evidence of pericardial effusion. Mitral Valve: The mitral valve is normal in structure. Normal mobility of the mitral valve leaflets. Trivial mitral valve regurgitation. No evidence of mitral valve  stenosis. Tricuspid Valve: The tricuspid valve is normal in structure. Tricuspid valve regurgitation is mild . No evidence of tricuspid stenosis. Aortic Valve: The aortic valve is tricuspid. Aortic valve regurgitation is not visualized. Mild to moderate aortic valve sclerosis/calcification is present, without any evidence of aortic stenosis. Pulmonic Valve: The pulmonic valve was normal in structure. Pulmonic valve regurgitation is not visualized. No evidence of pulmonic stenosis. Aorta: The aortic root is normal in size and structure. Venous: The inferior vena cava is normal in size with greater than 50% respiratory variability, suggesting right atrial pressure of 3 mmHg. IAS/Shunts: No atrial level shunt detected by color flow Doppler.  LEFT VENTRICLE PLAX 2D LVIDd:         4.21 cm  Diastology LVIDs:         2.71 cm  LV e' lateral:   10.70 cm/s LV PW:         1.05 cm  LV E/e' lateral: 8.0 LV IVS:        0.87 cm  LV e' medial:    6.74 cm/s LVOT diam:     1.80 cm  LV E/e' medial:  12.7 LVOT Area:     2.54 cm  RIGHT VENTRICLE RV S prime:     15.40 cm/s TAPSE (M-mode): 2.8 cm LEFT ATRIUM             Index       RIGHT ATRIUM          Index LA diam:        2.80 cm 1.56 cm/m  RA Area:     9.11 cm LA Vol (A2C):   63.9 ml 35.51 ml/m RA Volume:   16.40 ml 9.11 ml/m LA Vol (A4C):   42.6 ml 23.67 ml/m LA Biplane Vol: 54.6 ml 30.34 ml/m   AORTA Ao Root diam: 2.90 cm MITRAL VALVE               TRICUSPID VALVE MV Area (PHT): 3.53 cm    TR Peak grad:   32.7 mmHg MV Decel Time: 215 msec    TR Vmax:        286.00 cm/s MV E velocity: 85.90 cm/s MV A velocity: 59.20 cm/s  SHUNTS MV E/A ratio:  1.45        Systemic Diam: 1.80 cm Jenkins Rouge MD Electronically signed by Jenkins Rouge MD Signature Date/Time: 07/26/2019/12:09:47 PM    Final    CT HEAD CODE STROKE WO CONTRAST  Result Date: 07/25/2019 CLINICAL DATA:  Code stroke.  Slurred speech. EXAM: CT HEAD WITHOUT CONTRAST TECHNIQUE: Contiguous axial images were obtained from  the base of the skull through the vertex without intravenous contrast. COMPARISON:  12/24/2018 FINDINGS: Brain: Age related atrophy. Chronic small-vessel ischemic changes of the cerebral hemispheric white matter. No sign of acute infarction, mass lesion, hemorrhage,  hydrocephalus or extra-axial collection. Vascular: There is atherosclerotic calcification of the major vessels at the base of the brain. Skull: Negative Sinuses/Orbits: Clear sinuses.  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 Other: None ASPECTS ([place] Stroke Program Early CT Score) - Ganglionic level infarction (caudate, lentiform nuclei, internal capsule, insula, M1-M3 cortex): 7 - Supraganglionic infarction (M4-M6 cortex): 3 Total score (0-10 with 10 being normal): 10 IMPRESSION: 1. No acute finding by CT. Atrophy and chronic small-vessel ischemic changes of the white matter. 2. ASPECTS is 10 3. These results were called by telephone at the time of interpretation on 07/25/2019 at 5:12 pm to provider [facility] ZAMMIT , who verbally acknowledged these results. Electronically Signed   By: Nelson Chimes M.D.   On: 07/25/2019 17:12         Discharge Exam: Vitals:   08/01/19 0433 08/01/19 1321  BP: (!) 105/52 (!) 113/48  Pulse: 79 71  Resp: 16 20  Temp: 98 F (36.7 C) 97.6 F (36.4 C)  SpO2: 95% 96%   Vitals:   07/31/19 2117 08/01/19 0013 08/01/19 0433 08/01/19 1321  BP: (!) 149/60 (!) 143/73 (!) 105/52 (!) 113/48  Pulse: (!) 108 (!) 109 79 71  Resp: 17 20 16 20   Temp: (!) 97.5 F (36.4 C) 98.8 F (37.1 C) 98 F (36.7 C) 97.6 F (36.4 C)  TempSrc: Oral Oral  Oral  SpO2: 96% 93% 95% 96%  Weight:      Height:        General: Pt is alert, awake, not in acute distress Cardiovascular: RRR, S1/S2 +, no rubs, no gallops Respiratory: CTA bilaterally, no wheezing, no rhonchi Abdominal: Soft, NT, ND, bowel sounds + Extremities: no edema, no cyanosis   The results of significant diagnostics from this hospitalization (including imaging,  microbiology, ancillary and laboratory) are listed below for reference.    Significant Diagnostic Studies: CT ANGIO HEAD W OR WO CONTRAST  Result Date: 07/25/2019 CLINICAL DATA:  Dysarthria and confusion. EXAM: CT ANGIOGRAPHY HEAD AND NECK CT PERFUSION BRAIN TECHNIQUE: Multidetector CT imaging of the head and neck was performed using the standard protocol during bolus administration of intravenous contrast. Multiplanar CT image reconstructions and MIPs were obtained to evaluate the vascular anatomy. Carotid stenosis measurements (when applicable) are obtained utilizing NASCET criteria, using the distal internal carotid diameter as the denominator. Multiphase CT imaging of the brain was performed following IV bolus contrast injection. Subsequent parametric perfusion maps were calculated using RAPID software. CONTRAST:  172mL OMNIPAQUE IOHEXOL 350 MG/ML SOLN COMPARISON:  Head CT earlier same day. FINDINGS: CTA NECK FINDINGS Aortic arch: Mild aortic atherosclerosis. Right carotid system: Normal.  Tortuous vessels but no stenosis. Left carotid system: Normal tortuous vessels but no stenosis. Vertebral arteries: Both vertebral artery origins are widely patent. Both vertebral arteries are widely patent through the cervical region to the foramen magnum. Skeleton: Negative Other neck: No mass or adenopathy. Upper chest: Negative Review of the MIP images confirms the above findings CTA HEAD FINDINGS Anterior circulation: Both internal carotid arteries are widely patent through the skull base and siphon regions. The anterior and middle cerebral vessels are patent. No large or medium vessel occlusion. No proximal stenosis. Posterior circulation: Both vertebral arteries widely patent to the basilar. No basilar stenosis. Posterior circulation branch vessels are normal. Fetal origin right PCA. Venous sinuses: Normal Anatomic variants: None Review of the MIP images confirms the above findings CT Brain Perfusion Findings:  ASPECTS: 10 CBF (<30%) Volume: 32mL Perfusion (Tmax>6.0s) volume: 35mL Mismatch Volume: 67mL Infarction  Location:None IMPRESSION: No large vessel occlusion.  Negative perfusion study. No carotid or vertebral stenosis. Tortuous vessels suggesting hypertension. These results were called by telephone at the time of interpretation on 07/25/2019 at 5:52 pm to provider [facility] ZAMMIT , who verbally acknowledged these results. Electronically Signed   By: Nelson Chimes M.D.   On: 07/25/2019 17:52   CT ANGIO NECK W OR WO CONTRAST  Result Date: 07/25/2019 CLINICAL DATA:  Dysarthria and confusion. EXAM: CT ANGIOGRAPHY HEAD AND NECK CT PERFUSION BRAIN TECHNIQUE: Multidetector CT imaging of the head and neck was performed using the standard protocol during bolus administration of intravenous contrast. Multiplanar CT image reconstructions and MIPs were obtained to evaluate the vascular anatomy. Carotid stenosis measurements (when applicable) are obtained utilizing NASCET criteria, using the distal internal carotid diameter as the denominator. Multiphase CT imaging of the brain was performed following IV bolus contrast injection. Subsequent parametric perfusion maps were calculated using RAPID software. CONTRAST:  153mL OMNIPAQUE IOHEXOL 350 MG/ML SOLN COMPARISON:  Head CT earlier same day. FINDINGS: CTA NECK FINDINGS Aortic arch: Mild aortic atherosclerosis. Right carotid system: Normal.  Tortuous vessels but no stenosis. Left carotid system: Normal tortuous vessels but no stenosis. Vertebral arteries: Both vertebral artery origins are widely patent. Both vertebral arteries are widely patent through the cervical region to the foramen magnum. Skeleton: Negative Other neck: No mass or adenopathy. Upper chest: Negative Review of the MIP images confirms the above findings CTA HEAD FINDINGS Anterior circulation: Both internal carotid arteries are widely patent through the skull base and siphon regions. The anterior and middle cerebral  vessels are patent. No large or medium vessel occlusion. No proximal stenosis. Posterior circulation: Both vertebral arteries widely patent to the basilar. No basilar stenosis. Posterior circulation branch vessels are normal. Fetal origin right PCA. Venous sinuses: Normal Anatomic variants: None Review of the MIP images confirms the above findings CT Brain Perfusion Findings: ASPECTS: 10 CBF (<30%) Volume: 92mL Perfusion (Tmax>6.0s) volume: 36mL Mismatch Volume: 104mL Infarction Location:None IMPRESSION: No large vessel occlusion.  Negative perfusion study. No carotid or vertebral stenosis. Tortuous vessels suggesting hypertension. These results were called by telephone at the time of interpretation on 07/25/2019 at 5:52 pm to provider [facility] ZAMMIT , who verbally acknowledged these results. Electronically Signed   By: Nelson Chimes M.D.   On: 07/25/2019 17:52   CT CHEST WO CONTRAST  Result Date: 07/31/2019 CLINICAL DATA:  Cough, persistent pneumonia with effusion EXAM: CT CHEST WITHOUT CONTRAST TECHNIQUE: Multidetector CT imaging of the chest was performed following the standard protocol without IV contrast. COMPARISON:  Prior chest CT from 2008 and recent chest x-rays from June of 2021 FINDINGS: Cardiovascular: Heart size mildly enlarged without pericardial effusion. Aorta with scattered atherosclerotic calcifications without aneurysmal dilation. Central pulmonary vasculature is of normal caliber. Vessels not well assessed given the lack of intravenous contrast. Mediastinum/Nodes: Large hiatal hernia extending into the chest, approximately 50% of the stomach herniated into the chest. No thoracic inlet adenopathy. No axillary lymphadenopathy. Lungs/Pleura: Anterior RIGHT upper lobe pulmonary nodule (image 68, series 4) 10 x 10 mm unchanged since 2008. Parenchymal assessment is limited by respiratory motion. Airways, large airways are patent. Smaller airways in the LEFT lower lobe may have some material within the  lumen. Small LEFT-sided pleural effusion and basilar volume loss overlying the hiatal hernia. Upper Abdomen: Post cholecystectomy. Limited assessment of upper abdominal contents shows renal parenchymal scarring and mild perinephric stranding along with hiatal hernia mentioned above. Musculoskeletal: No chest wall mass. No acute  bone process. Signs of prior trauma to RIGHT-sided ribs with some callus formation about posterolateral seventh and eighth ribs. Signs of cement augmentation of the T12 vertebral level. IMPRESSION: 1. Small LEFT-sided pleural effusion and basilar volume loss adjacent to hiatal hernia. Correlate with any risk factors for aspiration given the potential for some fluid in LEFT lower lobe bronchi and material in the mildly patulous esophagus in the setting of large hiatal hernia. Esophagus. 2. Stable RIGHT upper lobe pulmonary nodule since 2008. Compatible with benign finding. 3. Signs of prior trauma to RIGHT-sided ribs with some callus formation about posterolateral seventh and eighth ribs. 4. Aortic atherosclerosis. Aortic Atherosclerosis (ICD10-I70.0). Electronically Signed   By: Zetta Bills M.D.   On: 07/31/2019 16:15   MR BRAIN WO CONTRAST  Result Date: 07/25/2019 CLINICAL DATA:  Altered mental status. Slurred speech and confusion. EXAM: MRI HEAD WITHOUT CONTRAST TECHNIQUE: Multiplanar, multiecho pulse sequences of the brain and surrounding structures were obtained without intravenous contrast. COMPARISON:  CT studies same day FINDINGS: Brain: Severely motion degraded exam. Diffusion imaging only was obtained, and this is markedly suboptimal. One could question some restricted diffusion in the left temporal lobe. This is not a reliable finding. Vascular: No data Skull and upper cervical spine: No data Sinuses/Orbits: No data Other: No data IMPRESSION: Severely motion degraded exam. Only diffusion imaging was achieved, and this is of very limited utility. One could question some  restricted diffusion in the left temporal lobe, but this is not a reliable finding at this quality. Electronically Signed   By: Nelson Chimes M.D.   On: 07/25/2019 20:44   DG Pelvis Portable  Result Date: 07/25/2019 CLINICAL DATA:  Fall, found at bottom of steps EXAM: PORTABLE PELVIS 1-2 VIEWS COMPARISON:  None. FINDINGS: Contrast material within the urinary bladder obscures portions of the pelvic bones. The SI joints are non widened. The pubic symphysis is non widened. Inferior pubic rami appear intact. The femoral heads project in joint. IMPRESSION: No acute osseous abnormality Electronically Signed   By: Donavan Foil M.D.   On: 07/25/2019 18:53   CT CEREBRAL PERFUSION W CONTRAST  Result Date: 07/25/2019 CLINICAL DATA:  Dysarthria and confusion. EXAM: CT ANGIOGRAPHY HEAD AND NECK CT PERFUSION BRAIN TECHNIQUE: Multidetector CT imaging of the head and neck was performed using the standard protocol during bolus administration of intravenous contrast. Multiplanar CT image reconstructions and MIPs were obtained to evaluate the vascular anatomy. Carotid stenosis measurements (when applicable) are obtained utilizing NASCET criteria, using the distal internal carotid diameter as the denominator. Multiphase CT imaging of the brain was performed following IV bolus contrast injection. Subsequent parametric perfusion maps were calculated using RAPID software. CONTRAST:  165mL OMNIPAQUE IOHEXOL 350 MG/ML SOLN COMPARISON:  Head CT earlier same day. FINDINGS: CTA NECK FINDINGS Aortic arch: Mild aortic atherosclerosis. Right carotid system: Normal.  Tortuous vessels but no stenosis. Left carotid system: Normal tortuous vessels but no stenosis. Vertebral arteries: Both vertebral artery origins are widely patent. Both vertebral arteries are widely patent through the cervical region to the foramen magnum. Skeleton: Negative Other neck: No mass or adenopathy. Upper chest: Negative Review of the MIP images confirms the above  findings CTA HEAD FINDINGS Anterior circulation: Both internal carotid arteries are widely patent through the skull base and siphon regions. The anterior and middle cerebral vessels are patent. No large or medium vessel occlusion. No proximal stenosis. Posterior circulation: Both vertebral arteries widely patent to the basilar. No basilar stenosis. Posterior circulation branch vessels are normal. Fetal  origin right PCA. Venous sinuses: Normal Anatomic variants: None Review of the MIP images confirms the above findings CT Brain Perfusion Findings: ASPECTS: 10 CBF (<30%) Volume: 79mL Perfusion (Tmax>6.0s) volume: 88mL Mismatch Volume: 43mL Infarction Location:None IMPRESSION: No large vessel occlusion.  Negative perfusion study. No carotid or vertebral stenosis. Tortuous vessels suggesting hypertension. These results were called by telephone at the time of interpretation on 07/25/2019 at 5:52 pm to provider [facility] ZAMMIT , who verbally acknowledged these results. Electronically Signed   By: Nelson Chimes M.D.   On: 07/25/2019 17:52   DG CHEST PORT 1 VIEW  Result Date: 07/30/2019 CLINICAL DATA:  Altered mental status. EXAM: PORTABLE CHEST 1 VIEW COMPARISON:  July 25, 2019 FINDINGS: Rounded opacity projected near the left first costochondral junction was not present on the March 05, 2017 study. There is a small left effusion with underlying opacity, new since July 25, 2019. Healed right-sided rib fractures are again noted. No pneumothorax. The right lung is clear. The cardiomediastinal silhouette is stable. IMPRESSION: 1. The rounded opacity in the region of the left first costochondral junction may represent degenerative change in this region. An infiltrate or developing nodule are considered less likely. Recommend a PA and lateral chest x-ray before discharge to ensure resolution of this finding. 2. Small left effusion with underlying atelectasis, new since July 25, 2019. 3. No other acute abnormalities.  Electronically Signed   By: Dorise Bullion III M.D   On: 07/30/2019 13:28   DG Chest Port 1 View  Result Date: 07/25/2019 CLINICAL DATA:  Fall EXAM: PORTABLE CHEST 1 VIEW COMPARISON:  Radiograph 03/05/2017, 11/25/2017 FINDINGS: Low lung volumes with streaky areas of atelectatic change in central vascular crowding. More bandlike opacity present in the left lung base likely reflect further atelectasis or scarring. No focal consolidation or convincing features of edema. No pneumothorax or visible effusion. Cardiomediastinal contours are similar to prior accounting for differences in technique. Remote lower thoracic compression deformity and vertebroplasty changes as well as several healed right posterolateral rib deformities. Multilevel degenerative changes are present in the imaged portions of the spine. No acute osseous or soft tissue abnormality. Telemetry leads overlie the chest. IMPRESSION: Low lung volumes with streaky areas of atelectatic change and central vascular crowding. More bandlike opacity present in the left lung base likely reflect further atelectasis or scarring. Remote rib fractures and a prior lower thoracic vertebroplasty. Similar to prior. Electronically Signed   By: Lovena Le M.D.   On: 07/25/2019 18:54   DG Knee Complete 4 Views Left  Result Date: 07/29/2019 CLINICAL DATA:  Arthritis, LEFT knee pain.  Fall. EXAM: LEFT KNEE - COMPLETE 4+ VIEW COMPARISON:  None. FINDINGS: There is narrowing of the medial and lateral compartment. Mild osteophytosis. Patellofemoral spurring additionally. No joint effusion. No acute fracture dislocation. IMPRESSION: Tricompartmental osteoarthritis of the knee.  No acute findings. Electronically Signed   By: Suzy Bouchard M.D.   On: 07/29/2019 10:21   DG Knee Complete 4 Views Right  Result Date: 07/26/2019 CLINICAL DATA:  [place], found down EXAM: RIGHT KNEE - COMPLETE 4+ VIEW COMPARISON:  12/21/2018 FINDINGS: Frontal, bilateral oblique, lateral views  of the right knee are obtained. Two cannulated screws traverse the patella consistent with previous fracture repair. No acute fracture, subluxation, or dislocation. Three compartmental osteoarthritis, with chondrocalcinosis in the medial and lateral compartments. No joint effusion. IMPRESSION: 1. No acute bony abnormality. 2. ORIF previous patellar fracture. 3. Three compartmental osteoarthritis. Electronically Signed   By: Diana Eves.D.  On: 07/26/2019 20:44   ECHOCARDIOGRAM COMPLETE  Result Date: 07/26/2019    ECHOCARDIOGRAM REPORT   Patient Name:   KELSIE ZABOROWSKI Date of Exam: 07/26/2019 Medical Rec #:  [id number]       Height:       62.0 in Accession #:    [id number]      Weight:       173.5 lb Date of Birth:  1934/12/15        BSA:          1.800 m Patient Age:    77 years        BP:           125/52 mmHg Patient Gender: F               HR:           103 bpm. Exam Location:  [facility] Procedure: 2D Echo Indications:    Elevated Troponin  History:        Patient has prior history of Echocardiogram examinations, most                 recent 12/25/2018. Risk Factors:Hypertension, Diabetes and                 Non-Smoker. GERD, Recurrent pulmonary embolism , UTI.  Sonographer:    Leavy Cella RDCS (AE) Referring Phys: [place]  1. Left ventricular ejection fraction, by estimation, is 60 to 65%. The left ventricle has normal function. The left ventricle has no regional wall motion abnormalities. Left ventricular diastolic parameters are consistent with age-related delayed relaxation (normal).  2. Right ventricular systolic function is normal. The right ventricular size is normal. There is mildly elevated pulmonary artery systolic pressure.  3. The mitral valve is normal in structure. Trivial mitral valve regurgitation. No evidence of mitral stenosis.  4. The aortic valve is tricuspid. Aortic valve regurgitation is not visualized. Mild to moderate aortic valve  sclerosis/calcification is present, without any evidence of aortic stenosis.  5. The inferior vena cava is normal in size with greater than 50% respiratory variability, suggesting right atrial pressure of 3 mmHg. FINDINGS  Left Ventricle: Left ventricular ejection fraction, by estimation, is 60 to 65%. The left ventricle has normal function. The left ventricle has no regional wall motion abnormalities. The left ventricular internal cavity size was normal in size. There is  no left ventricular hypertrophy. Left ventricular diastolic parameters are consistent with age-related delayed relaxation (normal). Right Ventricle: The right ventricular size is normal. No increase in right ventricular wall thickness. Right ventricular systolic function is normal. There is mildly elevated pulmonary artery systolic pressure. The tricuspid regurgitant velocity is 2.86  m/s, and with an assumed right atrial pressure of 10 mmHg, the estimated right ventricular systolic pressure is 31.5 mmHg. Left Atrium: Left atrial size was normal in size. Right Atrium: Right atrial size was normal in size. Pericardium: There is no evidence of pericardial effusion. Mitral Valve: The mitral valve is normal in structure. Normal mobility of the mitral valve leaflets. Trivial mitral valve regurgitation. No evidence of mitral valve stenosis. Tricuspid Valve: The tricuspid valve is normal in structure. Tricuspid valve regurgitation is mild . No evidence of tricuspid stenosis. Aortic Valve: The aortic valve is tricuspid. Aortic valve regurgitation is not visualized. Mild to moderate aortic valve sclerosis/calcification is present, without any evidence of aortic stenosis. Pulmonic Valve: The pulmonic valve was normal in structure. Pulmonic valve regurgitation is not visualized. No evidence  of pulmonic stenosis. Aorta: The aortic root is normal in size and structure. Venous: The inferior vena cava is normal in size with greater than 50% respiratory  variability, suggesting right atrial pressure of 3 mmHg. IAS/Shunts: No atrial level shunt detected by color flow Doppler.  LEFT VENTRICLE PLAX 2D LVIDd:         4.21 cm  Diastology LVIDs:         2.71 cm  LV e' lateral:   10.70 cm/s LV PW:         1.05 cm  LV E/e' lateral: 8.0 LV IVS:        0.87 cm  LV e' medial:    6.74 cm/s LVOT diam:     1.80 cm  LV E/e' medial:  12.7 LVOT Area:     2.54 cm  RIGHT VENTRICLE RV S prime:     15.40 cm/s TAPSE (M-mode): 2.8 cm LEFT ATRIUM             Index       RIGHT ATRIUM          Index LA diam:        2.80 cm 1.56 cm/m  RA Area:     9.11 cm LA Vol (A2C):   63.9 ml 35.51 ml/m RA Volume:   16.40 ml 9.11 ml/m LA Vol (A4C):   42.6 ml 23.67 ml/m LA Biplane Vol: 54.6 ml 30.34 ml/m   AORTA Ao Root diam: 2.90 cm MITRAL VALVE               TRICUSPID VALVE MV Area (PHT): 3.53 cm    TR Peak grad:   32.7 mmHg MV Decel Time: 215 msec    TR Vmax:        286.00 cm/s MV E velocity: 85.90 cm/s MV A velocity: 59.20 cm/s  SHUNTS MV E/A ratio:  1.45        Systemic Diam: 1.80 cm Jenkins Rouge MD Electronically signed by Jenkins Rouge MD Signature Date/Time: 07/26/2019/12:09:47 PM    Final    CT HEAD CODE STROKE WO CONTRAST  Result Date: 07/25/2019 CLINICAL DATA:  Code stroke.  Slurred speech. EXAM: CT HEAD WITHOUT CONTRAST TECHNIQUE: Contiguous axial images were obtained from the base of the skull through the vertex without intravenous contrast. COMPARISON:  12/24/2018 FINDINGS: Brain: Age related atrophy. Chronic small-vessel ischemic changes of the cerebral hemispheric white matter. No sign of acute infarction, mass lesion, hemorrhage, hydrocephalus or extra-axial collection. Vascular: There is atherosclerotic calcification of the major vessels at the base of the brain. Skull: Negative Sinuses/Orbits: Clear sinuses.  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 Other: None ASPECTS ([place] Stroke Program Early CT Score) - Ganglionic level infarction (caudate, lentiform nuclei, internal capsule,  insula, M1-M3 cortex): 7 - Supraganglionic infarction (M4-M6 cortex): 3 Total score (0-10 with 10 being normal): 10 IMPRESSION: 1. No acute finding by CT. Atrophy and chronic small-vessel ischemic changes of the white matter. 2. ASPECTS is 10 3. These results were called by telephone at the time of interpretation on 07/25/2019 at 5:12 pm to provider [facility] ZAMMIT , who verbally acknowledged these results. Electronically Signed   By: Nelson Chimes M.D.   On: 07/25/2019 17:12     Microbiology: Recent Results (from the past 240 hour(s))  Culture, Urine     Status: Abnormal   Collection Time: 07/25/19  4:48 PM   Specimen: Urine, Random  Result Value Ref Range Status   Specimen Description   Final  URINE, RANDOM Performed at [facility], [address], [place], [place] [address]    Special Requests   Final    NONE Performed at [facility], [address] [place], [place] [address]    Culture 10,000 COLONIES/mL ESCHERICHIA COLI (A)  Final   Report Status 07/28/2019 FINAL  Final   Organism ID, Bacteria ESCHERICHIA COLI (A)  Final      Susceptibility   Escherichia coli - MIC*    AMPICILLIN >=32 RESISTANT Resistant     CEFAZOLIN 8 SENSITIVE Sensitive     CEFTRIAXONE <=0.25 SENSITIVE Sensitive     CIPROFLOXACIN <=0.25 SENSITIVE Sensitive     GENTAMICIN <=1 SENSITIVE Sensitive     IMIPENEM <=0.25 SENSITIVE Sensitive     NITROFURANTOIN <=16 SENSITIVE Sensitive     TRIMETH/SULFA <=20 SENSITIVE Sensitive     AMPICILLIN/SULBACTAM >=32 RESISTANT Resistant     PIP/TAZO 8 SENSITIVE Sensitive     * 10,000 COLONIES/mL ESCHERICHIA COLI  SARS Coronavirus 2 by RT PCR (hospital order, performed in [place] hospital lab) Nasopharyngeal Nasopharyngeal Swab     Status: None   Collection Time: 07/25/19 11:18 PM   Specimen: Nasopharyngeal Swab  Result Value Ref Range Status   SARS Coronavirus 2 NEGATIVE NEGATIVE Final    Comment: (NOTE) SARS-CoV-2 target nucleic acids are NOT DETECTED.  The  SARS-CoV-2 RNA is generally detectable in upper and lower respiratory specimens during the acute phase of infection. The lowest concentration of SARS-CoV-2 viral copies this assay can detect is 250 copies / mL. A negative result does not preclude SARS-CoV-2 infection and should not be used as the sole basis for treatment or other patient management decisions.  A negative result may occur with improper specimen collection / handling, submission of specimen other than nasopharyngeal swab, presence of viral mutation(s) within the areas targeted by this assay, and inadequate number of viral copies (<250 copies / mL). A negative result must be combined with clinical observations, patient history, and epidemiological information.  Fact Sheet for Patients:   StrictlyIdeas.no  Fact Sheet for Healthcare Providers: BankingDealers.co.za  This test is not yet approved or  cleared by the [place] FDA and has been authorized for detection and/or diagnosis of SARS-CoV-2 by FDA under an Emergency Use Authorization (EUA).  This EUA will remain in effect (meaning this test can be used) for the duration of the COVID-19 declaration under Section 564(b)(1) of the Act, 21 U.S.C. section 360bbb-3(b)(1), unless the authorization is terminated or revoked sooner.  Performed at [facility], [address], [place], [place] [address]   Culture, blood (routine x 2)     Status: None   Collection Time: 07/26/19  3:26 PM   Specimen: BLOOD RIGHT WRIST  Result Value Ref Range Status   Specimen Description BLOOD RIGHT WRIST  Final   Special Requests   Final    BOTTLES DRAWN AEROBIC AND ANAEROBIC Blood Culture adequate volume   Culture   Final    NO GROWTH 5 DAYS Performed at [facility], [address] [place], [place] [address]    Report Status 07/31/2019 FINAL  Final  Culture, Urine     Status: Abnormal   Collection Time: 07/29/19  9:39 AM   Specimen:  Urine, Clean Catch  Result Value Ref Range Status   Specimen Description   Final    URINE, CLEAN CATCH Performed at [facility], [address], [place], [place] [address]    Special Requests   Final    NONE Performed  at [facility], [place] [address] [place], [place] [address]    Culture 20,000 COLONIES/mL ENTEROCOCCUS FAECALIS (A)  Final   Report Status 08/01/2019 FINAL  Final   Organism ID, Bacteria ENTEROCOCCUS FAECALIS (A)  Final      Susceptibility   Enterococcus faecalis - MIC*    AMPICILLIN <=2 SENSITIVE Sensitive     NITROFURANTOIN <=16 SENSITIVE Sensitive     VANCOMYCIN 1 SENSITIVE Sensitive     * 20,000 COLONIES/mL ENTEROCOCCUS FAECALIS  SARS Coronavirus 2 by RT PCR (hospital order, performed in [place] hospital lab) Nasopharyngeal Nasopharyngeal Swab     Status: None   Collection Time: 08/01/19 12:02 PM   Specimen: Nasopharyngeal Swab  Result Value Ref Range Status   SARS Coronavirus 2 NEGATIVE NEGATIVE Final    Comment: (NOTE) SARS-CoV-2 target nucleic acids are NOT DETECTED.  The SARS-CoV-2 RNA is generally detectable in upper and lower respiratory specimens during the acute phase of infection. The lowest concentration of SARS-CoV-2 viral copies this assay can detect is 250 copies / mL. A negative result does not preclude SARS-CoV-2 infection and should not be used as the sole basis for treatment or other patient management decisions.  A negative result may occur with improper specimen collection / handling, submission of specimen other than nasopharyngeal swab, presence of viral mutation(s) within the areas targeted by this assay, and inadequate number of viral copies (<250 copies / mL). A negative result must be combined with clinical observations, patient history, and epidemiological information.  Fact Sheet for Patients:   StrictlyIdeas.no  Fact Sheet for Healthcare  Providers: BankingDealers.co.za  This test is not yet approved or  cleared by the [place] FDA and has been authorized for detection and/or diagnosis of SARS-CoV-2 by FDA under an Emergency Use Authorization (EUA).  This EUA will remain in effect (meaning this test can be used) for the duration of the COVID-19 declaration under Section 564(b)(1) of the Act, 21 U.S.C. section 360bbb-3(b)(1), unless the authorization is terminated or revoked sooner.  Performed at [facility], [address], [place], [place] [address]      Labs: Basic Metabolic Panel: Recent Labs  Lab 07/27/19 [phone] 07/27/19 0629 07/28/19 0650 07/28/19 0650 07/29/19 0630 07/29/19 0630 07/30/19 1311 08/01/19 0428  NA 138  --  142  --  138  --  135 137  K 4.0   < > 3.9   < > 3.7   < > 3.7 3.4*  CL 103  --  101  --  100  --  97* 102  CO2 25  --  26  --  27  --  25 27  GLUCOSE 148*  --  109*  --  178*  --  160* 178*  BUN 13  --  9  --  10  --  12 19  CREATININE 0.98  --  0.95  --  0.89  --  0.88 0.99  CALCIUM 8.7*  --  9.2  --  9.4  --  9.1 8.8*  MG 1.4*  --   --   --   --   --  1.5* 1.6*  PHOS  --   --   --   --   --   --  2.9  --    < > = values in this interval not displayed.   Liver Function Tests: Recent Labs  Lab 07/27/19 0629 07/30/19 1311 08/01/19 0428  AST 45* 31 36  ALT 18 25 36  ALKPHOS 38 61 70  BILITOT 0.9 1.1 0.6  PROT 6.2* 7.0 6.0*  ALBUMIN 3.5 3.4* 2.6*   No results for input(s): LIPASE, AMYLASE in the last 168 hours. Recent Labs  Lab 07/31/19 1312  AMMONIA 24   CBC: Recent Labs  Lab 07/27/19 0629 07/28/19 0650 07/29/19 0630 07/30/19 1311 08/01/19 0428  WBC 7.8 7.3 8.8 8.6 7.3  NEUTROABS  --  5.2  --  6.6  --   HGB 9.9* 10.8* 11.1* 11.5* 10.1*  HCT 32.6* 35.8* 36.1 36.7 33.0*  MCV 91.1 90.2 89.1 89.5 90.2  PLT 157 183 194 220 225   Cardiac Enzymes: No results for input(s): CKTOTAL, CKMB, CKMBINDEX, TROPONINI in the last 168  hours. BNP: Invalid input(s): POCBNP CBG: Recent Labs  Lab 07/31/19 1642 07/31/19 2124 08/01/19 0727 08/01/19 1120 08/01/19 1651  GLUCAP 145* 186* 157* 162* 150*    Time coordinating discharge:  36 minutes  Signed:  Orson Eva, DO [facility] Pager: [phone] 08/02/2019, 7:00 AM

## 2019-08-03 ENCOUNTER — Ambulatory Visit (INDEPENDENT_AMBULATORY_CARE_PROVIDER_SITE_OTHER): Payer: Medicare Other

## 2019-08-03 ENCOUNTER — Other Ambulatory Visit: Payer: Self-pay

## 2019-08-03 DIAGNOSIS — I499 Cardiac arrhythmia, unspecified: Secondary | ICD-10-CM

## 2019-08-04 ENCOUNTER — Telehealth: Payer: Self-pay

## 2019-08-04 NOTE — Telephone Encounter (Signed)
NOTES ON FILE FROM [facility]

## 2019-08-30 ENCOUNTER — Encounter: Payer: Self-pay | Admitting: Cardiology

## 2019-08-30 ENCOUNTER — Ambulatory Visit: Payer: Medicare Other | Admitting: Cardiology

## 2019-08-30 ENCOUNTER — Other Ambulatory Visit: Payer: Self-pay

## 2019-08-30 VITALS — BP 124/64 | HR 72 | Ht 62.0 in | Wt 162.2 lb

## 2019-08-30 DIAGNOSIS — I639 Cerebral infarction, unspecified: Secondary | ICD-10-CM | POA: Diagnosis not present

## 2019-08-30 DIAGNOSIS — I10 Essential (primary) hypertension: Secondary | ICD-10-CM

## 2019-08-30 DIAGNOSIS — E785 Hyperlipidemia, unspecified: Secondary | ICD-10-CM

## 2019-08-30 DIAGNOSIS — I248 Other forms of acute ischemic heart disease: Secondary | ICD-10-CM | POA: Diagnosis not present
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
## 2019-08-30 NOTE — Patient Instructions (Addendum)
Medication Instructions:  Your physician recommends that you continue on your current medications as directed. Please refer to the Current Medication list given to you today.  Follow-Up: At [facility], you and your health needs are our priority.  As part of our continuing mission to provide you with exceptional heart care, we have created designated Provider Care Teams.  These Care Teams include your primary Cardiologist (physician) and Advanced Practice Providers (APPs -  Physician Assistants and Nurse Practitioners) who all work together to provide you with the care you need, when you need it.  We recommend signing up for the patient portal called "MyChart".  Sign up information is provided on this After Visit Summary.  MyChart is used to connect with patients for Virtual Visits (Telemedicine).  Patients are able to view lab/test results, encounter notes, upcoming appointments, etc.  Non-urgent messages can be sent to your provider as well.   To learn more about what you can do with MyChart, go to [email or web address].    Your next appointment:   3 month(s)  The format for your next appointment:   Virtual Visit or in person   Provider:   Oswaldo Milian, MD

## 2019-10-04 ENCOUNTER — Telehealth: Payer: Self-pay | Admitting: Cardiology

## 2019-10-04 NOTE — Telephone Encounter (Signed)
Patient's daughter is calling and wanted to know if they can have a virtual appt with the patient on 12/01/19 at 11:40. Please call back to discuss.

## 2019-10-05 NOTE — Telephone Encounter (Signed)
Daughter called back and also ask about the results from the patient's recent event monitor. Please advise

## 2019-10-05 NOTE — Telephone Encounter (Signed)
Spoke to daughter (EC)-advised ok to change appt to VV if needed and results per Dr. Carles Collet (no significant arrhythmias).   Daughter aware and verbalized understanding.
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
## 2019-12-01 ENCOUNTER — Telehealth (INDEPENDENT_AMBULATORY_CARE_PROVIDER_SITE_OTHER): Payer: Medicare Other | Admitting: Cardiology

## 2019-12-01 ENCOUNTER — Encounter: Payer: Self-pay | Admitting: Cardiology

## 2019-12-01 VITALS — BP 114/82 | HR 72 | Ht 62.0 in | Wt 158.0 lb

## 2019-12-01 DIAGNOSIS — I639 Cerebral infarction, unspecified: Secondary | ICD-10-CM

## 2019-12-01 DIAGNOSIS — I10 Essential (primary) hypertension: Secondary | ICD-10-CM

## 2019-12-01 DIAGNOSIS — E785 Hyperlipidemia, unspecified: Secondary | ICD-10-CM | POA: Diagnosis not present

## 2019-12-01 DIAGNOSIS — I248 Other forms of acute ischemic heart disease: Secondary | ICD-10-CM

## 2019-12-01 NOTE — Patient Instructions (Signed)

## 2020-01-30 ENCOUNTER — Other Ambulatory Visit: Payer: Self-pay | Admitting: Family Medicine

## 2020-01-30 DIAGNOSIS — Z1231 Encounter for screening mammogram for malignant neoplasm of breast: Secondary | ICD-10-CM

## 2020-03-14 ENCOUNTER — Ambulatory Visit: Payer: Medicare Other

## 2020-04-30 ENCOUNTER — Other Ambulatory Visit: Payer: Self-pay

## 2020-04-30 ENCOUNTER — Ambulatory Visit
Admission: RE | Admit: 2020-04-30 | Discharge: 2020-04-30 | Disposition: A | Discharge status: Home / Self Care | Payer: Medicare Other | Source: Ambulatory Visit | Attending: Family Medicine | Admitting: Family Medicine

## 2020-04-30 ENCOUNTER — Inpatient Hospital Stay: Admission: RE | Admit: 2020-04-30 | Payer: Medicare Other | Source: Ambulatory Visit

## 2020-04-30 DIAGNOSIS — Z1231 Encounter for screening mammogram for malignant neoplasm of breast: Secondary | ICD-10-CM

## 2021-03-20 ENCOUNTER — Other Ambulatory Visit: Payer: Self-pay | Admitting: Family Medicine

## 2021-03-20 DIAGNOSIS — Z1231 Encounter for screening mammogram for malignant neoplasm of breast: Secondary | ICD-10-CM

## 2021-05-01 ENCOUNTER — Ambulatory Visit
Admission: RE | Admit: 2021-05-01 | Discharge: 2021-05-01 | Disposition: A | Discharge status: Home / Self Care | Payer: Medicare Other | Source: Ambulatory Visit | Attending: Family Medicine | Admitting: Family Medicine

## 2021-05-01 DIAGNOSIS — Z1231 Encounter for screening mammogram for malignant neoplasm of breast: Secondary | ICD-10-CM

## 2021-10-08 IMAGING — MG DIGITAL SCREENING BILAT W/ TOMO W/ CAD
8 series · 8 of 24 positions shown · non-contrast
Comparison: Previous exam(s).

CLINICAL DATA: Screening. Two technologists were required to obtain
the images. The patient is in a wheelchair and her mobility is
limited.

EXAM:
DIGITAL SCREENING BILATERAL MAMMOGRAM WITH TOMO AND CAD

[R CC synth-2D]
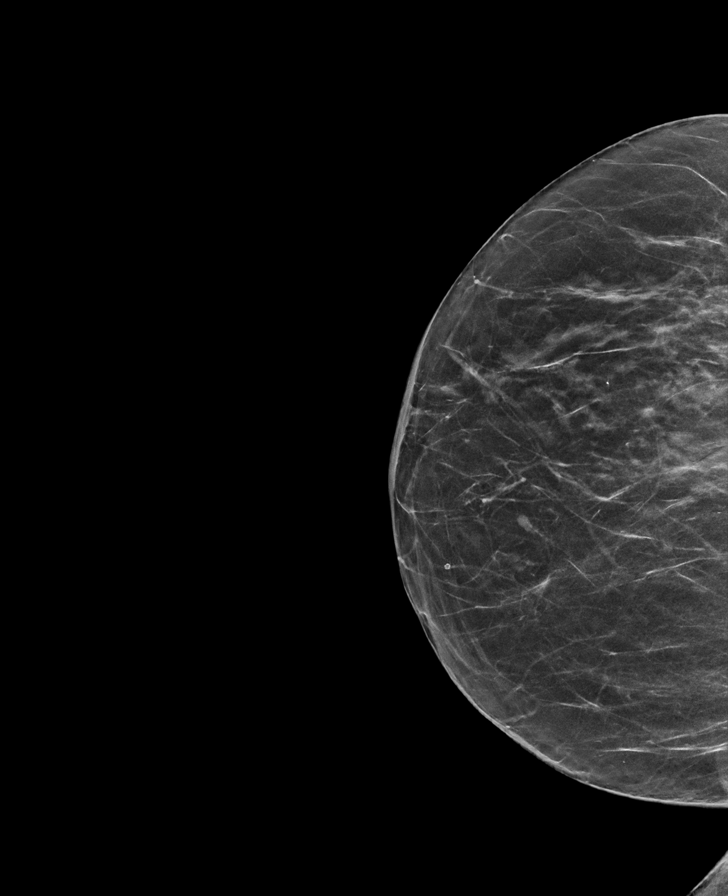

[L MLO synth-2D]
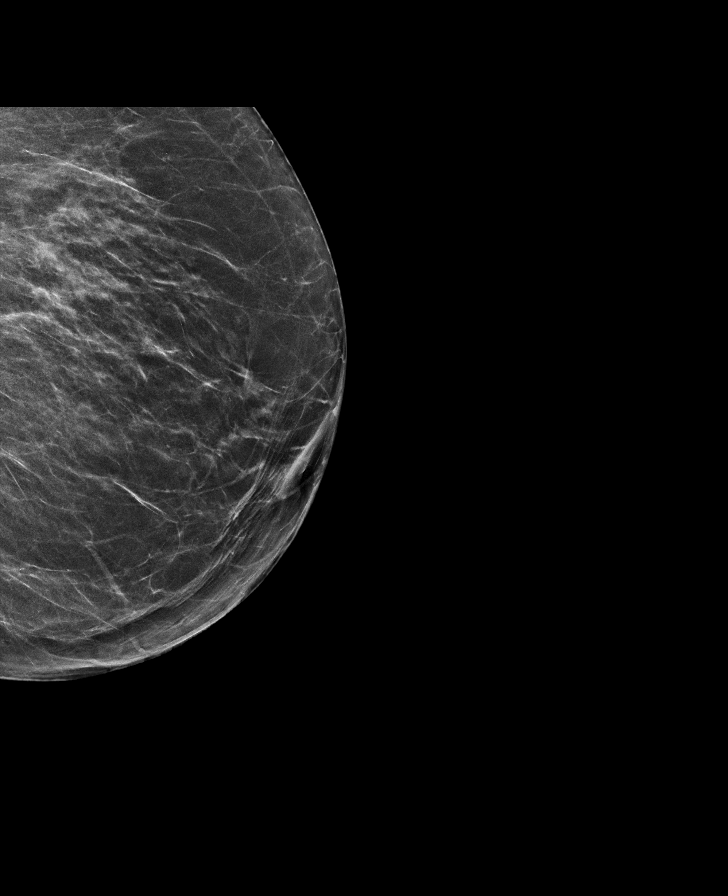

[R MLO synth-2D]
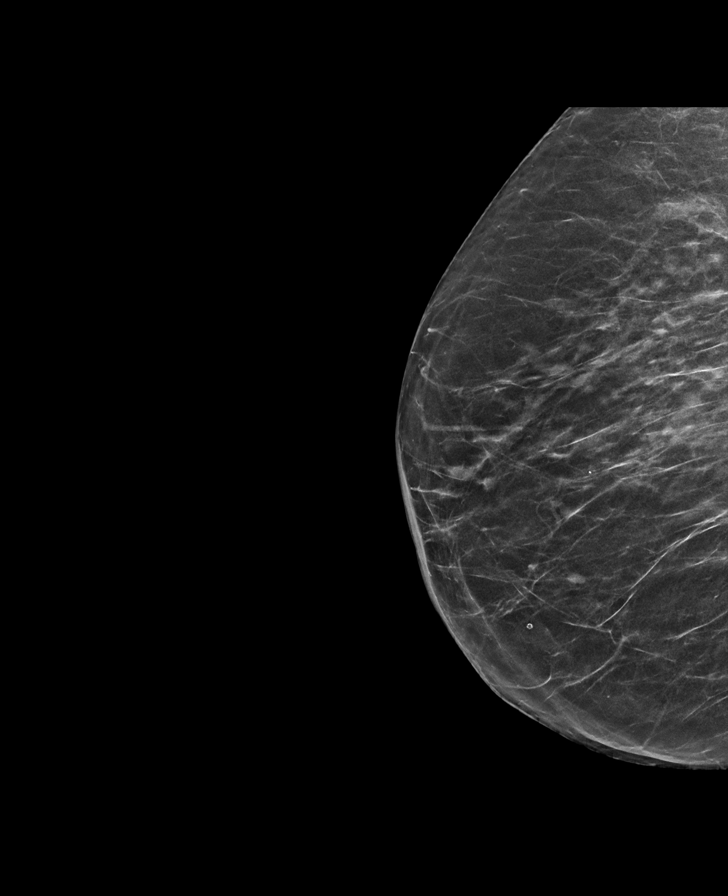

[L CC synth-2D]
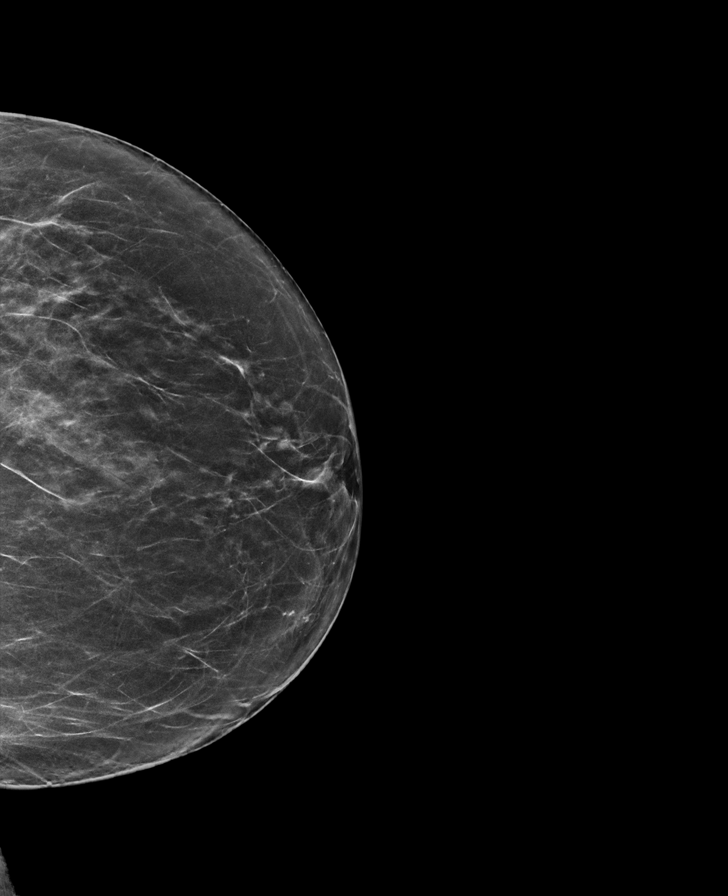

[L MLO tomo · tomo slice 30/59.0]
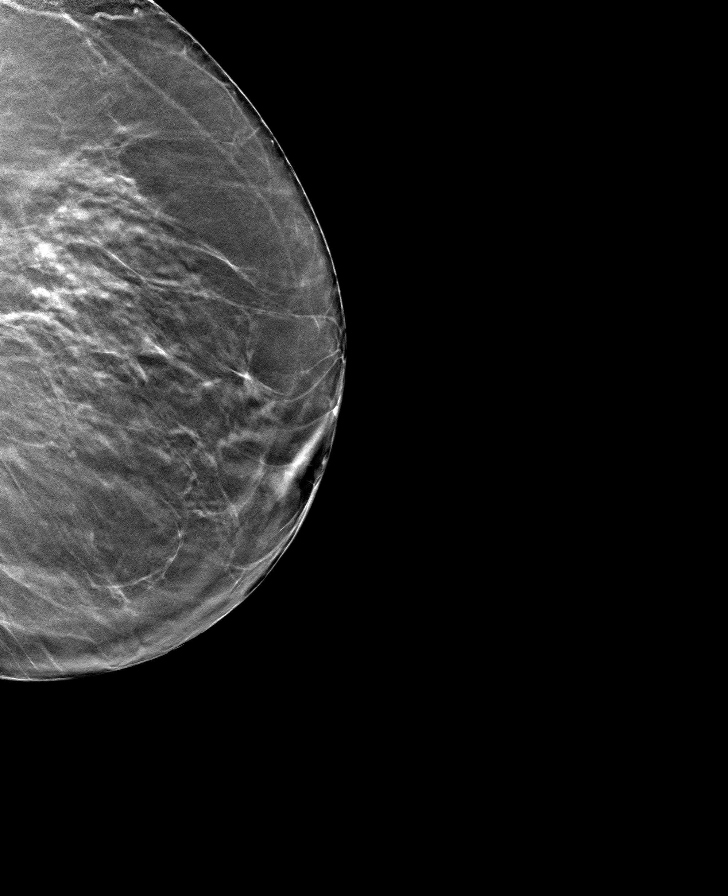

[L CC tomo · tomo slice 31/61.0]
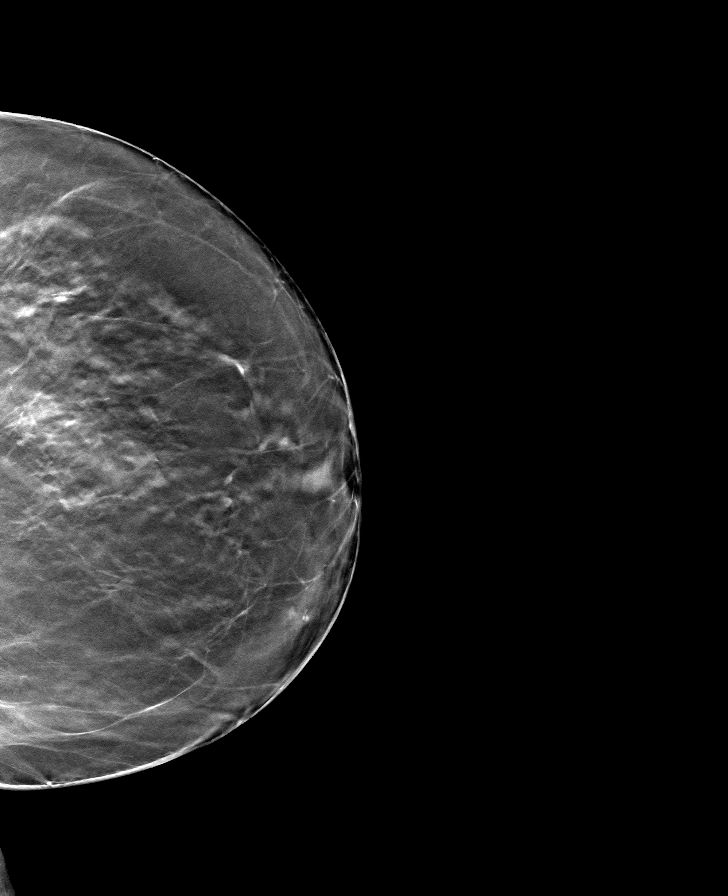

[R CC tomo · tomo slice 27/53.0]
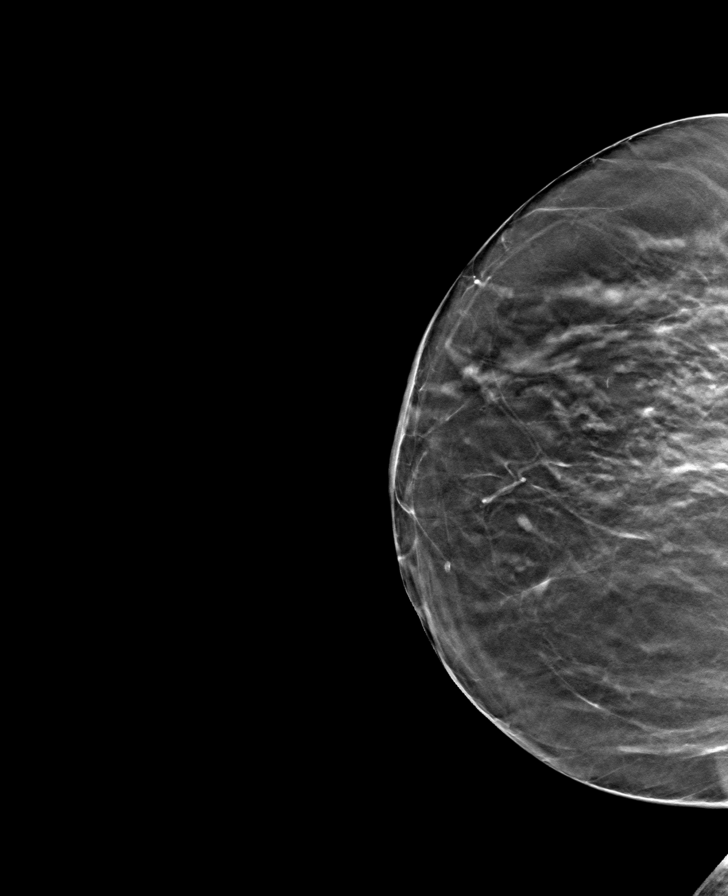

[R MLO tomo · tomo slice 27/52.0]
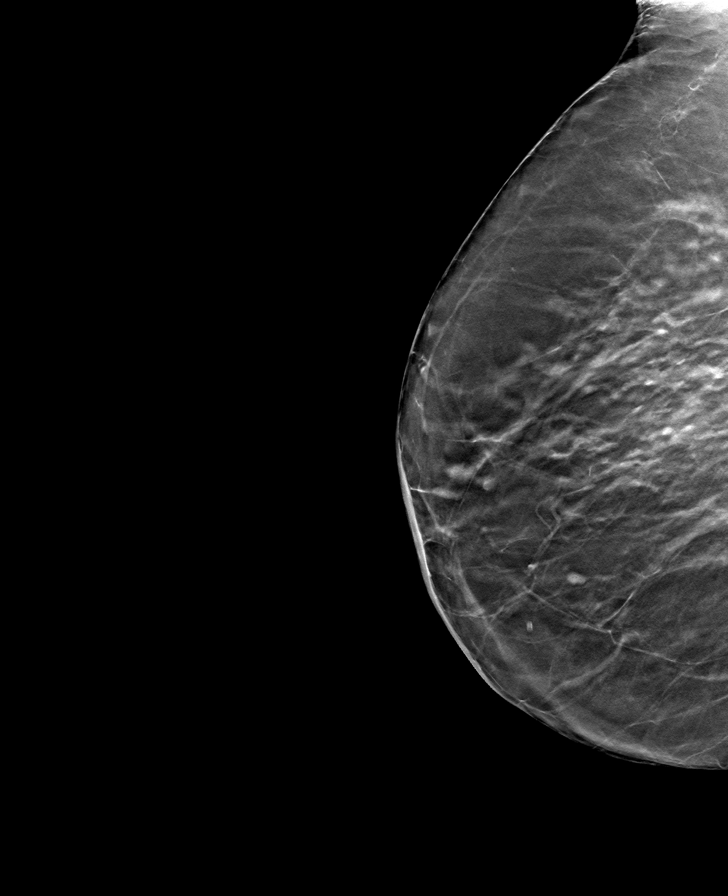

[8 of 24 positions shown; findings below may reference images not displayed]

ACR Breast Density Category b: There are scattered areas of
fibroglandular density.
FINDINGS: There are no findings suspicious for malignancy. Some of the
posterior tissue of both breasts is excluded from the imaging field
due to limited patient mobility. Images are to the best of the
patient's and technologists' ability. Images were processed with
CAD.
IMPRESSION: No mammographic evidence of malignancy. A result letter of this
screening mammogram will be mailed directly to the patient.

RECOMMENDATION:
Screening mammogram in one year. (Code:LP-D-E2H)

BI-RADS CATEGORY  1: Negative.

## 2022-03-24 ENCOUNTER — Other Ambulatory Visit: Payer: Self-pay | Admitting: Family Medicine

## 2022-03-24 DIAGNOSIS — Z1231 Encounter for screening mammogram for malignant neoplasm of breast: Secondary | ICD-10-CM

## 2022-05-07 ENCOUNTER — Ambulatory Visit
Admission: RE | Admit: 2022-05-07 | Discharge: 2022-05-07 | Disposition: A | Discharge status: Home / Self Care | Payer: Medicare Other | Source: Ambulatory Visit | Attending: Family Medicine | Admitting: Family Medicine

## 2022-05-07 DIAGNOSIS — Z1231 Encounter for screening mammogram for malignant neoplasm of breast: Secondary | ICD-10-CM

## 2022-05-14 ENCOUNTER — Other Ambulatory Visit: Payer: Self-pay | Admitting: Family Medicine

## 2022-05-14 DIAGNOSIS — Z1231 Encounter for screening mammogram for malignant neoplasm of breast: Secondary | ICD-10-CM

## 2023-05-10 ENCOUNTER — Ambulatory Visit: Payer: Medicare Other
